# Patient Record
Sex: Male | Born: 1960 | Race: White | Hispanic: No | Smoking: Former smoker
Health system: Southern US, Community
[De-identification: ages and names within clinical notes are randomized; demographics above are authoritative.]

## PROBLEM LIST (undated history)

## (undated) DIAGNOSIS — Z973 Presence of spectacles and contact lenses: Secondary | ICD-10-CM

## (undated) DIAGNOSIS — E7800 Pure hypercholesterolemia, unspecified: Secondary | ICD-10-CM

## (undated) DIAGNOSIS — K219 Gastro-esophageal reflux disease without esophagitis: Secondary | ICD-10-CM

## (undated) DIAGNOSIS — M069 Rheumatoid arthritis, unspecified: Secondary | ICD-10-CM

## (undated) DIAGNOSIS — I219 Acute myocardial infarction, unspecified: Secondary | ICD-10-CM

## (undated) DIAGNOSIS — I10 Essential (primary) hypertension: Secondary | ICD-10-CM

## (undated) DIAGNOSIS — M659 Synovitis and tenosynovitis, unspecified: Secondary | ICD-10-CM

## (undated) DIAGNOSIS — M2560 Stiffness of unspecified joint, not elsewhere classified: Secondary | ICD-10-CM

## (undated) DIAGNOSIS — E119 Type 2 diabetes mellitus without complications: Secondary | ICD-10-CM

## (undated) DIAGNOSIS — I2510 Atherosclerotic heart disease of native coronary artery without angina pectoris: Secondary | ICD-10-CM

## (undated) DIAGNOSIS — F410 Panic disorder [episodic paroxysmal anxiety] without agoraphobia: Secondary | ICD-10-CM

## (undated) DIAGNOSIS — G4730 Sleep apnea, unspecified: Secondary | ICD-10-CM

## (undated) HISTORY — DX: Atherosclerotic heart disease of native coronary artery without angina pectoris: I25.10

## (undated) HISTORY — DX: Rheumatoid arthritis, unspecified: M06.9

## (undated) HISTORY — PX: TENDON REPAIR: SHX5111

---
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
## 2000-02-28 HISTORY — PX: CARDIAC CATHETERIZATION: SHX172

## 2007-02-26 ENCOUNTER — Ambulatory Visit (HOSPITAL_COMMUNITY): Admission: RE | Admit: 2007-02-26 | Discharge: 2007-02-26 | Payer: Self-pay | Admitting: Family Medicine

## 2008-03-10 ENCOUNTER — Ambulatory Visit: Payer: Self-pay | Admitting: Cardiology

## 2008-03-10 ENCOUNTER — Inpatient Hospital Stay (HOSPITAL_COMMUNITY): Admission: AD | Admit: 2008-03-10 | Discharge: 2008-03-13 | Payer: Self-pay | Admitting: Neurology

## 2008-03-11 ENCOUNTER — Encounter (INDEPENDENT_AMBULATORY_CARE_PROVIDER_SITE_OTHER): Payer: Self-pay | Admitting: Neurology

## 2010-03-20 ENCOUNTER — Encounter: Payer: Self-pay | Admitting: Family Medicine

## 2010-06-13 LAB — HEMOGLOBIN A1C
Hgb A1c MFr Bld: 6.7 % — ABNORMAL HIGH (ref 4.6–6.1)
Mean Plasma Glucose: 146 mg/dL

## 2010-06-13 LAB — CARDIAC PANEL(CRET KIN+CKTOT+MB+TROPI)
Relative Index: INVALID (ref 0.0–2.5)
Relative Index: INVALID (ref 0.0–2.5)
Total CK: 88 U/L (ref 7–232)
Total CK: 95 U/L (ref 7–232)
Troponin I: 0.02 ng/mL (ref 0.00–0.06)
Troponin I: <0.01 ng/mL (ref 0.00–0.06)

## 2010-06-13 LAB — CBC
HCT: 36.6 % — ABNORMAL LOW (ref 39.0–52.0)
HCT: 41.1 % (ref 39.0–52.0)
Hemoglobin: 12.4 g/dL — ABNORMAL LOW (ref 13.0–17.0)
MCHC: 33.6 g/dL (ref 30.0–36.0)
MCV: 90.7 fL (ref 78.0–100.0)
RBC: 4.53 MIL/uL (ref 4.22–5.81)
RDW: 13.1 % (ref 11.5–15.5)
WBC: 11.1 10*3/uL — ABNORMAL HIGH (ref 4.0–10.5)

## 2010-06-13 LAB — URINALYSIS, ROUTINE W REFLEX MICROSCOPIC
Bilirubin Urine: NEGATIVE
Glucose, UA: NEGATIVE mg/dL
Ketones, ur: NEGATIVE mg/dL
Leukocytes, UA: NEGATIVE
Leukocytes, UA: NEGATIVE
Protein, ur: NEGATIVE mg/dL
Specific Gravity, Urine: 1.019 (ref 1.005–1.030)
Urobilinogen, UA: 0.2 mg/dL (ref 0.0–1.0)
pH: 6 (ref 5.0–8.0)

## 2010-06-13 LAB — COMPREHENSIVE METABOLIC PANEL
ALT: 25 U/L (ref 0–53)
ALT: 32 U/L (ref 0–53)
AST: 20 U/L (ref 0–37)
Albumin: 3.3 g/dL — ABNORMAL LOW (ref 3.5–5.2)
BUN: 8 mg/dL (ref 6–23)
CO2: 25 mEq/L (ref 19–32)
Calcium: 7.3 mg/dL — ABNORMAL LOW (ref 8.4–10.5)
Calcium: 9.1 mg/dL (ref 8.4–10.5)
Chloride: 104 mEq/L (ref 96–112)
Chloride: 111 mEq/L (ref 96–112)
Creatinine, Ser: 0.62 mg/dL (ref 0.4–1.5)
Creatinine, Ser: 0.63 mg/dL (ref 0.4–1.5)
GFR calc non Af Amer: >60 mL/min (ref >60)
GFR calc non Af Amer: >60 mL/min (ref >60)
Potassium: 3.6 mEq/L (ref 3.5–5.1)
Sodium: 139 mEq/L (ref 135–145)
Total Bilirubin: 0.4 mg/dL (ref 0.3–1.2)
Total Bilirubin: 0.6 mg/dL (ref 0.3–1.2)
Total Protein: 5.7 g/dL — ABNORMAL LOW (ref 6.0–8.3)

## 2010-06-13 LAB — GLUCOSE, CAPILLARY
Glucose-Capillary: 115 mg/dL — ABNORMAL HIGH (ref 70–99)
Glucose-Capillary: 117 mg/dL — ABNORMAL HIGH (ref 70–99)
Glucose-Capillary: 118 mg/dL — ABNORMAL HIGH (ref 70–99)
Glucose-Capillary: 130 mg/dL — ABNORMAL HIGH (ref 70–99)
Glucose-Capillary: 160 mg/dL — ABNORMAL HIGH (ref 70–99)
Glucose-Capillary: 179 mg/dL — ABNORMAL HIGH (ref 70–99)

## 2010-06-13 LAB — DIFFERENTIAL
Basophils Relative: 0 % (ref 0–1)
Lymphocytes Relative: 32 % (ref 12–46)
Lymphs Abs: 3.5 10*3/uL (ref 0.7–4.0)
Neutro Abs: 6.6 10*3/uL (ref 1.7–7.7)
Neutrophils Relative %: 60 % (ref 43–77)

## 2010-06-13 LAB — PROTIME-INR
INR: 1 (ref 0.00–1.49)
INR: 1.1 (ref 0.00–1.49)
Prothrombin Time: 14.5 seconds (ref 11.6–15.2)

## 2010-06-13 LAB — BASIC METABOLIC PANEL
BUN: 10 mg/dL (ref 6–23)
Chloride: 102 mEq/L (ref 96–112)
Creatinine, Ser: 0.68 mg/dL (ref 0.4–1.5)
GFR calc non Af Amer: >60 mL/min (ref >60)
Potassium: 4 mEq/L (ref 3.5–5.1)
Sodium: 136 mEq/L (ref 135–145)

## 2010-06-13 LAB — APTT: aPTT: 28 seconds (ref 24–37)

## 2010-06-13 LAB — URINE MICROSCOPIC-ADD ON

## 2010-06-13 LAB — LIPID PANEL
LDL Cholesterol: 124 mg/dL — ABNORMAL HIGH (ref 0–99)
Total CHOL/HDL Ratio: 8.3 RATIO
VLDL: 50 mg/dL — ABNORMAL HIGH (ref 0–40)

## 2010-06-13 LAB — RAPID URINE DRUG SCREEN, HOSP PERFORMED: Tetrahydrocannabinol: NOT DETECTED

## 2010-06-13 LAB — TROPONIN I: Troponin I: 0.01 ng/mL (ref 0.00–0.06)

## 2010-06-13 LAB — CK TOTAL AND CKMB (NOT AT ARMC): CK, MB: 1.7 ng/mL (ref 0.3–4.0)

## 2010-06-13 LAB — POCT CARDIAC MARKERS
CKMB, poc: 1.1 ng/mL (ref 1.0–8.0)
Myoglobin, poc: 30.2 ng/mL (ref 12–200)
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
## 2013-02-20 ENCOUNTER — Emergency Department (HOSPITAL_COMMUNITY): Payer: BC Managed Care – PPO

## 2013-02-20 ENCOUNTER — Encounter (HOSPITAL_COMMUNITY): Payer: Self-pay | Admitting: Emergency Medicine

## 2013-02-20 ENCOUNTER — Emergency Department (HOSPITAL_COMMUNITY)
Admission: EM | Admit: 2013-02-20 | Discharge: 2013-02-20 | Disposition: A | Discharge status: Home / Self Care | Payer: BC Managed Care – PPO | Attending: Emergency Medicine | Admitting: Emergency Medicine

## 2013-02-20 DIAGNOSIS — R209 Unspecified disturbances of skin sensation: Secondary | ICD-10-CM | POA: Insufficient documentation

## 2013-02-20 DIAGNOSIS — I10 Essential (primary) hypertension: Secondary | ICD-10-CM | POA: Insufficient documentation

## 2013-02-20 DIAGNOSIS — M7989 Other specified soft tissue disorders: Secondary | ICD-10-CM

## 2013-02-20 HISTORY — DX: Essential (primary) hypertension: I10

## 2013-02-20 MED ORDER — IBUPROFEN 800 MG PO TABS
800.0000 mg | ORAL_TABLET | Freq: Once | ORAL | Status: AC
  Administered 2013-02-20: 800 mg via ORAL
  Filled 2013-02-20: qty 1

## 2013-02-20 MED ORDER — HYDROCODONE-ACETAMINOPHEN 5-325 MG PO TABS: 1.0000 | ORAL_TABLET | ORAL | Status: DC | PRN

## 2013-02-20 NOTE — ED Provider Notes (Signed)
CSN: [id number]     Arrival date & time 02/20/13  4098 History   First MD Initiated Contact with Patient 02/20/13 [phone]     Chief Complaint  Patient presents with  . Arm Swelling   (Consider location/radiation/quality/duration/timing/severity/associated sxs/prior Treatment) HPI Comments: 52 year old male presents with 2 weeks of right hand swelling. The swelling is in his thenar eminence. He states there's been no trauma, redness, recent infection, fevers, or obvious cause. There is no weakness. He states he feels numbness in the tips of his thumb, index finger, and middle finger. He states he's been trying Advil and Aleve for the pain and swelling his stay. He states he has not had any chest pain or shortness of breath. The pain was worse this morning however has been and seem to radiate from his hand up to her shoulder. Denies any headache. The pain is currently severe.   Past Medical History  Diagnosis Date  . Hypertension    No past surgical history on file. No family history on file. History  Substance Use Topics  . Smoking status: Not on file  . Smokeless tobacco: Not on file  . Alcohol Use: Not on file    Review of Systems  Respiratory: Negative for shortness of breath.   Cardiovascular: Negative for chest pain.  Gastrointestinal: Negative for vomiting.  Musculoskeletal:       Right hand swelling  Skin: Negative for rash and wound.  Neurological: Positive for numbness. Negative for weakness.  All other systems reviewed and are negative.    Allergies  Review of patient's allergies indicates no known allergies.  Home Medications  No current outpatient prescriptions on file. BP 145/92  Pulse 68  Temp(Src) 98.3 F (36.8 C) (Oral)  Resp 18  Ht 5\' 6"  (1.676 m)  Wt 240 lb (108.863 kg)  BMI 38.76 kg/m2  SpO2 91% Physical Exam  Nursing note and vitals reviewed. Constitutional: He is oriented to person, place, and time. He appears well-developed and well-nourished. No  distress.  HENT:  Head: Normocephalic and atraumatic.  Right Ear: External ear normal.  Left Ear: External ear normal.  Nose: Nose normal.  Eyes: Right eye exhibits no discharge. Left eye exhibits no discharge.  Neck: Neck supple.  Cardiovascular: Normal rate, regular rhythm, normal heart sounds and intact distal pulses.   Pulses:      Radial pulses are 2+ on the right side, and 2+ on the left side.  Pulmonary/Chest: Effort normal and breath sounds normal.  Abdominal: Soft. There is no tenderness.  Musculoskeletal: He exhibits no edema.       Hands: Good range of motion of wrist, and all fingers. Able to make a fist with pain. Normal capillary refill. Patient is able to flex and extend each finger against resistance without difficulty.  Neurological: He is alert and oriented to person, place, and time.  Skin: Skin is warm and dry.    ED Course  Procedures (including critical care time) Labs Review Labs Reviewed - No data to display Imaging Review Dg Hand Complete Right  02/20/2013   CLINICAL DATA:  Right hand pain  EXAM: RIGHT HAND - COMPLETE 3+ VIEW  COMPARISON:  None.  FINDINGS: No acute fracture.  No dislocation.  IMPRESSION: No acute bony pathology.   Electronically Signed   By: Maryclare Bean M.D.   On: 02/20/2013 10:35    EKG Interpretation   None       MDM   1. Hand swelling    Patient with  nonspecific right hand swelling. No signs of infection such as fever, induration, or erythema. No trauma. No fractures noted. His compartments are soft and he has full range of motion of his fingers with no signs of tendon or ligament instability. No localized joint swelling or effusion. He has no swelling past his thenar eminence. This is not of swelling across his any joints, I do not feel there is any risk for blood clot. Will treat with symptom control and f/u with PCP.     Audree Camel, MD 02/20/13 [phone]

## 2013-02-20 NOTE — ED Notes (Signed)
Pt c/o right hand swelling and numbness x1 week. Pt states this morning the symptoms briefly radiated up his right arm to his shoulder. Pt denies chest pain, SOB, fever. Pt has limited movement with right thumb and fingers due to swelling and pain.

## 2013-03-30 DIAGNOSIS — M659 Synovitis and tenosynovitis, unspecified: Secondary | ICD-10-CM

## 2013-03-30 DIAGNOSIS — M65939 Unspecified synovitis and tenosynovitis, unspecified forearm: Secondary | ICD-10-CM

## 2013-03-30 HISTORY — DX: Unspecified synovitis and tenosynovitis, unspecified forearm: M65.939

## 2013-03-30 HISTORY — DX: Synovitis and tenosynovitis, unspecified: M65.9

## 2013-04-04 ENCOUNTER — Encounter (HOSPITAL_BASED_OUTPATIENT_CLINIC_OR_DEPARTMENT_OTHER): Payer: Self-pay | Admitting: *Deleted

## 2013-04-04 DIAGNOSIS — M2560 Stiffness of unspecified joint, not elsewhere classified: Secondary | ICD-10-CM

## 2013-04-04 HISTORY — DX: Stiffness of unspecified joint, not elsewhere classified: M25.60

## 2013-04-04 NOTE — Progress Notes (Signed)
04/04/13 1509  OBSTRUCTIVE SLEEP APNEA  Have you ever been diagnosed with sleep apnea through a sleep study? No  Do you snore loudly (loud enough to be heard through closed doors)?  1  Do you often feel tired, fatigued, or sleepy during the daytime? 1  Has anyone observed you stop breathing during your sleep? 0  Do you have, or are you being treated for high blood pressure? 1  BMI more than 35 kg/m2? 1  Age over 52 years old? 1  Gender: 1  Obstructive Sleep Apnea Score 6  Score 4 or greater  Results sent to PCP (Dr. Elfredia Nevins)

## 2013-04-04 NOTE — Pre-Procedure Instructions (Signed)
To come for BMET and EKG 

## 2013-04-07 ENCOUNTER — Encounter (HOSPITAL_BASED_OUTPATIENT_CLINIC_OR_DEPARTMENT_OTHER)
Admission: RE | Admit: 2013-04-07 | Discharge: 2013-04-07 | Disposition: A | Discharge status: Home / Self Care | Payer: BC Managed Care – PPO | Source: Ambulatory Visit | Attending: Orthopedic Surgery | Admitting: Orthopedic Surgery

## 2013-04-07 ENCOUNTER — Other Ambulatory Visit: Payer: Self-pay | Admitting: Orthopedic Surgery

## 2013-04-07 LAB — BASIC METABOLIC PANEL
BUN: 11 mg/dL (ref 6–23)
CALCIUM: 8.9 mg/dL (ref 8.4–10.5)
CO2: 23 mEq/L (ref 19–32)
Chloride: 100 mEq/L (ref 96–112)
Creatinine, Ser: 0.64 mg/dL (ref 0.50–1.35)
Glucose, Bld: 292 mg/dL — ABNORMAL HIGH (ref 70–99)
POTASSIUM: 4.2 meq/L (ref 3.7–5.3)
SODIUM: 140 meq/L (ref 137–147)

## 2013-04-08 ENCOUNTER — Ambulatory Visit (HOSPITAL_BASED_OUTPATIENT_CLINIC_OR_DEPARTMENT_OTHER)
Admission: RE | Admit: 2013-04-08 | Discharge: 2013-04-08 | Disposition: A | Discharge status: Home / Self Care | Payer: BC Managed Care – PPO | Source: Ambulatory Visit | Attending: Orthopedic Surgery | Admitting: Orthopedic Surgery

## 2013-04-08 ENCOUNTER — Encounter (HOSPITAL_BASED_OUTPATIENT_CLINIC_OR_DEPARTMENT_OTHER): Payer: Self-pay | Admitting: Orthopedic Surgery

## 2013-04-08 ENCOUNTER — Encounter (HOSPITAL_BASED_OUTPATIENT_CLINIC_OR_DEPARTMENT_OTHER): Payer: BC Managed Care – PPO | Admitting: Anesthesiology

## 2013-04-08 ENCOUNTER — Ambulatory Visit (HOSPITAL_BASED_OUTPATIENT_CLINIC_OR_DEPARTMENT_OTHER): Payer: BC Managed Care – PPO | Admitting: Anesthesiology

## 2013-04-08 ENCOUNTER — Encounter (HOSPITAL_BASED_OUTPATIENT_CLINIC_OR_DEPARTMENT_OTHER)
Admission: RE | Disposition: A | Discharge status: Home / Self Care | Payer: Self-pay | Source: Ambulatory Visit | Attending: Orthopedic Surgery

## 2013-04-08 DIAGNOSIS — E7800 Pure hypercholesterolemia, unspecified: Secondary | ICD-10-CM | POA: Insufficient documentation

## 2013-04-08 DIAGNOSIS — F410 Panic disorder [episodic paroxysmal anxiety] without agoraphobia: Secondary | ICD-10-CM | POA: Insufficient documentation

## 2013-04-08 DIAGNOSIS — M65839 Other synovitis and tenosynovitis, unspecified forearm: Secondary | ICD-10-CM | POA: Insufficient documentation

## 2013-04-08 DIAGNOSIS — I10 Essential (primary) hypertension: Secondary | ICD-10-CM | POA: Insufficient documentation

## 2013-04-08 DIAGNOSIS — G5600 Carpal tunnel syndrome, unspecified upper limb: Secondary | ICD-10-CM | POA: Insufficient documentation

## 2013-04-08 DIAGNOSIS — M65849 Other synovitis and tenosynovitis, unspecified hand: Principal | ICD-10-CM

## 2013-04-08 DIAGNOSIS — E119 Type 2 diabetes mellitus without complications: Secondary | ICD-10-CM | POA: Insufficient documentation

## 2013-04-08 HISTORY — DX: Type 2 diabetes mellitus without complications: E11.9

## 2013-04-08 HISTORY — PX: TENOSYNOVECTOMY: SHX6110

## 2013-04-08 HISTORY — DX: Stiffness of unspecified joint, not elsewhere classified: M25.60

## 2013-04-08 HISTORY — DX: Panic disorder (episodic paroxysmal anxiety): F41.0

## 2013-04-08 HISTORY — DX: Pure hypercholesterolemia, unspecified: E78.00

## 2013-04-08 HISTORY — DX: Synovitis and tenosynovitis, unspecified: M65.9

## 2013-04-08 LAB — GLUCOSE, CAPILLARY
Glucose-Capillary: 165 mg/dL — ABNORMAL HIGH (ref 70–99)
Glucose-Capillary: 167 mg/dL — ABNORMAL HIGH (ref 70–99)

## 2013-04-08 LAB — POCT HEMOGLOBIN-HEMACUE
HEMOGLOBIN: 14 g/dL (ref 13.0–17.0)
Hemoglobin: 13.4 g/dL (ref 13.0–17.0)

## 2013-04-08 SURGERY — TENOSYNOVECTOMY
Anesthesia: General | Site: Wrist | Laterality: Right

## 2013-04-08 MED ORDER — PROPOFOL 10 MG/ML IV EMUL
INTRAVENOUS | Status: AC
  Filled 2013-04-08: qty 50

## 2013-04-08 MED ORDER — HYDROMORPHONE HCL PF 1 MG/ML IJ SOLN
INTRAMUSCULAR | Status: AC
  Filled 2013-04-08: qty 1

## 2013-04-08 MED ORDER — LIDOCAINE HCL (CARDIAC) 20 MG/ML IV SOLN
INTRAVENOUS | Status: DC | PRN
  Administered 2013-04-08: 50 mg via INTRAVENOUS

## 2013-04-08 MED ORDER — EPHEDRINE SULFATE 50 MG/ML IJ SOLN
INTRAMUSCULAR | Status: DC | PRN
  Administered 2013-04-08: 10 mg via INTRAVENOUS

## 2013-04-08 MED ORDER — OXYCODONE HCL 5 MG/5ML PO SOLN: 5.0000 mg | Freq: Once | ORAL | Status: AC | PRN

## 2013-04-08 MED ORDER — CHLORHEXIDINE GLUCONATE 4 % EX LIQD: 60.0000 mL | Freq: Once | CUTANEOUS | Status: DC

## 2013-04-08 MED ORDER — DEXAMETHASONE SODIUM PHOSPHATE 10 MG/ML IJ SOLN
INTRAMUSCULAR | Status: DC | PRN
  Administered 2013-04-08: 4 mg via INTRAVENOUS

## 2013-04-08 MED ORDER — SUCCINYLCHOLINE CHLORIDE 20 MG/ML IJ SOLN
INTRAMUSCULAR | Status: AC
  Filled 2013-04-08: qty 1

## 2013-04-08 MED ORDER — BUPIVACAINE HCL (PF) 0.25 % IJ SOLN
INTRAMUSCULAR | Status: DC | PRN
Start: 2013-04-08 — End: 2013-04-08
  Administered 2013-04-08: 8.5 mL

## 2013-04-08 MED ORDER — HYDROMORPHONE HCL PF 1 MG/ML IJ SOLN
0.2500 mg | INTRAMUSCULAR | Status: DC | PRN
  Administered 2013-04-08 (×2): 0.5 mg via INTRAVENOUS

## 2013-04-08 MED ORDER — CHLORHEXIDINE GLUCONATE 4 % EX LIQD
60.0000 mL | Freq: Once | CUTANEOUS | Status: DC
Start: 2013-04-08 — End: 2013-04-08

## 2013-04-08 MED ORDER — OXYCODONE HCL 5 MG PO TABS
5.0000 mg | ORAL_TABLET | Freq: Once | ORAL | Status: AC | PRN
  Administered 2013-04-08: 5 mg via ORAL

## 2013-04-08 MED ORDER — FENTANYL CITRATE 0.05 MG/ML IJ SOLN: 50.0000 ug | INTRAMUSCULAR | Status: DC | PRN

## 2013-04-08 MED ORDER — LACTATED RINGERS IV SOLN
INTRAVENOUS | Status: DC
  Administered 2013-04-08: 20 mL/h via INTRAVENOUS
  Administered 2013-04-08: 11:00:00 via INTRAVENOUS

## 2013-04-08 MED ORDER — FENTANYL CITRATE 0.05 MG/ML IJ SOLN
INTRAMUSCULAR | Status: DC | PRN
  Administered 2013-04-08: 25 ug via INTRAVENOUS
  Administered 2013-04-08: 100 ug via INTRAVENOUS

## 2013-04-08 MED ORDER — MIDAZOLAM HCL 2 MG/2ML IJ SOLN
INTRAMUSCULAR | Status: AC
  Filled 2013-04-08: qty 2

## 2013-04-08 MED ORDER — PROPOFOL 10 MG/ML IV BOLUS
INTRAVENOUS | Status: DC | PRN
  Administered 2013-04-08: 250 mg via INTRAVENOUS

## 2013-04-08 MED ORDER — CEFAZOLIN SODIUM-DEXTROSE 2-3 GM-% IV SOLR
2.0000 g | INTRAVENOUS | Status: AC
  Administered 2013-04-08: 2 g via INTRAVENOUS

## 2013-04-08 MED ORDER — FENTANYL CITRATE 0.05 MG/ML IJ SOLN
INTRAMUSCULAR | Status: AC
  Filled 2013-04-08: qty 6

## 2013-04-08 MED ORDER — ONDANSETRON HCL 4 MG/2ML IJ SOLN
INTRAMUSCULAR | Status: DC | PRN
  Administered 2013-04-08: 4 mg via INTRAVENOUS

## 2013-04-08 MED ORDER — OXYCODONE HCL 5 MG PO TABS
ORAL_TABLET | ORAL | Status: AC
  Filled 2013-04-08: qty 1

## 2013-04-08 MED ORDER — CEFAZOLIN SODIUM-DEXTROSE 2-3 GM-% IV SOLR
INTRAVENOUS | Status: AC
  Filled 2013-04-08: qty 50

## 2013-04-08 MED ORDER — MIDAZOLAM HCL 2 MG/2ML IJ SOLN: 1.0000 mg | INTRAMUSCULAR | Status: DC | PRN

## 2013-04-08 MED ORDER — MIDAZOLAM HCL 2 MG/ML PO SYRP: 12.0000 mg | ORAL_SOLUTION | Freq: Once | ORAL | Status: DC | PRN

## 2013-04-08 MED ORDER — LIDOCAINE HCL (PF) 1 % IJ SOLN
INTRAMUSCULAR | Status: AC
  Filled 2013-04-08: qty 30

## 2013-04-08 MED ORDER — HYDROCODONE-ACETAMINOPHEN 10-325 MG PO TABS: 1.0000 | ORAL_TABLET | Freq: Four times a day (QID) | ORAL | Status: DC | PRN

## 2013-04-08 SURGICAL SUPPLY — 67 items
BAG DECANTER FOR FLEXI CONT (MISCELLANEOUS) IMPLANT
BLADE MINI RND TIP GREEN BEAV (BLADE) IMPLANT
BLADE SURG 15 STRL LF DISP TIS (BLADE) ×1 IMPLANT
BLADE SURG 15 STRL SS (BLADE) ×2
BNDG COHESIVE 3X5 TAN STRL LF (GAUZE/BANDAGES/DRESSINGS) ×3 IMPLANT
BNDG ESMARK 4X9 LF (GAUZE/BANDAGES/DRESSINGS) ×3 IMPLANT
BNDG GAUZE ELAST 4 BULKY (GAUZE/BANDAGES/DRESSINGS) ×3 IMPLANT
CHLORAPREP W/TINT 26ML (MISCELLANEOUS) ×3 IMPLANT
CORDS BIPOLAR (ELECTRODE) ×3 IMPLANT
COVER MAYO STAND STRL (DRAPES) ×3 IMPLANT
COVER TABLE BACK 60X90 (DRAPES) ×3 IMPLANT
CUFF TOURNIQUET SINGLE 18IN (TOURNIQUET CUFF) ×3 IMPLANT
DECANTER SPIKE VIAL GLASS SM (MISCELLANEOUS) IMPLANT
DRAIN TLS ROUND 10FR (DRAIN) IMPLANT
DRAPE EXTREMITY T 121X128X90 (DRAPE) ×3 IMPLANT
DRAPE SURG 17X23 STRL (DRAPES) ×3 IMPLANT
GAUZE SPONGE 4X4 16PLY XRAY LF (GAUZE/BANDAGES/DRESSINGS) IMPLANT
GAUZE XEROFORM 1X8 LF (GAUZE/BANDAGES/DRESSINGS) ×3 IMPLANT
GLOVE BIOGEL PI IND STRL 7.5 (GLOVE) ×1 IMPLANT
GLOVE BIOGEL PI IND STRL 8.5 (GLOVE) ×1 IMPLANT
GLOVE BIOGEL PI INDICATOR 7.5 (GLOVE) ×2
GLOVE BIOGEL PI INDICATOR 8.5 (GLOVE) ×2
GLOVE SURG ORTHO 8.0 STRL STRW (GLOVE) ×3 IMPLANT
GLOVE SURG SS PI 7.0 STRL IVOR (GLOVE) ×9 IMPLANT
GOWN STRL REUS W/ TWL LRG LVL3 (GOWN DISPOSABLE) ×2 IMPLANT
GOWN STRL REUS W/TWL LRG LVL3 (GOWN DISPOSABLE) ×4
GOWN STRL REUS W/TWL XL LVL3 (GOWN DISPOSABLE) ×3 IMPLANT
K-WIRE .035X4 (WIRE) IMPLANT
LOOP VESSEL MAXI BLUE (MISCELLANEOUS) IMPLANT
NEEDLE 27GAX1X1/2 (NEEDLE) ×3 IMPLANT
NEEDLE HYPO 22GX1.5 SAFETY (NEEDLE) IMPLANT
NEEDLE KEITH (NEEDLE) IMPLANT
NS IRRIG 1000ML POUR BTL (IV SOLUTION) ×3 IMPLANT
PACK BASIN DAY SURGERY FS (CUSTOM PROCEDURE TRAY) ×3 IMPLANT
PAD ABD 8X10 STRL (GAUZE/BANDAGES/DRESSINGS) ×3 IMPLANT
PAD CAST 3X4 CTTN HI CHSV (CAST SUPPLIES) ×1 IMPLANT
PADDING CAST ABS 3INX4YD NS (CAST SUPPLIES)
PADDING CAST ABS 4INX4YD NS (CAST SUPPLIES) ×2
PADDING CAST ABS COTTON 3X4 (CAST SUPPLIES) IMPLANT
PADDING CAST ABS COTTON 4X4 ST (CAST SUPPLIES) ×1 IMPLANT
PADDING CAST COTTON 3X4 STRL (CAST SUPPLIES) ×2
SLEEVE SCD COMPRESS KNEE MED (MISCELLANEOUS) ×3 IMPLANT
SPLINT PLASTER CAST XFAST 3X15 (CAST SUPPLIES) IMPLANT
SPLINT PLASTER XTRA FASTSET 3X (CAST SUPPLIES)
SPONGE GAUZE 4X4 12PLY (GAUZE/BANDAGES/DRESSINGS) ×3 IMPLANT
STOCKINETTE 4X48 STRL (DRAPES) ×6 IMPLANT
SUT ETHIBOND 3-0 V-5 (SUTURE) IMPLANT
SUT MERSILENE 2.0 SH NDLE (SUTURE) IMPLANT
SUT MERSILENE 3 0 FS 1 (SUTURE) IMPLANT
SUT MERSILENE 4 0 P 3 (SUTURE) IMPLANT
SUT PROLENE 2 0 SH DA (SUTURE) IMPLANT
SUT SILK 4 0 PS 2 (SUTURE) IMPLANT
SUT STEEL 4 0 V 26 (SUTURE) IMPLANT
SUT VIC AB 3-0 PS1 18 (SUTURE)
SUT VIC AB 3-0 PS1 18XBRD (SUTURE) IMPLANT
SUT VIC AB 4-0 P-3 18XBRD (SUTURE) IMPLANT
SUT VIC AB 4-0 P3 18 (SUTURE)
SUT VICRYL 4-0 PS2 18IN ABS (SUTURE) ×3 IMPLANT
SUT VICRYL RAPID 5 0 P 3 (SUTURE) IMPLANT
SUT VICRYL RAPIDE 4/0 PS 2 (SUTURE) ×6 IMPLANT
SWAB COLLECTION DEVICE MRSA (MISCELLANEOUS) ×3 IMPLANT
SYR BULB 3OZ (MISCELLANEOUS) ×3 IMPLANT
SYR CONTROL 10ML LL (SYRINGE) ×3 IMPLANT
TOWEL OR 17X24 6PK STRL BLUE (TOWEL DISPOSABLE) ×6 IMPLANT
TUBE ANAEROBIC SPECIMEN COL (MISCELLANEOUS) ×3 IMPLANT
TUBE FEEDING 5FR 15 INCH (TUBING) IMPLANT
UNDERPAD 30X30 INCONTINENT (UNDERPADS AND DIAPERS) ×3 IMPLANT

## 2013-04-08 NOTE — Anesthesia Preprocedure Evaluation (Signed)
Anesthesia Evaluation  Patient identified by MRN, date of birth, ID band Patient awake    Reviewed: Allergy & Precautions, H&P , NPO status , Patient's Chart, lab work & pertinent test results  Airway Mallampati: II TM Distance: >3 FB     Dental no notable dental hx. (+) Teeth Intact and Dental Advisory Given   Pulmonary neg pulmonary ROS,  breath sounds clear to auscultation  Pulmonary exam normal       Cardiovascular hypertension, On Medications Rhythm:Regular Rate:Normal     Neuro/Psych negative neurological ROS  negative psych ROS   GI/Hepatic negative GI ROS, Neg liver ROS,   Endo/Other  diabetes, Type 2, Oral Hypoglycemic AgentsMorbid obesity  Renal/GU negative Renal ROS  negative genitourinary   Musculoskeletal   Abdominal   Peds  Hematology negative hematology ROS (+)   Anesthesia Other Findings   Reproductive/Obstetrics negative OB ROS                           Anesthesia Physical Anesthesia Plan  ASA: III  Anesthesia Plan: General   Post-op Pain Management:    Induction: Intravenous  Airway Management Planned: LMA  Additional Equipment:   Intra-op Plan:   Post-operative Plan: Extubation in OR  Informed Consent: I have reviewed the patients History and Physical, chart, labs and discussed the procedure including the risks, benefits and alternatives for the proposed anesthesia with the patient or authorized representative who has indicated his/her understanding and acceptance.   Dental advisory given  Plan Discussed with: CRNA  Anesthesia Plan Comments:         Anesthesia Quick Evaluation

## 2013-04-08 NOTE — Brief Op Note (Signed)
04/08/2013  10:53 AM  PATIENT:  Sylvester Harder  53 y.o. male  PRE-OPERATIVE DIAGNOSIS:  Tenosynovitis Flexors Right Wrist  POST-OPERATIVE DIAGNOSIS:  Tenosynovitis Flexors Right Wrist  PROCEDURE:  Procedure(s): TENOSYNOVECTOMY/FLEXOR TENDONS RIGHT WRIST (Right)  SURGEON:  Surgeon(s) and Role:    * Nicki Reaper, MD - Primary  PHYSICIAN ASSISTANT:   ASSISTANTS: none   ANESTHESIA:   regional and general  EBL:  Total I/O In: 1100 [I.V.:1100] Out: -   BLOOD ADMINISTERED:none  DRAINS: none   LOCAL MEDICATIONS USED:  BUPIVICAINE   SPECIMEN:  Excision  DISPOSITION OF SPECIMEN:  PATHOLOGY  COUNTS:  YES  TOURNIQUET:   Total Tourniquet Time Documented: Upper Arm (Right) - 47 minutes Total: Upper Arm (Right) - 47 minutes   DICTATION: .Other Dictation: Dictation Number [id number]  PLAN OF CARE: Discharge to home after PACU  PATIENT DISPOSITION:  PACU - hemodynamically stable.   Delay start of Ph

## 2013-04-08 NOTE — Op Note (Signed)
Dictation Number [id number]

## 2013-04-08 NOTE — Anesthesia Procedure Notes (Addendum)
Procedure Name: LMA Insertion Date/Time: 04/08/2013 9:51 AM Performed by: Nicki Reaper Pre-anesthesia Checklist: Patient identified, Emergency Drugs available, Suction available and Patient being monitored Patient Re-evaluated:Patient Re-evaluated prior to inductionOxygen Delivery Method: Circle System Utilized Preoxygenation: Pre-oxygenation with 100% oxygen Intubation Type: IV induction Ventilation: Mask ventilation without difficulty LMA: LMA inserted LMA Size: 5.0 Number of attempts: 1 Airway Equipment and Method: bite block Placement Confirmation: positive ETCO2 Tube secured with: Tape Dental Injury: Teeth and Oropharynx as per pre-operative assessment

## 2013-04-08 NOTE — H&P (Signed)
Greg Alvarez is a 53 year-old left-hand dominant male referred by Dr. Gerarda Fraction complaining of pain, swelling in his right hand and wrist.  This has been going on for approximately one month. He has no new history of injury.  He states that he has had a surgery done on flexor tendons to the little finger in 1980, the first in [place], then transferred to [place].  He does not remember who did it.  He complains of constant, severe, sharp, aching type pain.  He states it is gradually getting worse.  He has not had any fevers or chills.  He has no history of thyroid problems, arthritis or gout.  He does have history of diabetes.  There are all negative.  He has tried taking two Aleve a day which has not given him significant relief.  He states that the pain and swelling initially started on the radial side of his hand and now has gone to the ulnar side of his hand. H e has had his lab work done. His C-reactive protein is slightly elevated along with his white count, otherwise his lab work is negative. His nerve conductions reveal carpal tunnel syndrome bilaterally slightly greater left than right but he has markedly positive   He has had no fevers or chills. There is no evidence of infection.   ALLERGIES:   Ativan.  MEDICATIONS:    Lotrel, metformin, Celexa, aspirin, Lipitor.    SURGICAL HISTORY:     Tendon repair, right hand in 1980.  FAMILY MEDICAL HISTORY:    Positive for diabetes, heart disease, high blood pressure and arthritis.  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.    REVIEW OF SYSTEMS:   Positive for glasses, high blood pressure, otherwise negative 14 points.  Greg Alvarez is an 53 y.o. male.   Chief Complaint: Tenosynovitis flexors rt wrist HPI: see above  Past Medical History  Diagnosis Date  . Panic attacks   . Tenosynovitis of wrist 03/2013    right  . Diabetes mellitus type 2, noninsulin dependent   . Hypertension      under control with med., has been on med. x 10 yr.  . High cholesterol   . 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 Appendectomy  1980  . Cardiac catheterization  2002     states no problem - was done in [place]  . Tendon repair Right     hand    Family History  Problem Relation Age of Onset  . Anesthesia problems Sister     hard to wake up post-op   Social History:  reports that he has never smoked. He has never used smokeless tobacco. He reports that he drinks alcohol. He reports that he does not use illicit drugs.  Allergies:  Allergies  Allergen Reactions  . Ativan [Lorazepam] Other (See Comments)    HYPERACTIVITY    No prescriptions prior to admission    Results for orders placed during the hospital encounter of 04/08/13 (from the past 48 hour(s))  BASIC METABOLIC PANEL     Status: Abnormal   Collection Time    04/07/13 11:09 AM      Result Value Range   Sodium 140  137 - 147 mEq/L   Potassium 4.2  3.7 - 5.3 mEq/L   Chloride  100  96 - 112 mEq/L   CO2 23  19 - 32 mEq/L   Glucose, Bld 292 (*) 70 - 99 mg/dL   BUN 11  6 - 23 mg/dL   Creatinine, Ser 0.64  0.50 - 1.35 mg/dL   Calcium 8.9  8.4 - 10.5 mg/dL   GFR calc non Af Amer >90  >90 mL/min   GFR calc Af Amer >90  >90 mL/min   Comment: (NOTE)     The eGFR has been calculated using the CKD EPI equation.     This calculation has not been validated in all clinical situations.     eGFR's persistently <90 mL/min signify possible Chronic Kidney     Disease.    No results found.   Pertinent items are noted in HPI.  Height 5' 6"  (1.676 m), weight 250 lb (113.399 kg).  General appearance: alert, cooperative and appears stated age Head: Normocephalic, without obvious abnormality Neck: no JVD Resp: clear to auscultation bilaterally Cardio: regular rate and rhythm, S1, S2 normal, no murmur, click, rub or  gallop GI: soft, non-tender; bowel sounds normal; no masses,  no organomegaly Extremities: extremities normal, atraumatic, no cyanosis or edema Pulses: 2+ and symmetric Skin: Skin color, texture, turgor normal. No rashes or lesions Neurologic: Grossly normal Inc2ision/Wound: na  Assessment/Plan We have discussed the possibility of a tenosynovectomy with him. The pre, peri and post op course are discussed along with risks and complications.  He is aware there is no guarantee with surgery, possibility of infection, recurrence, injury to arteries, nerves and tendons, incomplete relief of symptoms and dystrophy.  He would like to proceed. He will be scheduled for tenosynovectomy flexor tendons right wrist as an outpatient under regional anesthesia.  Jerrilyn Messinger R 04/08/2013, 4:41 AM

## 2013-04-08 NOTE — Discharge Instructions (Addendum)

## 2013-04-08 NOTE — Transfer of Care (Signed)
Immediate Anesthesia Transfer of Care Note  Patient: Greg Alvarez  Procedure(s) Performed: Procedure(s): TENOSYNOVECTOMY/FLEXOR TENDONS RIGHT WRIST (Right)  Patient Location: PACU  Anesthesia Type:General  Level of Consciousness: awake, sedated, patient cooperative and responds to stimulation  Airway & Oxygen Therapy: Patient Spontanous Breathing and Patient connected to face mask oxygen  Post-op Assessment: Report given to PACU RN, Post -op Vital signs reviewed and stable and Patient moving all extremities  Post vital signs: Reviewed and stable  Complications: No apparent anesthesia complications

## 2013-04-09 NOTE — Anesthesia Postprocedure Evaluation (Signed)
  Anesthesia Post-op Note  Patient: Greg Alvarez  Procedure(s) Performed: Procedure(s): TENOSYNOVECTOMY/FLEXOR TENDONS RIGHT WRIST (Right)  Patient Location: PACU  Anesthesia Type:General  Level of Consciousness: awake and alert   Airway and Oxygen Therapy: Patient Spontanous Breathing  Post-op Pain: moderate  Post-op Assessment: Post-op Vital signs reviewed, Patient's Cardiovascular Status Stable and Respiratory Function Stable  Post-op Vital Signs: Reviewed  Filed Vitals:   04/08/13 1215  BP: 138/83  Pulse: 83  Temp: 36.6 C  Resp: 16    Complications: No apparent anesthesia complications

## 2013-04-09 NOTE — Op Note (Signed)
NAMEARJUNA, DOEDEN NO.:  [id number]  MEDICAL RECORD NO.:  [id number]  LOCATION:                                 FACILITY:  PHYSICIAN:  Cindee Salt, M.D.       DATE OF BIRTH:  04-13-1960  DATE OF PROCEDURE:  04/08/2013 DATE OF DISCHARGE:                              OPERATIVE REPORT   PREOPERATIVE DIAGNOSIS:  Tenosynovitis of flexor tendons, right wrist with carpal tunnel syndrome.  POSTOPERATIVE DIAGNOSIS:  Tenosynovitis of flexor tendons, right wrist with carpal tunnel syndrome.  OPERATION:  Tenosynovectomy flexor tendons, right wrist.  All finger flexors, FPL.  SURGEON:  Cindee Salt, M.D.  ANESTHESIA:  General with local infiltration.  ANESTHESIOLOGIST:  Zenon Mayo, MD  HISTORY:  The patient is a 53 year old male with a history of pain stiffness of his fingers, right hand.  Inability to use it.  MRI showed a very significant tenosynovitis and nerve conductions are positive.  He has elected to undergo surgical decompression to see if this will help resolve symptoms.  He is aware that there is no guarantee with the surgery, possibility of infection; recurrence of injury to arteries, nerves, tendons, incomplete relief of symptoms, dystrophy.  In preoperative area, the patient is seen, the extremity marked by both patient and surgeon.  Antibiotic given.  PROCEDURE IN DETAIL:  The patient was brought to the operating room, where a general anesthetic was carried out without difficulty.  He was prepped using ChloraPrep in supine position, right arm free.  A 3-minute dry time was allowed.  Time-out taken, confirming the patient and procedure.  The limb was exsanguinated with an Esmarch bandage. Tourniquet was placed high on the arm was inflated to 250 mmHg.  The old incision was used from his flexor tendon repair multiple years ago. Extended proximally and distally and carried down through subcutaneous tissue.  A very significant tenosynovitis  was immediately encountered. This was debrided.  The median nerve was protected.  The area of compression was immediately apparent with very significant changes in the nerve itself.  A rongeur to sharp dissection, bipolar were used to do a synovectomy of each of the superficialis profundus tendons to the index, middle, and ring.  Significant scarring was present between the middle and ring finger to the point that it was unable to be determined which tendons went to which the finger as such it was decided to abandon further tenolysis and synovial tissue excision of these and that there was no synovial tissue present between the 2 tendons to guide in attempted separation.  The specimen was sent to Pathology.  Cultures were taken for both aerobic and anaerobic, fungal, and AFB cultures. The wound was copiously irrigated with saline.  The skin was then closed with interrupted 4-0 Vicryl Rapide after closing deep tissue with interrupted 4-0 Vicryl sutures.  A local infiltration of 0.25% Marcaine was given and approximately 9 mL was used.  Sterile compressive dressing and splint to the wrist with the fingers free was applied.  On deflation of the tourniquet, all fingers were immediately pinked.  He was taken to the recovery room for observation in satisfactory condition.  He will  be discharged home to return the [facility] in 1 week on Vicodin.          ______________________________ Cindee Salt, M.D.     GK/MEDQ  D:  04/08/2013  T:  04/09/2013  Job:  [id number]

## 2013-04-10 ENCOUNTER — Encounter (HOSPITAL_BASED_OUTPATIENT_CLINIC_OR_DEPARTMENT_OTHER): Payer: Self-pay | Admitting: Orthopedic Surgery

## 2013-04-10 LAB — WOUND CULTURE: CULTURE: NO GROWTH

## 2013-04-13 LAB — ANAEROBIC CULTURE

## 2013-05-07 LAB — FUNGUS CULTURE W SMEAR: FUNGAL SMEAR: NONE SEEN

## 2013-05-21 LAB — AFB CULTURE WITH SMEAR (NOT AT ARMC): Acid Fast Smear: NONE SEEN

## 2013-05-28 HISTORY — PX: SHOULDER ARTHROSCOPY W/ ROTATOR CUFF REPAIR: SHX2400

## 2013-06-12 ENCOUNTER — Encounter (HOSPITAL_BASED_OUTPATIENT_CLINIC_OR_DEPARTMENT_OTHER): Payer: Self-pay | Admitting: *Deleted

## 2013-06-12 NOTE — Progress Notes (Signed)
Pt was here 2/15-now will stay overnight if needed-to go to [facility] for [facility]met-had ekg 2/15-scored over 4 sleep apnea ,but stated he did well after last surgery To bring all meds and overnight bag

## 2013-06-13 ENCOUNTER — Encounter (HOSPITAL_COMMUNITY)
Admission: RE | Admit: 2013-06-13 | Discharge: 2013-06-13 | Disposition: A | Discharge status: Home / Self Care | Payer: BC Managed Care – PPO | Source: Ambulatory Visit | Attending: Orthopedic Surgery | Admitting: Orthopedic Surgery

## 2013-06-13 ENCOUNTER — Other Ambulatory Visit: Payer: Self-pay | Admitting: Orthopedic Surgery

## 2013-06-13 DIAGNOSIS — Z01812 Encounter for preprocedural laboratory examination: Secondary | ICD-10-CM | POA: Insufficient documentation

## 2013-06-13 LAB — BASIC METABOLIC PANEL
BUN: 12 mg/dL (ref 6–23)
CHLORIDE: 97 meq/L (ref 96–112)
CO2: 28 meq/L (ref 19–32)
Calcium: 10.2 mg/dL (ref 8.4–10.5)
Creatinine, Ser: 0.66 mg/dL (ref 0.50–1.35)
GFR calc Af Amer: >90 mL/min (ref >90)
GFR calc non Af Amer: >90 mL/min (ref >90)
Glucose, Bld: 143 mg/dL — ABNORMAL HIGH (ref 70–99)
Potassium: 4.7 mEq/L (ref 3.7–5.3)
Sodium: 139 mEq/L (ref 137–147)

## 2013-06-16 NOTE — H&P (Signed)
Greg Alvarez is an 53 y.o. male.   Chief Complaint: c/o chronic and progressive left shoulder pain and decreased ROM HPI: Greg Alvarez was referred by my partner Greg Alvarez for management of a painful left shoulder predicament. 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 He has had increasing pain in his left shoulder dating back to December. He is currently under the care of Greg Alvarez for bilateral tenosynovitis of his flexors with synovectomy on the right 04-18-13. He is anticipating surgery on the left. He has noted increasing pain in the left shoulder where he can no longer sleep on the left side at night. He was seen at your office and provided analgesic injection. He has noted some weakness of his left grip. He could not recall a specific trauma to his shoulder but has noted progressive weakness and impaired motion in scaption, elevation and external rotation.    Past Medical History  Diagnosis Date  . Panic attacks   . Tenosynovitis of wrist 03/2013    right  . Diabetes mellitus type 2, noninsulin dependent   . Hypertension     under control with med., has been on med. x 10 yr.  . High cholesterol   . 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 Wears glasses     Past Surgical History  Procedure Laterality Date  . Appendectomy  1980  . Cardiac catheterization  2002     states no problem - was done in [place]  . Tendon repair Right     hand  . Tenosynovectomy Right 04/08/2013    Procedure: TENOSYNOVECTOMY/FLEXOR TENDONS RIGHT WRIST;  Surgeon: Nicki Reaper, MD;  Location: [facility];  Service: Orthopedics;  Laterality: Right;    Family History  Problem Relation Age of Onset  . Anesthesia problems Sister     hard to wake up post-op   Social History:  reports that he has  never smoked. He has never used smokeless tobacco. He reports that he drinks alcohol. He reports that he does not use illicit drugs.  Allergies:  Allergies  Allergen Reactions  . Ativan [Lorazepam] Other (See Comments)    HYPERACTIVITY    No prescriptions prior to admission    No results found for this or any previous visit (from the past 48 hour(s)).  No results found.   Pertinent items are noted in HPI.  Height 5\' 6"  (1.676 m), weight 113.399 kg (250 lb).  General appearance: alert Head: Normocephalic, without obvious abnormality Neck: supple, symmetrical, trachea midline Resp: clear to auscultation bilaterally Cardio: regular rate and rhythm GI: normal findings: bowel sounds normal Extremities:  His shoulder ROM reveals combined elevation right shoulder 165, left shoulder 160, external rotation at 90 degrees abduction right shoulder, left shoulder 85 degrees. He has pain with rapid abduction, scaption and rotation of his left shoulder at 90 degrees abduction. He has a positive impingement sign and positive drop sign. He has weakness to resistance and scaption, forward flexion and external rotation.   Plain films of his shoulder demonstrate AC degenerative change on the left and preservation of his glenohumeral articulation. There is no calcification in the rotator cuff.   His exam is compatible with subacromial impingement and rotator cuff tear The MRI confirms AC arthropathy and  a very sick supraspinatus rotator cuff tendon. He has a partial thickness tear. He has chronic impingement.    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  Plan: To the OR for left SA with SAD/DCR and RC repair as needed.The procedure, risks,benefits and post-op course were discussed with the patient at length and they were in agreement with the plan.  Greg Alvarez  Greg Alvarez 06/16/2013, 12:31 PM  H&P documentation: 06/17/2013  -History and Physical Reviewed  -Patient has been re-examined  -No change in the plan of care  Greg Forster, MD

## 2013-06-17 ENCOUNTER — Encounter (HOSPITAL_BASED_OUTPATIENT_CLINIC_OR_DEPARTMENT_OTHER): Payer: Self-pay

## 2013-06-17 ENCOUNTER — Encounter (HOSPITAL_BASED_OUTPATIENT_CLINIC_OR_DEPARTMENT_OTHER)
Admission: RE | Disposition: A | Discharge status: Home / Self Care | Payer: Self-pay | Source: Ambulatory Visit | Attending: Orthopedic Surgery

## 2013-06-17 ENCOUNTER — Encounter (HOSPITAL_BASED_OUTPATIENT_CLINIC_OR_DEPARTMENT_OTHER): Payer: BC Managed Care – PPO | Admitting: Anesthesiology

## 2013-06-17 ENCOUNTER — Ambulatory Visit (HOSPITAL_BASED_OUTPATIENT_CLINIC_OR_DEPARTMENT_OTHER): Payer: BC Managed Care – PPO | Admitting: Anesthesiology

## 2013-06-17 ENCOUNTER — Ambulatory Visit (HOSPITAL_BASED_OUTPATIENT_CLINIC_OR_DEPARTMENT_OTHER)
Admission: RE | Admit: 2013-06-17 | Discharge: 2013-06-17 | Disposition: A | Discharge status: Home / Self Care | Payer: BC Managed Care – PPO | Source: Ambulatory Visit | Attending: Orthopedic Surgery | Admitting: Orthopedic Surgery

## 2013-06-17 DIAGNOSIS — F410 Panic disorder [episodic paroxysmal anxiety] without agoraphobia: Secondary | ICD-10-CM | POA: Insufficient documentation

## 2013-06-17 DIAGNOSIS — E7800 Pure hypercholesterolemia, unspecified: Secondary | ICD-10-CM | POA: Insufficient documentation

## 2013-06-17 DIAGNOSIS — M25819 Other specified joint disorders, unspecified shoulder: Secondary | ICD-10-CM | POA: Insufficient documentation

## 2013-06-17 DIAGNOSIS — X58XXXA Exposure to other specified factors, initial encounter: Secondary | ICD-10-CM | POA: Insufficient documentation

## 2013-06-17 DIAGNOSIS — M67919 Unspecified disorder of synovium and tendon, unspecified shoulder: Secondary | ICD-10-CM | POA: Insufficient documentation

## 2013-06-17 DIAGNOSIS — M19019 Primary osteoarthritis, unspecified shoulder: Secondary | ICD-10-CM | POA: Insufficient documentation

## 2013-06-17 DIAGNOSIS — S43439A Superior glenoid labrum lesion of unspecified shoulder, initial encounter: Secondary | ICD-10-CM | POA: Insufficient documentation

## 2013-06-17 DIAGNOSIS — M7580 Other shoulder lesions, unspecified shoulder: Secondary | ICD-10-CM

## 2013-06-17 DIAGNOSIS — E119 Type 2 diabetes mellitus without complications: Secondary | ICD-10-CM | POA: Insufficient documentation

## 2013-06-17 DIAGNOSIS — M719 Bursopathy, unspecified: Principal | ICD-10-CM | POA: Insufficient documentation

## 2013-06-17 DIAGNOSIS — I10 Essential (primary) hypertension: Secondary | ICD-10-CM | POA: Insufficient documentation

## 2013-06-17 HISTORY — DX: Presence of spectacles and contact lenses: Z97.3

## 2013-06-17 LAB — GLUCOSE, CAPILLARY
GLUCOSE-CAPILLARY: 132 mg/dL — AB (ref 70–99)
Glucose-Capillary: 120 mg/dL — ABNORMAL HIGH (ref 70–99)

## 2013-06-17 LAB — POCT HEMOGLOBIN-HEMACUE: HEMOGLOBIN: 12 g/dL — AB (ref 13.0–17.0)

## 2013-06-17 SURGERY — SHOULDER ARTHROSCOPY WITH SUBACROMIAL DECOMPRESSION AND DISTAL CLAVICLE EXCISION
Anesthesia: General | Site: Shoulder | Laterality: Left

## 2013-06-17 MED ORDER — FENTANYL CITRATE 0.05 MG/ML IJ SOLN
INTRAMUSCULAR | Status: AC
  Filled 2013-06-17: qty 6

## 2013-06-17 MED ORDER — MIDAZOLAM HCL 2 MG/2ML IJ SOLN
INTRAMUSCULAR | Status: AC
  Filled 2013-06-17: qty 2

## 2013-06-17 MED ORDER — OXYCODONE HCL 5 MG PO TABS: 5.0000 mg | ORAL_TABLET | Freq: Once | ORAL | Status: DC | PRN

## 2013-06-17 MED ORDER — ONDANSETRON HCL 4 MG/2ML IJ SOLN: 4.0000 mg | Freq: Once | INTRAMUSCULAR | Status: DC | PRN

## 2013-06-17 MED ORDER — ROPIVACAINE HCL 5 MG/ML IJ SOLN
INTRAMUSCULAR | Status: DC | PRN
  Administered 2013-06-17: 27 mL via PERINEURAL

## 2013-06-17 MED ORDER — FENTANYL CITRATE 0.05 MG/ML IJ SOLN
50.0000 ug | INTRAMUSCULAR | Status: DC | PRN
  Administered 2013-06-17: 100 ug via INTRAVENOUS

## 2013-06-17 MED ORDER — MIDAZOLAM HCL 5 MG/5ML IJ SOLN
INTRAMUSCULAR | Status: DC | PRN
  Administered 2013-06-17: 2 mg via INTRAVENOUS

## 2013-06-17 MED ORDER — EPHEDRINE SULFATE 50 MG/ML IJ SOLN
INTRAMUSCULAR | Status: DC | PRN
  Administered 2013-06-17 (×4): 10 mg via INTRAVENOUS

## 2013-06-17 MED ORDER — MIDAZOLAM HCL 2 MG/2ML IJ SOLN
1.0000 mg | INTRAMUSCULAR | Status: DC | PRN
  Administered 2013-06-17: 2 mg via INTRAVENOUS

## 2013-06-17 MED ORDER — LACTATED RINGERS IV SOLN
INTRAVENOUS | Status: DC
  Administered 2013-06-17 (×2): via INTRAVENOUS

## 2013-06-17 MED ORDER — CEPHALEXIN 500 MG PO CAPS: 500.0000 mg | ORAL_CAPSULE | Freq: Three times a day (TID) | ORAL | Status: DC

## 2013-06-17 MED ORDER — HYDROMORPHONE HCL 2 MG PO TABS: ORAL_TABLET | ORAL | Status: DC

## 2013-06-17 MED ORDER — CEFAZOLIN SODIUM-DEXTROSE 2-3 GM-% IV SOLR
2.0000 g | INTRAVENOUS | Status: AC
  Administered 2013-06-17: 2 g via INTRAVENOUS

## 2013-06-17 MED ORDER — PROPOFOL 10 MG/ML IV BOLUS
INTRAVENOUS | Status: DC | PRN
  Administered 2013-06-17: 50 mg via INTRAVENOUS
  Administered 2013-06-17: 250 mg via INTRAVENOUS

## 2013-06-17 MED ORDER — FENTANYL CITRATE 0.05 MG/ML IJ SOLN
INTRAMUSCULAR | Status: DC | PRN
  Administered 2013-06-17: 100 ug via INTRAVENOUS

## 2013-06-17 MED ORDER — FENTANYL CITRATE 0.05 MG/ML IJ SOLN
INTRAMUSCULAR | Status: AC
  Filled 2013-06-17: qty 2

## 2013-06-17 MED ORDER — PROPOFOL 10 MG/ML IV BOLUS
INTRAVENOUS | Status: AC
  Filled 2013-06-17: qty 20

## 2013-06-17 MED ORDER — CHLORHEXIDINE GLUCONATE 4 % EX LIQD: 60.0000 mL | Freq: Once | CUTANEOUS | Status: DC

## 2013-06-17 MED ORDER — HYDROMORPHONE HCL PF 1 MG/ML IJ SOLN: 0.2500 mg | INTRAMUSCULAR | Status: DC | PRN

## 2013-06-17 MED ORDER — OXYCODONE HCL 5 MG/5ML PO SOLN: 5.0000 mg | Freq: Once | ORAL | Status: DC | PRN

## 2013-06-17 MED ORDER — SUCCINYLCHOLINE CHLORIDE 20 MG/ML IJ SOLN
INTRAMUSCULAR | Status: DC | PRN
  Administered 2013-06-17: 100 mg via INTRAVENOUS

## 2013-06-17 MED ORDER — CEFAZOLIN SODIUM-DEXTROSE 2-3 GM-% IV SOLR
INTRAVENOUS | Status: AC
  Filled 2013-06-17: qty 50

## 2013-06-17 MED ORDER — SODIUM CHLORIDE 0.9 % IR SOLN
Status: DC | PRN
  Administered 2013-06-17: 8000 mL

## 2013-06-17 MED ORDER — LIDOCAINE HCL (CARDIAC) 10 MG/ML IV SOLN
INTRAVENOUS | Status: DC | PRN
  Administered 2013-06-17: 100 mg via INTRAVENOUS

## 2013-06-17 SURGICAL SUPPLY — 76 items
BANDAGE ADH SHEER 1  50/CT (GAUZE/BANDAGES/DRESSINGS) IMPLANT
BLADE AVERAGE 25X9 (BLADE) IMPLANT
BLADE CUTTER MENIS 5.5 (BLADE) ×2 IMPLANT
BLADE SURG 15 STRL LF DISP TIS (BLADE) ×2 IMPLANT
BLADE SURG 15 STRL SS (BLADE) ×2
BUR EGG 3PK/BX (BURR) IMPLANT
BUR OVAL 6.0 (BURR) ×2 IMPLANT
CANISTER SUCT 3000ML (MISCELLANEOUS) IMPLANT
CANNULA TWIST IN 8.25X7CM (CANNULA) IMPLANT
CLEANER CAUTERY TIP 5X5 PAD (MISCELLANEOUS) IMPLANT
CUTTER MENISCUS  4.2MM (BLADE) ×1
CUTTER MENISCUS 4.2MM (BLADE) ×1 IMPLANT
DECANTER SPIKE VIAL GLASS SM (MISCELLANEOUS) IMPLANT
DRAPE INCISE IOBAN 66X45 STRL (DRAPES) ×2 IMPLANT
DRAPE STERI 35X30 U-POUCH (DRAPES) ×2 IMPLANT
DRAPE SURG 17X23 STRL (DRAPES) ×2 IMPLANT
DRAPE U-SHAPE 47X51 STRL (DRAPES) ×2 IMPLANT
DRAPE U-SHAPE 76X120 STRL (DRAPES) ×4 IMPLANT
DRSG PAD ABDOMINAL 8X10 ST (GAUZE/BANDAGES/DRESSINGS) ×2 IMPLANT
DURAPREP 26ML APPLICATOR (WOUND CARE) ×2 IMPLANT
ELECT REM PT RETURN 9FT ADLT (ELECTROSURGICAL) ×2
ELECTRODE REM PT RTRN 9FT ADLT (ELECTROSURGICAL) ×1 IMPLANT
GLOVE BIO SURGEON STRL SZ 6.5 (GLOVE) ×2 IMPLANT
GLOVE BIOGEL M STRL SZ7.5 (GLOVE) ×2 IMPLANT
GLOVE BIOGEL PI IND STRL 7.0 (GLOVE) ×2 IMPLANT
GLOVE BIOGEL PI IND STRL 8 (GLOVE) ×2 IMPLANT
GLOVE BIOGEL PI INDICATOR 7.0 (GLOVE) ×2
GLOVE BIOGEL PI INDICATOR 8 (GLOVE) ×2
GLOVE ECLIPSE 6.5 STRL STRAW (GLOVE) ×2 IMPLANT
GLOVE EXAM NITRILE MD LF STRL (GLOVE) ×2 IMPLANT
GLOVE ORTHO TXT STRL SZ7.5 (GLOVE) ×2 IMPLANT
GOWN STRL REUS W/TWL XL LVL4 (GOWN DISPOSABLE) ×4 IMPLANT
MANIFOLD NEPTUNE II (INSTRUMENTS) ×2 IMPLANT
NDL SUT 6 .5 CRC .975X.05 MAYO (NEEDLE) IMPLANT
NEEDLE MAYO TAPER (NEEDLE)
NEEDLE MINI RC 24MM (NEEDLE) IMPLANT
NEEDLE SCORPION (NEEDLE) IMPLANT
PACK ARTHROSCOPY DSU (CUSTOM PROCEDURE TRAY) ×2 IMPLANT
PACK BASIN DAY SURGERY FS (CUSTOM PROCEDURE TRAY) ×2 IMPLANT
PAD CLEANER CAUTERY TIP 5X5 (MISCELLANEOUS)
PASSER SUT SWANSON 36MM LOOP (INSTRUMENTS) IMPLANT
PENCIL BUTTON HOLSTER BLD 10FT (ELECTRODE) IMPLANT
SLEEVE SCD COMPRESS KNEE MED (MISCELLANEOUS) ×2 IMPLANT
SLING ARM LRG ADULT FOAM STRAP (SOFTGOODS) IMPLANT
SLING ARM MED ADULT FOAM STRAP (SOFTGOODS) IMPLANT
SLING ARM XL FOAM STRAP (SOFTGOODS) ×2 IMPLANT
SPONGE GAUZE 4X4 12PLY (GAUZE/BANDAGES/DRESSINGS) ×2 IMPLANT
SPONGE LAP 4X18 X RAY DECT (DISPOSABLE) ×2 IMPLANT
STRIP CLOSURE SKIN 1/2X4 (GAUZE/BANDAGES/DRESSINGS) ×2 IMPLANT
SUCTION FRAZIER TIP 10 FR DISP (SUCTIONS) IMPLANT
SUT FIBERWIRE #2 38 T-5 BLUE (SUTURE)
SUT FIBERWIRE 3-0 18 TAPR NDL (SUTURE)
SUT PROLENE 1 CT (SUTURE) IMPLANT
SUT PROLENE 3 0 PS 2 (SUTURE) ×2 IMPLANT
SUT TIGER TAPE 7 IN WHITE (SUTURE) IMPLANT
SUT VIC AB 0 CT1 27 (SUTURE)
SUT VIC AB 0 CT1 27XBRD ANBCTR (SUTURE) IMPLANT
SUT VIC AB 0 SH 27 (SUTURE) IMPLANT
SUT VIC AB 2-0 SH 27 (SUTURE)
SUT VIC AB 2-0 SH 27XBRD (SUTURE) IMPLANT
SUT VIC AB 3-0 SH 27 (SUTURE)
SUT VIC AB 3-0 SH 27X BRD (SUTURE) IMPLANT
SUT VIC AB 3-0 X1 27 (SUTURE) IMPLANT
SUTURE FIBERWR #2 38 T-5 BLUE (SUTURE) IMPLANT
SUTURE FIBERWR 3-0 18 TAPR NDL (SUTURE) IMPLANT
SYR 3ML 23GX1 SAFETY (SYRINGE) IMPLANT
SYR BULB 3OZ (MISCELLANEOUS) IMPLANT
TAPE FIBER 2MM 7IN #2 BLUE (SUTURE) IMPLANT
TAPE PAPER 3X10 WHT MICROPORE (GAUZE/BANDAGES/DRESSINGS) ×2 IMPLANT
TOWEL OR 17X24 6PK STRL BLUE (TOWEL DISPOSABLE) ×4 IMPLANT
TOWEL OR NON WOVEN STRL DISP B (DISPOSABLE) ×2 IMPLANT
TUBE CONNECTING 20X1/4 (TUBING) ×2 IMPLANT
TUBING ARTHROSCOPY IRRIG 16FT (MISCELLANEOUS) ×2 IMPLANT
WAND STAR VAC 90 (SURGICAL WAND) ×2 IMPLANT
WATER STERILE IRR 1000ML POUR (IV SOLUTION) ×2 IMPLANT
YANKAUER SUCT BULB TIP NO VENT (SUCTIONS) IMPLANT

## 2013-06-17 NOTE — Progress Notes (Signed)
Assisted Dr. Crews with left, ultrasound guided, interscalene  block. Side rails up, monitors on throughout procedure. See vital signs in flow sheet. Tolerated Procedure well. 

## 2013-06-17 NOTE — Anesthesia Preprocedure Evaluation (Signed)
Anesthesia Evaluation  Patient identified by MRN, date of birth, ID band Patient awake    Reviewed: Allergy & Precautions, H&P , NPO status , Patient's Chart, lab work & pertinent test results  Airway Mallampati: I TM Distance: >3 FB Neck ROM: Full    Dental  (+) Teeth Intact, Dental Advisory Given   Pulmonary  breath sounds clear to auscultation        Cardiovascular hypertension, Pt. on medications Rhythm:Regular Rate:Normal     Neuro/Psych    GI/Hepatic   Endo/Other  diabetes, Well Controlled, Type 2, Oral Hypoglycemic AgentsMorbid obesity  Renal/GU      Musculoskeletal   Abdominal   Peds  Hematology   Anesthesia Other Findings   Reproductive/Obstetrics                           Anesthesia Physical Anesthesia Plan  ASA: III  Anesthesia Plan: General   Post-op Pain Management:    Induction: Intravenous  Airway Management Planned: Oral ETT  Additional Equipment:   Intra-op Plan:   Post-operative Plan: Extubation in OR  Informed Consent: I have reviewed the patients History and Physical, chart, labs and discussed the procedure including the risks, benefits and alternatives for the proposed anesthesia with the patient or authorized representative who has indicated his/her understanding and acceptance.   Dental advisory given  Plan Discussed with: CRNA and Anesthesiologist  Anesthesia Plan Comments:         Anesthesia Quick Evaluation

## 2013-06-17 NOTE — Op Note (Signed)
NAMESOSAIA, Alvarez                ACCOUNT NO.:  [id number]  MEDICAL RECORD NO.:  [id number]  LOCATION:                                 FACILITY:  PHYSICIAN:  Katy Fitch. Deborra Phegley, M.D. DATE OF BIRTH:  1961/01/16  DATE OF PROCEDURE:  06/17/2013 DATE OF DISCHARGE:                              OPERATIVE REPORT   PREOPERATIVE DIAGNOSES: 1. MRI-documented severe acromioclavicular arthropathy with     subacromial impingement and sub-distal clavicle impingement, left     shoulder. 2. Rotator cuff tendinopathy of upper subscapularis and anterior     supraspinatus, rule out full-thickness tear of upper subscapularis     based on MRI findings.  POSTOPERATIVE DIAGNOSIS:  1: Tendinopathy of upper subscapularis without evidence of a complete tear on direct inspection with posterior shift test and use of a nerve hook to palpate the subscapularis tendon.  2:  Fundamentally intact supraspinatus / infraspinatus rotator cuff tendons with identification of very significant acromioclavicular arthropathy and prominent inferior clavicle/medial acromion.  3:  Type 1 SLAP lesion / labral tearing.  OPERATIONS: 1. Diagnostic arthroscopy of left glenohumeral joint with labral     debridement, careful examination of subscapularis, supraspinatus,     infraspinatus, and long head of biceps. 2. Subacromial bursoscopy with release of coracoacromial ligament,     bursectomy, and inspection of acromioclavicular joint. 3. Arthroscopic resection of distal clavicle.  OPERATING SURGEON:  Katy Fitch. Aristide Waggle, MD  ASSISTANT:  Marveen Reeks Dasnoit, PA  ANESTHESIA:  General by endotracheal technique, supplemented by a ropivacaine left plexus block.  SUPERVISING ANESTHESIOLOGIST:  Sheldon Silvan, MD  INDICATIONS:  Greg Alvarez is a 53 year old gentleman, referred through the courtesy of Dr. Cindee Salt for evaluation and management of a painful left shoulder.  Greg Alvarez had been unable to achieve pain relief with  evaluation by Dr. Merlyn Lot and subsequent physical therapy.  He was referred for evaluation of possible rotator cuff impairment. Clinical examination revealed signs of impingement, weakness of abduction, scaption, and extension.  He was sent for MRI of his shoulder, this was obtained at [facility].  This revealed very severe AC joint arthritis with inferior projecting osteophytes in the distal clavicle and medial acromion.  He had abnormal signal in the upper subscapularis at the lesser tuberosity and some abnormal signal in his supraspinatus.  There was no evidence for retracted rotator cuff tears.  Due to failure to respond to nonoperative measures, we advised Greg Alvarez to undergo diagnostic arthroscopy at this time followed by arthroscopic subacromial decompression, distal clavicle resection, and repair of rotator cuff pathology is identified.  Preoperatively, he was reminded of the potential risks and benefits of surgery.  Questions were invited and answered in detail.  Dr. Ivin Booty provided detailed anesthesia informed consent.  He recommended general endotracheal anesthesia supplemented by ropivacaine plexus block.  The block was placed in the holding area with the ultrasonic guidance without complication.  DESCRIPTION OF PROCEDURE:  Greg Alvarez was brought to room 2 of the [facility] and placed in supine position on the operating table.  Under Dr. Ivin Booty' direct supervision, general endotracheal anesthesia was induced.  He was carefully positioned in the beach chair position  with the aid of a torso and head holder designed for shoulder arthroscopy. The entire left arm and forequarter prepped with DuraPrep and draped with impervious arthroscopy drapes.  Great care was taken to prevent any compression around his waist due to safety belts or his gown.  We took great care to pad all bony prominences to protect the peroneal nerves and ulnar nerves.   Passive compression devices were applied to his legs for deep vein thrombosis prevention.  2 g of Ancef were administered as an IV prophylactic antibiotic.  Procedure commenced with a routine surgical time-out.  We then instrumented the shoulder from an anterior approach with a switching stick and placed the scope in the posterior viewing portal with blunt technique. Diagnostic arthroscopy revealed intact hyaline articular cartilage surfaces of the humeral head and glenoid.  The labrum was frayed and torn from approximately 11 o'clock posteriorly to 2 o'clock anteriorly. The origin of the long head of the biceps was sound.  There was quite a bit of synovitis surrounding the subscapularis.  An anterior portal was created under direct vision followed by use of the suction shaver to debride synovitis from the subscapularis.  There was some delamination of the subscapularis in the upper cm.  We followed this to the lesser tuberosity, the subscapularis appeared to be intact at its insertion.  We performed a posterior shift test in the manner of [place] and did not see the footprint of the subscapularis was peeling back in a partial articular surface tear manner.  I used a nerve hook to palpate this carefully and once again found a fundamentally sound insertion of subscapularis despite the appearance of the tendon on the preoperative MRI.  The supraspinatus and infraspinatus tendons were carefully inspected and found to be intact on direct inspection.  The long head of the biceps was normal through the rotator interval into the intertubercular groove. The inferior recess was inspected.  No loose bodies or other pathology noted.  The scope was then removed from the glenohumeral joint and placed in the subacromial space.  There was florid bursitis.  After bursectomy, the coracoacromial ligament was released.  The anatomy of the coracoacromial arch was studied.  This was a type 3 acromion.   This was leveled to a type 1 morphology with a suction burr and shaver.  The capsule of AC joint was released and meniscus fragments removed with the suction shaver.  The degenerative distal clavicle was noted.  This was subsequently resected with a suction burr approximately 8 mm.  Care was taken to perform a circumferential dissection while preserving the Dorsal, anterior and posterior capsule.  Hemostasis was achieved with bipolar cautery.  We then meticulously inspected the cuff.  I could find no evidence of a bursal-sided cuff tear.  Given our findings on direct inspection, we elected not to perform any repair.  The goal of this effort is primarily decompression.  Our hope for Greg Alvarez is that his cuff will repair after decompression with a proper rehabilitation protocol.  The scope was removed and the portal sites repaired with intradermal 3-0 Prolene and Steri-Strips.  A compressive dressing was applied with sterile gauze, ABD pad, and paper tape.  We will discharge him home under the care of his family.  We will ask him to return to the office in 24 hours for a dressing change, application of Tegaderm, initiation of range of motion program.     Katy Fitch. Greg Alvarez, M.D.     RVS/MEDQ  D:  06/17/2013  T:  06/17/2013  Job:  [id number]

## 2013-06-17 NOTE — Discharge Instructions (Addendum)
Hand Center Instructions °Hand Surgery ° °Wound Care: °Keep your hand elevated above the level of your heart.  Do not allow it to dangle by your side.  Keep the dressing dry and do not remove it unless your doctor advises you to do so.  He will usually change it at the time of your post-op visit.  Moving your fingers is advised to stimulate circulation but will depend on the site of your surgery.  If you have a splint applied, your doctor will advise you regarding movement. ° °Activity: °Do not drive or operate machinery today.  Rest today and then you may return to your normal activity and work as indicated by your physician. ° °Diet:  °Drink liquids today or eat a light diet.  You may resume a regular diet tomorrow.   ° °General expectations: °Pain for two to three days. °Fingers may become slightly swollen. ° °Call your doctor if any of the following occur: °Severe pain not relieved by pain medication. °Elevated temperature. °Dressing soaked with blood. °Inability to move fingers. °White or bluish color to fingers. ° ° ° °Post Anesthesia Home Care Instructions ° °Activity: °Get plenty of rest for the remainder of the day. A responsible adult should stay with you for 24 hours following the procedure.  °For the next 24 hours, DO NOT: °-Drive a car °-Operate machinery °-Drink alcoholic beverages °-Take any medication unless instructed by your physician °-Make any legal decisions or sign important papers. ° °Meals: °Start with liquid foods such as gelatin or soup. Progress to regular foods as tolerated. Avoid greasy, spicy, heavy foods. If nausea and/or vomiting occur, drink only clear liquids until the nausea and/or vomiting subsides. Call your physician if vomiting continues. ° °Special Instructions/Symptoms: °Your throat may feel dry or sore from the anesthesia or the breathing tube placed in your throat during surgery. If this causes discomfort, gargle with warm salt water. The discomfort should disappear within  24 hours. ° ° °Regional Anesthesia Blocks ° °1. Numbness or the inability to move the "blocked" extremity may last from 3-48 hours after placement. The length of time depends on the medication injected and your individual response to the medication. If the numbness is not going away after 48 hours, call your surgeon. ° °2. The extremity that is blocked will need to be protected until the numbness is gone and the  Strength has returned. Because you cannot feel it, you will need to take extra care to avoid injury. Because it may be weak, you may have difficulty moving it or using it. You may not know what position it is in without looking at it while the block is in effect. ° °3. For blocks in the legs and feet, returning to weight bearing and walking needs to be done carefully. You will need to wait until the numbness is entirely gone and the strength has returned. You should be able to move your leg and foot normally before you try and bear weight or walk. You will need someone to be with you when you first try to ensure you do not fall and possibly risk injury. ° °4. Bruising and tenderness at the needle site are common side effects and will resolve in a few days. ° °5. Persistent numbness or new problems with movement should be communicated to the surgeon or the [facility] ([phone])/ [facility] ([phone]). ° ° °Call your surgeon if you experience:  ° °1.  Fever over 101.0. °2.  Inability   to urinate. °3.  Nausea and/or vomiting. °4.  Extreme swelling or bruising at the surgical site. °5.  Continued bleeding from the incision. °6.  Increased pain, redness or drainage from the incision. °7.  Problems related to your pain medication. ° °

## 2013-06-17 NOTE — Anesthesia Postprocedure Evaluation (Signed)
  Anesthesia Post-op Note  Patient: Greg Alvarez  Procedure(s) Performed: Procedure(s): LEFT SHOULDER ARTHROSCOPY WITH SUBACROMIAL DECOMPRESSION; DISTAL CLAVICLE RESECTION AND LABRUM DEBRIDEMENT (Left)  Patient Location: PACU  Anesthesia Type:GA combined with regional for post-op pain  Level of Consciousness: awake, alert  and oriented  Airway and Oxygen Therapy: Patient Spontanous Breathing  Post-op Pain: none  Post-op Assessment: Post-op Vital signs reviewed  Post-op Vital Signs: Reviewed  Last Vitals:  Filed Vitals:   06/17/13 1500  BP: 115/77  Pulse: 79  Temp:   Resp: 19    Complications: No apparent anesthesia complications

## 2013-06-17 NOTE — Brief Op Note (Signed)
06/17/2013  2:31 PM  PATIENT:  Greg Alvarez  53 y.o. male  PRE-OPERATIVE DIAGNOSIS:  LEFT SHOULDER IMPINGEMENT WITH ROTATOR CUFF TEAR   POST-OPERATIVE DIAGNOSIS:  LEFT SHOULDER IMPINGEMENT, AC Arthritis, Rotator cuff tendinitis   PROCEDURE:  Procedure(s): LEFT SHOULDER ARTHROSCOPY WITH SUBACROMIAL DECOMPRESSION; DISTAL CLAVICLE RESECTION AND LABRUM DEBRIDEMENT (Left)  SURGEON:  Surgeon(s) and Role:    * Wyn Forster., MD - Primary  PHYSICIAN ASSISTANT:   ASSISTANTS: Mallory Shirk.A-C    ANESTHESIA:   general  EBL:  Total I/O In: 1360 [I.V.:1360] Out: -   BLOOD ADMINISTERED:none  DRAINS: none   LOCAL MEDICATIONS USED:  Ropivacaine plexus block  SPECIMEN:  No Specimen  DISPOSITION OF SPECIMEN:  N/A  COUNTS:  YES  TOURNIQUET:  * No tourniquets in log *  DICTATION: .Other Dictation: Dictation Number [id number]  PLAN OF CARE: Discharge to home after PACU  PATIENT DISPOSITION:  PACU - hemodynamically stable.   Delay start of Pharmacological VTE agent (>24hrs) due to surgical blood loss or risk of bleeding: not applicable

## 2013-06-17 NOTE — Op Note (Signed)
[id number] 

## 2013-06-17 NOTE — Anesthesia Procedure Notes (Addendum)
Anesthesia Regional Block:  Interscalene brachial plexus block  Pre-Anesthetic Checklist: ,, timeout performed, Correct Patient, Correct Site, Correct Laterality, Correct Procedure, Correct Position, site marked, Risks and benefits discussed,  Surgical consent,  Pre-op evaluation,  At surgeon's request and post-op pain management  Laterality: Left and Upper  Prep: chloraprep       Needles:  Injection technique: Single-shot  Needle Type: Echogenic Needle     Needle Length: 5cm 5 cm Needle Gauge: 21 and 21 G    Additional Needles:  Procedures: ultrasound guided (picture in chart) Interscalene brachial plexus block Narrative:  Start time: 06/17/2013 11:00 AM End time: 06/17/2013 11:12 AM Injection made incrementally with aspirations every 5 mL.  Performed by: Personally  Anesthesiologist: Sheldon Silvan, MD   Performed by: Gar Gibbon    Procedure Name: Intubation Date/Time: 06/17/2013 1:00 PM Performed by: Gar Gibbon Pre-anesthesia Checklist: Patient identified, Emergency Drugs available, Suction available and Patient being monitored Patient Re-evaluated:Patient Re-evaluated prior to inductionOxygen Delivery Method: Circle System Utilized Preoxygenation: Pre-oxygenation with 100% oxygen Intubation Type: IV induction Ventilation: Mask ventilation without difficulty Tube type: Oral Tube size: 8.0 mm Number of attempts: 1 Airway Equipment and Method: stylet,  oral airway and Video-laryngoscopy Placement Confirmation: ETT inserted through vocal cords under direct vision,  positive ETCO2 and breath sounds checked- equal and bilateral Secured at: 21 cm Tube secured with: Tape Dental Injury: Teeth and Oropharynx as per pre-operative assessment  Difficulty Due To: Difficulty was anticipated

## 2013-06-17 NOTE — Transfer of Care (Signed)
Immediate Anesthesia Transfer of Care Note  Patient: Greg Alvarez  Procedure(s) Performed: Procedure(s): LEFT SHOULDER ARTHROSCOPY WITH SUBACROMIAL DECOMPRESSION; DISTAL CLAVICLE RESECTION AND LABRUM DEBRIDEMENT (Left)  Patient Location: PACU  Anesthesia Type:GA combined with regional for post-op pain  Level of Consciousness: sedated  Airway & Oxygen Therapy: Patient Spontanous Breathing and Patient connected to face mask oxygen  Post-op Assessment: Report given to PACU RN and Post -op Vital signs reviewed and stable  Post vital signs: Reviewed and stable  Complications: No apparent anesthesia complications

## 2013-07-28 ENCOUNTER — Encounter (HOSPITAL_BASED_OUTPATIENT_CLINIC_OR_DEPARTMENT_OTHER): Payer: Self-pay | Admitting: *Deleted

## 2013-07-28 ENCOUNTER — Other Ambulatory Visit: Payer: Self-pay | Admitting: Orthopedic Surgery

## 2013-07-28 NOTE — Progress Notes (Signed)
Here 2/15,4/15-needs [employer]

## 2013-07-29 ENCOUNTER — Encounter (HOSPITAL_BASED_OUTPATIENT_CLINIC_OR_DEPARTMENT_OTHER): Payer: BC Managed Care – HMO | Admitting: Certified Registered"

## 2013-07-29 ENCOUNTER — Encounter (HOSPITAL_BASED_OUTPATIENT_CLINIC_OR_DEPARTMENT_OTHER): Payer: Self-pay | Admitting: Orthopedic Surgery

## 2013-07-29 ENCOUNTER — Ambulatory Visit (HOSPITAL_BASED_OUTPATIENT_CLINIC_OR_DEPARTMENT_OTHER)
Admission: RE | Admit: 2013-07-29 | Discharge: 2013-07-29 | Disposition: A | Discharge status: Home / Self Care | Payer: BC Managed Care – HMO | Source: Ambulatory Visit | Attending: Orthopedic Surgery | Admitting: Orthopedic Surgery

## 2013-07-29 ENCOUNTER — Ambulatory Visit (HOSPITAL_BASED_OUTPATIENT_CLINIC_OR_DEPARTMENT_OTHER): Payer: BC Managed Care – HMO | Admitting: Certified Registered"

## 2013-07-29 ENCOUNTER — Encounter (HOSPITAL_BASED_OUTPATIENT_CLINIC_OR_DEPARTMENT_OTHER)
Admission: RE | Disposition: A | Discharge status: Home / Self Care | Payer: Self-pay | Source: Ambulatory Visit | Attending: Orthopedic Surgery

## 2013-07-29 DIAGNOSIS — E669 Obesity, unspecified: Secondary | ICD-10-CM | POA: Insufficient documentation

## 2013-07-29 DIAGNOSIS — E119 Type 2 diabetes mellitus without complications: Secondary | ICD-10-CM | POA: Insufficient documentation

## 2013-07-29 DIAGNOSIS — Z7982 Long term (current) use of aspirin: Secondary | ICD-10-CM | POA: Insufficient documentation

## 2013-07-29 DIAGNOSIS — G5600 Carpal tunnel syndrome, unspecified upper limb: Secondary | ICD-10-CM | POA: Insufficient documentation

## 2013-07-29 DIAGNOSIS — Z79899 Other long term (current) drug therapy: Secondary | ICD-10-CM | POA: Insufficient documentation

## 2013-07-29 DIAGNOSIS — Z6839 Body mass index (BMI) 39.0-39.9, adult: Secondary | ICD-10-CM | POA: Insufficient documentation

## 2013-07-29 DIAGNOSIS — Z888 Allergy status to other drugs, medicaments and biological substances status: Secondary | ICD-10-CM | POA: Insufficient documentation

## 2013-07-29 DIAGNOSIS — I10 Essential (primary) hypertension: Secondary | ICD-10-CM | POA: Insufficient documentation

## 2013-07-29 HISTORY — PX: CARPAL TUNNEL RELEASE: SHX101

## 2013-07-29 LAB — POCT I-STAT, CHEM 8
BUN: 8 mg/dL (ref 6–23)
Calcium, Ion: 1.21 mmol/L (ref 1.12–1.23)
Chloride: 100 mEq/L (ref 96–112)
Creatinine, Ser: 0.6 mg/dL (ref 0.50–1.35)
GLUCOSE: 149 mg/dL — AB (ref 70–99)
HCT: 44 % (ref 39.0–52.0)
HEMOGLOBIN: 15 g/dL (ref 13.0–17.0)
Potassium: 4.3 mEq/L (ref 3.7–5.3)
Sodium: 138 mEq/L (ref 137–147)
TCO2: 27 mmol/L (ref 0–100)

## 2013-07-29 LAB — GLUCOSE, CAPILLARY: Glucose-Capillary: 114 mg/dL — ABNORMAL HIGH (ref 70–99)

## 2013-07-29 SURGERY — CARPAL TUNNEL RELEASE
Anesthesia: Monitor Anesthesia Care | Site: Wrist | Laterality: Left

## 2013-07-29 MED ORDER — FENTANYL CITRATE 0.05 MG/ML IJ SOLN
INTRAMUSCULAR | Status: DC | PRN
  Administered 2013-07-29 (×2): 50 ug via INTRAVENOUS

## 2013-07-29 MED ORDER — PROPOFOL 10 MG/ML IV BOLUS
INTRAVENOUS | Status: AC
  Filled 2013-07-29: qty 20

## 2013-07-29 MED ORDER — ONDANSETRON HCL 4 MG/2ML IJ SOLN
INTRAMUSCULAR | Status: DC | PRN
  Administered 2013-07-29: 4 mg via INTRAVENOUS

## 2013-07-29 MED ORDER — FENTANYL CITRATE 0.05 MG/ML IJ SOLN
INTRAMUSCULAR | Status: AC
  Filled 2013-07-29: qty 2

## 2013-07-29 MED ORDER — HYDROMORPHONE HCL PF 1 MG/ML IJ SOLN
0.2500 mg | INTRAMUSCULAR | Status: DC | PRN
Start: 2013-07-29 — End: 2013-07-29

## 2013-07-29 MED ORDER — BUPIVACAINE HCL (PF) 0.25 % IJ SOLN
INTRAMUSCULAR | Status: DC | PRN
  Administered 2013-07-29: 8 mL

## 2013-07-29 MED ORDER — MIDAZOLAM HCL 5 MG/5ML IJ SOLN
INTRAMUSCULAR | Status: DC | PRN
  Administered 2013-07-29: 2 mg via INTRAVENOUS

## 2013-07-29 MED ORDER — OXYCODONE HCL 5 MG/5ML PO SOLN: 5.0000 mg | Freq: Once | ORAL | Status: DC | PRN

## 2013-07-29 MED ORDER — CEFAZOLIN SODIUM-DEXTROSE 2-3 GM-% IV SOLR
2.0000 g | INTRAVENOUS | Status: DC
Start: 2013-07-29 — End: 2013-07-29

## 2013-07-29 MED ORDER — PROPOFOL INFUSION 10 MG/ML OPTIME
INTRAVENOUS | Status: DC | PRN
  Administered 2013-07-29: 120 ug/kg/min via INTRAVENOUS

## 2013-07-29 MED ORDER — CHLORHEXIDINE GLUCONATE 4 % EX LIQD: 60.0000 mL | Freq: Once | CUTANEOUS | Status: DC

## 2013-07-29 MED ORDER — HYDROCODONE-ACETAMINOPHEN 10-325 MG PO TABS: 1.0000 | ORAL_TABLET | Freq: Four times a day (QID) | ORAL | Status: DC | PRN

## 2013-07-29 MED ORDER — ONDANSETRON HCL 4 MG/2ML IJ SOLN: 4.0000 mg | Freq: Once | INTRAMUSCULAR | Status: DC | PRN

## 2013-07-29 MED ORDER — LIDOCAINE HCL (CARDIAC) 20 MG/ML IV SOLN
INTRAVENOUS | Status: DC | PRN
  Administered 2013-07-29: 50 mg via INTRAVENOUS

## 2013-07-29 MED ORDER — CEFAZOLIN SODIUM-DEXTROSE 2-3 GM-% IV SOLR
2.0000 g | INTRAVENOUS | Status: AC
  Administered 2013-07-29: 2 g via INTRAVENOUS

## 2013-07-29 MED ORDER — MELOXICAM 15 MG PO TABS: 15.0000 mg | ORAL_TABLET | Freq: Every day | ORAL | Status: DC

## 2013-07-29 MED ORDER — MIDAZOLAM HCL 2 MG/2ML IJ SOLN
INTRAMUSCULAR | Status: AC
  Filled 2013-07-29: qty 2

## 2013-07-29 MED ORDER — LACTATED RINGERS IV SOLN
INTRAVENOUS | Status: DC
  Administered 2013-07-29 (×2): via INTRAVENOUS

## 2013-07-29 MED ORDER — LIDOCAINE HCL (PF) 0.5 % IJ SOLN
INTRAMUSCULAR | Status: DC | PRN
  Administered 2013-07-29: 50 mL via INTRAVENOUS

## 2013-07-29 MED ORDER — CEFAZOLIN SODIUM-DEXTROSE 2-3 GM-% IV SOLR
INTRAVENOUS | Status: AC
  Filled 2013-07-29: qty 50

## 2013-07-29 MED ORDER — OXYCODONE HCL 5 MG PO TABS: 5.0000 mg | ORAL_TABLET | Freq: Once | ORAL | Status: DC | PRN

## 2013-07-29 SURGICAL SUPPLY — 37 items
BLADE SURG 15 STRL LF DISP TIS (BLADE) ×1 IMPLANT
BLADE SURG 15 STRL SS (BLADE) ×2
BNDG COHESIVE 3X5 TAN STRL LF (GAUZE/BANDAGES/DRESSINGS) ×3 IMPLANT
BNDG ESMARK 4X9 LF (GAUZE/BANDAGES/DRESSINGS) IMPLANT
BNDG GAUZE ELAST 4 BULKY (GAUZE/BANDAGES/DRESSINGS) ×3 IMPLANT
CHLORAPREP W/TINT 26ML (MISCELLANEOUS) ×3 IMPLANT
CORDS BIPOLAR (ELECTRODE) ×3 IMPLANT
COVER MAYO STAND STRL (DRAPES) ×3 IMPLANT
COVER TABLE BACK 60X90 (DRAPES) ×3 IMPLANT
CUFF TOURNIQUET SINGLE 18IN (TOURNIQUET CUFF) ×3 IMPLANT
DRAPE EXTREMITY T 121X128X90 (DRAPE) ×3 IMPLANT
DRAPE SURG 17X23 STRL (DRAPES) ×3 IMPLANT
DRSG PAD ABDOMINAL 8X10 ST (GAUZE/BANDAGES/DRESSINGS) ×3 IMPLANT
GAUZE SPONGE 4X4 12PLY STRL (GAUZE/BANDAGES/DRESSINGS) ×3 IMPLANT
GAUZE XEROFORM 1X8 LF (GAUZE/BANDAGES/DRESSINGS) ×3 IMPLANT
GLOVE BIO SURGEON STRL SZ7.5 (GLOVE) ×3 IMPLANT
GLOVE BIOGEL PI IND STRL 7.0 (GLOVE) ×1 IMPLANT
GLOVE BIOGEL PI IND STRL 8 (GLOVE) ×1 IMPLANT
GLOVE BIOGEL PI IND STRL 8.5 (GLOVE) ×1 IMPLANT
GLOVE BIOGEL PI INDICATOR 7.0 (GLOVE) ×2
GLOVE BIOGEL PI INDICATOR 8 (GLOVE) ×2
GLOVE BIOGEL PI INDICATOR 8.5 (GLOVE) ×2
GLOVE ECLIPSE 7.0 STRL STRAW (GLOVE) ×3 IMPLANT
GLOVE SURG ORTHO 8.0 STRL STRW (GLOVE) ×3 IMPLANT
GOWN STRL REUS W/ TWL LRG LVL3 (GOWN DISPOSABLE) ×1 IMPLANT
GOWN STRL REUS W/TWL LRG LVL3 (GOWN DISPOSABLE) ×2
GOWN STRL REUS W/TWL XL LVL3 (GOWN DISPOSABLE) ×6 IMPLANT
NEEDLE 27GAX1X1/2 (NEEDLE) ×3 IMPLANT
NS IRRIG 1000ML POUR BTL (IV SOLUTION) ×3 IMPLANT
PACK BASIN DAY SURGERY FS (CUSTOM PROCEDURE TRAY) ×3 IMPLANT
STOCKINETTE 4X48 STRL (DRAPES) ×3 IMPLANT
SUT VICRYL 4-0 PS2 18IN ABS (SUTURE) IMPLANT
SUT VICRYL RAPIDE 4/0 PS 2 (SUTURE) ×3 IMPLANT
SYR BULB 3OZ (MISCELLANEOUS) ×3 IMPLANT
SYR CONTROL 10ML LL (SYRINGE) ×3 IMPLANT
TOWEL OR 17X24 6PK STRL BLUE (TOWEL DISPOSABLE) ×3 IMPLANT
UNDERPAD 30X30 INCONTINENT (UNDERPADS AND DIAPERS) ×3 IMPLANT

## 2013-07-29 NOTE — Transfer of Care (Signed)
Immediate Anesthesia Transfer of Care Note  Patient: Greg Alvarez  Procedure(s) Performed: Procedure(s): LEFT CARPAL TUNNEL RELEASE (Left)  Patient Location: PACU  Anesthesia Type:MAC and Bier block  Level of Consciousness: awake and alert   Airway & Oxygen Therapy: Patient Spontanous Breathing and Patient connected to face mask oxygen  Post-op Assessment: Report given to PACU RN and Post -op Vital signs reviewed and stable  Post vital signs: Reviewed and stable  Complications: No apparent anesthesia complications

## 2013-07-29 NOTE — Discharge Instructions (Addendum)

## 2013-07-29 NOTE — Brief Op Note (Signed)
07/29/2013  3:04 PM  PATIENT:  Sylvester Harder  53 y.o. male  PRE-OPERATIVE DIAGNOSIS:  LEFT CARPAL TUNNELL SYNDROME  POST-OPERATIVE DIAGNOSIS:  LEFT CARPAL TUNNELL SYNDROME  PROCEDURE:  Procedure(s): LEFT CARPAL TUNNEL RELEASE (Left)  SURGEON:  Surgeon(s) and Role:    * Nicki Reaper, MD - Primary    * Tami Ribas, MD - Assisting  PHYSICIAN ASSISTANT:   ASSISTANTS: K Laya Letendre,MD   ANESTHESIA:   local and regional  EBL:  Total I/O In: 600 [I.V.:600] Out: -   BLOOD ADMINISTERED:none  DRAINS: none   LOCAL MEDICATIONS USED:  BUPIVICAINE   SPECIMEN:  No Specimen  DISPOSITION OF SPECIMEN:  N/A  COUNTS:  YES  TOURNIQUET:   Total Tourniquet Time Documented: Forearm (Left) - 24 minutes Total: Forearm (Left) - 24 minutes   DICTATION: .Other Dictation: Dictation Number [id number]  PLAN OF CARE: Discharge to home after PACU  PATIENT DISPOSITION:  PACU - hemodynamically stable.

## 2013-07-29 NOTE — H&P (Signed)
Greg Alvarez is a 53 year-old left-hand dominant male with carpal tunnel syndrome bilaterally.  Nerve conductions are positive.  He has undergone release on his right side with a tenosynovectomy secondary to the amount of swelling.  He has had his left shoulder done by Dr. Teressa Senter.   He is desirous to proceed to have his left carpal tunnel released.  He has recently been on a Medrol Dosepak which has given him some relief of symptoms.  He complains of a significant severe numbness and tingling to his left side and is desirous to proceed.    ALLERGIES:   Ativan.  MEDICATIONS:    Lotrel, metformin, Celexa, aspirin, Lipitor.    SURGICAL HISTORY:     Tendon repair, right hand in 1980.  FAMILY MEDICAL HISTORY:    Positive for diabetes, heart disease, high blood pressure and arthritis.  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    REVIEW OF SYSTEMS:   Positive for glasses, high blood pressure, otherwise negative 14 points.  Greg Alvarez is an 53 y.o. male.   Chief Complaint: CTS left HPI: see above  Past Medical History  Diagnosis Date  . Panic attacks   . Tenosynovitis of wrist 03/2013    right  . Diabetes mellitus type 2, noninsulin dependent   . Hypertension     under control with med., has been on med. x 10 yr.  . High cholesterol   . 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 Wears glasses     Past Surgical History  Procedure Laterality Date  . Appendectomy  1980  . Cardiac catheterization  2002     states no problem - was done in [place]  . Tendon repair Right     hand  . Tenosynovectomy Right 04/08/2013    Procedure: TENOSYNOVECTOMY/FLEXOR TENDONS RIGHT WRIST;  Surgeon: Nicki Reaper, MD;  Location: [facility];  Service: Orthopedics;  Laterality: Right;  . Shoulder arthroscopy w/ rotator cuff repair  4/15    left-DSC    Family History  Problem Relation Age  of Onset  . Anesthesia problems Sister     hard to wake up post-op   Social History:  reports that he has never smoked. He has never used smokeless tobacco. He reports that he drinks alcohol. He reports that he does not use illicit drugs.  Allergies:  Allergies  Allergen Reactions  . Ativan [Lorazepam] Other (See Comments)    HYPERACTIVITY    No prescriptions prior to admission    No results found for this or any previous visit (from the past 48 hour(s)).  No results found.   Pertinent items are noted in HPI.  Height 5\' 6"  (1.676 m), weight 111.131 kg (245 lb).  General appearance: alert, cooperative and appears stated age Head: Normocephalic, without obvious abnormality Neck: no JVD Resp: clear to auscultation bilaterally Cardio: regular rate and rhythm, S1, S2 normal, no murmur, click, rub or gallop GI: soft, non-tender; bowel sounds normal; no masses,  no organomegaly Extremities: extremities normal, atraumatic, no cyanosis or edema Pulses: 2+ and symmetric Skin: Skin color, texture, turgor normal. No rashes or lesions Neurologic: Grossly normal Incision/Wound: na  Assessment/Plan RECOMMENDATIONS/PLAN:     He is scheduled for left carpal tunnel release as an outpatient under regional anesthesia.  He is aware of risks and complications, possibility of infection, recurrence,  injury to arteries, nerves, tendons, incomplete relief of symptoms and dystrophy.    Nicki Reaper 07/29/2013, 10:19 AM

## 2013-07-29 NOTE — Anesthesia Procedure Notes (Signed)
Anesthesia Regional Block:  Bier block (IV Regional)  Pre-Anesthetic Checklist: ,, timeout performed, Correct Patient, Correct Site, Correct Laterality, Correct Procedure,, site marked, surgical consent,, at surgeon's request Needles:  Injection technique: Single-shot  Needle Type: Other      Needle Gauge: 20 and 20 G    Additional Needles: Bier block (IV Regional) Narrative:  Start time: 07/29/2013 2:34 PM End time: 07/29/2013 2:35 PM Injection made incrementally with aspirations every 35 mL.  Performed by: Personally

## 2013-07-29 NOTE — Op Note (Signed)
Dictation Number [id number]

## 2013-07-29 NOTE — Anesthesia Postprocedure Evaluation (Signed)
  Anesthesia Post-op Note  Patient: Greg Alvarez  Procedure(s) Performed: Procedure(s): LEFT CARPAL TUNNEL RELEASE (Left)  Patient Location: PACU  Anesthesia Type:MAC  Level of Consciousness: awake  Airway and Oxygen Therapy: Patient Spontanous Breathing  Post-op Pain: mild  Post-op Assessment: Post-op Vital signs reviewed  Post-op Vital Signs: Reviewed  Last Vitals:  Filed Vitals:   07/29/13 1530  BP: 127/88  Pulse: 67  Temp:   Resp: 14    Complications: No apparent anesthesia complications

## 2013-07-29 NOTE — Anesthesia Preprocedure Evaluation (Addendum)
Anesthesia Evaluation  Patient identified by MRN, date of birth, ID band Patient awake    Reviewed: Allergy & Precautions, H&P , NPO status , Patient's Chart, lab work & pertinent test results  Airway Mallampati: I TM Distance: >3 FB Neck ROM: Full    Dental  (+) Teeth Intact, Dental Advisory Given   Pulmonary neg pulmonary ROS,  breath sounds clear to auscultation        Cardiovascular hypertension, Pt. on medications Rhythm:Regular Rate:Normal     Neuro/Psych    GI/Hepatic negative GI ROS, Neg liver ROS,   Endo/Other  diabetes, Well Controlled, Type 2, Oral Hypoglycemic AgentsMorbid obesity  Renal/GU negative Renal ROS     Musculoskeletal   Abdominal   Peds  Hematology   Anesthesia Other Findings   Reproductive/Obstetrics                         Anesthesia Physical Anesthesia Plan  ASA: III  Anesthesia Plan: MAC and Bier Block   Post-op Pain Management:    Induction: Intravenous  Airway Management Planned: Simple Face Mask  Additional Equipment:   Intra-op Plan:   Post-operative Plan:   Informed Consent: I have reviewed the patients History and Physical, chart, labs and discussed the procedure including the risks, benefits and alternatives for the proposed anesthesia with the patient or authorized representative who has indicated his/her understanding and acceptance.   Dental advisory given  Plan Discussed with: CRNA, Anesthesiologist and Surgeon  Anesthesia Plan Comments:         Anesthesia Quick Evaluation

## 2013-07-30 ENCOUNTER — Encounter (HOSPITAL_BASED_OUTPATIENT_CLINIC_OR_DEPARTMENT_OTHER): Payer: Self-pay | Admitting: Orthopedic Surgery

## 2013-07-30 NOTE — Op Note (Signed)
Greg Alvarez, Greg Alvarez                ACCOUNT NO.:  [id number]  MEDICAL RECORD NO.:  [id number]  LOCATION:                                 FACILITY:  PHYSICIAN:  Cindee Salt, M.D.       DATE OF BIRTH:  1960/08/14  DATE OF PROCEDURE:  07/29/2013 DATE OF DISCHARGE:  07/29/2013                              OPERATIVE REPORT   PREOPERATIVE DIAGNOSIS:  Carpal tunnel syndrome, left hand.  POSTOPERATIVE DIAGNOSIS:  Carpal tunnel syndrome, left hand.  OPERATION:  Decompression of left median nerve.  SURGEON:  Cindee Salt, M.D.  ASSISTANT:  Betha Loa, MD  ANESTHESIA:  Forearm-based IV regional with local infiltration.  ANESTHESIOLOGIST:  Sheldon Silvan, M.D.  HISTORY:  The patient is a 53 year old male with history of bilateral carpal tunnel syndrome.  He has undergone release on his right side, is admitted now for release on his left, nerve conductions are positive. He has had a good result on his right side.  Pre, peri, and postoperative course have been discussed along with risks and complications.  He is aware that there is no guarantee with the surgery; possibility of infection; recurrence of injury to arteries, nerves, tendons; incomplete relief of symptoms and dystrophy.  In the preoperative area, the patient was seen, the extremity marked by both patient and surgeon, and antibiotic given.  PROCEDURE IN DETAIL:  The patient was brought to the operating room, where forearm-based IV regional anesthetic was carried out without difficulty.  He was prepped using ChloraPrep, supine position with the left arm free.  A 3-minute dry-time was allowed.  Time-out taken, confirming the patient and procedure.  A longitudinal incision was made in the left palm, carried down through the subcutaneous tissue. Bleeders were electrocauterized with bipolar.  Palmar fascia was split. Superficial palmar arch identified.  The flexor tendon to the ring and little finger identified to the ulnar side of the  median nerve.  The carpal retinaculum was incised with sharp dissection.  A right angle and Sewall retractor were placed between the skin and forearm fascia.  The fascia was released for approximately 1.5 cm proximal to the wrist crease under direct vision.  Canal was explored.  Air compression to the nerve was apparent.  Motor branch entered into the muscle.  No further lesions were identified.  The wound was irrigated.  The skin was closed with interrupted 4-0 Vicryl Rapide sutures.  Sterile compressive dressing was applied after an injection of the area with local anesthetic, 0.25% bupivacaine without epinephrine, approximately 9 mL was used.  On deflation of the tourniquet, all fingers were immediately pinked.  He was taken to the recovery room for observation in satisfactory condition.  He will be discharged to home to return to the [facility] in 1 week, on Vicodin and meloxicam.          ______________________________ Cindee Salt, M.D.     GK/MEDQ  D:  07/29/2013  T:  07/30/2013  Job:  [id number]

## 2013-11-06 ENCOUNTER — Telehealth: Payer: Self-pay

## 2013-11-06 NOTE — Telephone Encounter (Signed)
PATIENT CALLED WANTING TO SCHEDULE A COLONOSCOPY PLEASE CALL BACK [phone]

## 2013-11-11 NOTE — Telephone Encounter (Signed)
Gastroenterology Pre-Procedure Review  Request Date: 11/10/2013 Requesting Physician: Dr. Sherwood Gambler  PATIENT REVIEW QUESTIONS: The patient responded to the following health history questions as indicated:    PT HAD RECENT ROTATOR CUFF REPAIR ON 10/29/2013/ WILL BE DOING THERAPY IS IT OK TO SCHEDULE FOR NOW?  1. Diabetes Melitis: YES 2. Joint replacements in the past 12 months: no 3. Major health problems in the past 3 months: no 4. Has an artificial valve or MVP: no 5. Has a defibrillator: no 6. Has been advised in past to take antibiotics in advance of a procedure like teeth cleaning: no    MEDICATIONS & ALLERGIES:    Patient reports the following regarding taking any blood thinners:   Plavix? no Aspirin? YES Coumadin? no  Patient confirms/reports the following medications:  Current Outpatient Prescriptions  Medication Sig Dispense Refill  . amLODipine-benazepril (LOTREL) 10-20 MG per capsule Take 1 capsule by mouth daily.      Marland Kitchen aspirin 81 MG tablet Take 81 mg by mouth daily.      . citalopram (CELEXA) 20 MG tablet Take 20 mg by mouth daily.      Marland Kitchen co-enzyme Q-10 30 MG capsule Take 30 mg by mouth daily.       . metformin (FORTAMET) 500 MG (OSM) 24 hr tablet Take 500 mg by mouth daily with breakfast.      . naproxen sodium (ANAPROX) 220 MG tablet Take 220 mg by mouth 2 (two) times daily with a meal.      . atorvastatin (LIPITOR) 10 MG tablet Take 10 mg by mouth daily.      Marland Kitchen HYDROcodone-acetaminophen (NORCO) 10-325 MG per tablet Take 1 tablet by mouth every 6 (six) hours as needed.  30 tablet  0  . meloxicam (MOBIC) 15 MG tablet Take 1 tablet (15 mg total) by mouth daily.  30 tablet  0   No current facility-administered medications for this visit.    Patient confirms/reports the following allergies:  Allergies  Allergen Reactions  . Ativan [Lorazepam] Other (See Comments)    HYPERACTIVITY    No orders of the defined types were placed in this encounter.    AUTHORIZATION  INFORMATION Primary Insurance:   ID #:   Group #:  Pre-Cert / Auth required:  Pre-Cert / Auth #:   Secondary Insurance:  ID #:   Group #:  Pre-Cert / Auth required:  Pre-Cert / Auth #:   SCHEDULE INFORMATION: Procedure has been scheduled as follows:  Date:            Time:   Location:   This Gastroenterology Pre-Precedure Review Form is being routed to the following provider(s): R. Roetta Sessions, MD

## 2013-11-12 NOTE — Addendum Note (Signed)
Addended by: Tiffany Kocher on: 11/12/2013 08:02 AM   Modules accepted: Orders

## 2013-11-12 NOTE — Telephone Encounter (Signed)
Since this is screening TCS. I would suggest we wait until patient is out of his sling prior to pursing TCS.

## 2013-11-13 NOTE — Telephone Encounter (Signed)
Left the message on the VM and asked for a return call.

## 2013-11-13 NOTE — Telephone Encounter (Signed)
Pt returned call and said he has been out of the sling for about 3 weeks. His surgery was in August. He still wishes to pursue colonoscopy now.

## 2013-11-17 NOTE — Telephone Encounter (Signed)
As long as he can lay on his left side and is out of the sling, okay to schedule.  Day of prep: hold metformin.

## 2013-12-15 ENCOUNTER — Other Ambulatory Visit: Payer: Self-pay

## 2013-12-15 DIAGNOSIS — Z1211 Encounter for screening for malignant neoplasm of colon: Secondary | ICD-10-CM

## 2013-12-15 NOTE — Telephone Encounter (Signed)
LMOM to call to schedule and review meds.

## 2013-12-22 MED ORDER — PEG-KCL-NACL-NASULF-NA ASC-C 100 G PO SOLR: 1.0000 | ORAL | Status: DC

## 2013-12-22 NOTE — Addendum Note (Signed)
Addended by: Lavena Bullion on: 12/22/2013 02:05 PM   Modules accepted: Orders

## 2013-12-22 NOTE — Telephone Encounter (Signed)
Rx sent to the pharmacy and instructions mailed to pt.  

## 2013-12-22 NOTE — Telephone Encounter (Signed)
Rx printed and was faxed to the pharmacy.

## 2013-12-23 NOTE — Telephone Encounter (Signed)
Pt returned call. There has been no change in his meds. He is scheduled for colonoscopy with Dr. Jena Gauss on 01/12/2014 at 9:30 AM.

## 2013-12-25 NOTE — Telephone Encounter (Signed)
Noted  

## 2014-01-01 ENCOUNTER — Telehealth: Payer: Self-pay

## 2014-01-01 NOTE — Telephone Encounter (Addendum)
I called [employer] at [phone] and spoke to [place] to see if a PA is required for a screening colonoscopy. She verified that the pt had a policy from 02-28-2011 and it terminated on 12/27/2013. I have called and LMOM for pt to call back in reference to his insurance. I need to find out if he has a recent policy. Reference # is [id number].

## 2014-01-01 NOTE — Telephone Encounter (Signed)
Pt returned call and said he has to update the info each month. He has sent his check in [facility]) Said I should call back next week around 01/08/2014. Can call the same phone number.

## 2014-01-06 ENCOUNTER — Other Ambulatory Visit: Payer: Self-pay | Admitting: Orthopedic Surgery

## 2014-01-06 DIAGNOSIS — M25511 Pain in right shoulder: Secondary | ICD-10-CM

## 2014-01-08 ENCOUNTER — Telehealth: Payer: Self-pay

## 2014-01-08 NOTE — Telephone Encounter (Signed)
I called and spoke to [place] C at [facility] [phone]). She said a PA is not required for a screening colonoscopy. Reference # [id number].

## 2014-01-08 NOTE — Telephone Encounter (Signed)
Noted. Same instructions, hold metformin day of prep.

## 2014-01-08 NOTE — Telephone Encounter (Signed)
I called pt to update triage. He has not had any change in his medications and no new medical problems.

## 2014-01-12 ENCOUNTER — Ambulatory Visit (HOSPITAL_COMMUNITY)
Admission: RE | Admit: 2014-01-12 | Discharge: 2014-01-12 | Disposition: A | Discharge status: Home / Self Care | Payer: BC Managed Care – PPO | Source: Ambulatory Visit | Attending: Internal Medicine | Admitting: Internal Medicine

## 2014-01-12 ENCOUNTER — Encounter (HOSPITAL_COMMUNITY): Payer: Self-pay | Admitting: *Deleted

## 2014-01-12 ENCOUNTER — Encounter (HOSPITAL_COMMUNITY)
Admission: RE | Disposition: A | Discharge status: Home / Self Care | Payer: Self-pay | Source: Ambulatory Visit | Attending: Internal Medicine

## 2014-01-12 DIAGNOSIS — E780 Pure hypercholesterolemia: Secondary | ICD-10-CM | POA: Diagnosis not present

## 2014-01-12 DIAGNOSIS — Z7982 Long term (current) use of aspirin: Secondary | ICD-10-CM | POA: Insufficient documentation

## 2014-01-12 DIAGNOSIS — Z1211 Encounter for screening for malignant neoplasm of colon: Secondary | ICD-10-CM | POA: Diagnosis not present

## 2014-01-12 DIAGNOSIS — E119 Type 2 diabetes mellitus without complications: Secondary | ICD-10-CM | POA: Diagnosis not present

## 2014-01-12 DIAGNOSIS — I10 Essential (primary) hypertension: Secondary | ICD-10-CM | POA: Insufficient documentation

## 2014-01-12 DIAGNOSIS — Z1212 Encounter for screening for malignant neoplasm of rectum: Secondary | ICD-10-CM

## 2014-01-12 HISTORY — PX: COLONOSCOPY: SHX5424

## 2014-01-12 LAB — GLUCOSE, CAPILLARY: Glucose-Capillary: 116 mg/dL — ABNORMAL HIGH (ref 70–99)

## 2014-01-12 SURGERY — COLONOSCOPY
Anesthesia: Moderate Sedation

## 2014-01-12 MED ORDER — MIDAZOLAM HCL 5 MG/5ML IJ SOLN
INTRAMUSCULAR | Status: AC
  Filled 2014-01-12: qty 10

## 2014-01-12 MED ORDER — STERILE WATER FOR IRRIGATION IR SOLN
Status: DC | PRN
  Administered 2014-01-12: 11:00:00

## 2014-01-12 MED ORDER — ONDANSETRON HCL 4 MG/2ML IJ SOLN
INTRAMUSCULAR | Status: DC | PRN
  Administered 2014-01-12: 4 mg via INTRAVENOUS

## 2014-01-12 MED ORDER — MEPERIDINE HCL 100 MG/ML IJ SOLN
INTRAMUSCULAR | Status: AC
  Filled 2014-01-12: qty 2

## 2014-01-12 MED ORDER — ONDANSETRON HCL 4 MG/2ML IJ SOLN
INTRAMUSCULAR | Status: AC
  Filled 2014-01-12: qty 2

## 2014-01-12 MED ORDER — SODIUM CHLORIDE 0.9 % IV SOLN
INTRAVENOUS | Status: DC
  Administered 2014-01-12: 09:00:00 via INTRAVENOUS

## 2014-01-12 MED ORDER — MIDAZOLAM HCL 5 MG/5ML IJ SOLN
INTRAMUSCULAR | Status: DC | PRN
  Administered 2014-01-12: 2 mg via INTRAVENOUS
  Administered 2014-01-12: 1 mg via INTRAVENOUS
  Administered 2014-01-12: 2 mg via INTRAVENOUS

## 2014-01-12 MED ORDER — MEPERIDINE HCL 100 MG/ML IJ SOLN
INTRAMUSCULAR | Status: DC | PRN
  Administered 2014-01-12 (×2): 50 mg via INTRAVENOUS

## 2014-01-12 NOTE — Discharge Instructions (Addendum)
°  Colonoscopy Discharge Instructions  Read the instructions outlined below and refer to this sheet in the next few weeks. These discharge instructions provide you with general information on caring for yourself after you leave the hospital. Your doctor may also give you specific instructions. While your treatment has been planned according to the most current medical practices available, unavoidable complications occasionally occur. If you have any problems or questions after discharge, call Dr. Jena Gauss at [phone]. ACTIVITY  You may resume your regular activity, but move at a slower pace for the next 24 hours.   Take frequent rest periods for the next 24 hours.   Walking will help get rid of the air and reduce the bloated feeling in your belly (abdomen).   No driving for 24 hours (because of the medicine (anesthesia) used during the test).    Do not sign any important legal documents or operate any machinery for 24 hours (because of the anesthesia used during the test).  NUTRITION  Drink plenty of fluids.   You may resume your normal diet as instructed by your doctor.   Begin with a light meal and progress to your normal diet. Heavy or fried foods are harder to digest and may make you feel sick to your stomach (nauseated).   Avoid alcoholic beverages for 24 hours or as instructed.  MEDICATIONS  You may resume your normal medications unless your doctor tells you otherwise.  WHAT YOU CAN EXPECT TODAY  Some feelings of bloating in the abdomen.   Passage of more gas than usual.   Spotting of blood in your stool or on the toilet paper.  IF YOU HAD POLYPS REMOVED DURING THE COLONOSCOPY:  No aspirin products for 7 days or as instructed.   No alcohol for 7 days or as instructed.   Eat a soft diet for the next 24 hours.  FINDING OUT THE RESULTS OF YOUR TEST Not all test results are available during your visit. If your test results are not back during the visit, make an appointment  with your caregiver to find out the results. Do not assume everything is normal if you have not heard from your caregiver or the medical facility. It is important for you to follow up on all of your test results.  SEEK IMMEDIATE MEDICAL ATTENTION IF:  You have more than a spotting of blood in your stool.   Your belly is swollen (abdominal distention).   You are nauseated or vomiting.   You have a temperature over 101.   You have abdominal pain or discomfort that is severe or gets worse throughout the day.    Your colonoscopy was normal.  I recommend you have a repeat colonoscopy in 10 years for screening purposes

## 2014-01-12 NOTE — Op Note (Signed)
[facility] [address] [place], [address]   COLONOSCOPY PROCEDURE REPORT  PATIENT: Greg Alvarez, Greg Alvarez  MR#: [id number] BIRTHDATE: 01/17/1961 , 53  yrs. old GENDER: male ENDOSCOPIST: R.  Roetta Sessions, MD FACP [facility] REFERRED DC:VUDTH Sherwood Gambler, M.D. PROCEDURE DATE:  02/07/2014 PROCEDURE:   Colonoscopy, screening INDICATIONS:First-ever average risk screening colonoscopy. MEDICATIONS: Versed 5 mg IV and Demerol 100 mg IV in divided doses. Zofran 4 mg IV. ASA CLASS:       Class II  CONSENT: The risks, benefits, alternatives and imponderables including but not limited to bleeding, perforation as well as the possibility of a missed lesion have been reviewed.  The potential for biopsy, lesion removal, etc. have also been discussed. Questions have been answered.  All parties agreeable.  Please see the history and physical in the medical record for more information.  DESCRIPTION OF PROCEDURE:   After the risks benefits and alternatives of the procedure were thoroughly explained, informed consent was obtained.  The digital rectal exam revealed no rectal mass and revealed no abnormalities of the rectum.   The EC-[id number] ([id number])  endoscope was introduced through the anus and advanced to the terminal ileum which was intubated for a short distance. No adverse events experienced.   The quality of the prep was adequate. The instrument was then slowly withdrawn as the colon was fully examined.      COLON FINDINGS: Normal-appearing rectal mucosa.  Normal-appearing colonic mucosa.  The distal 10 cm of terminal ileal mucosa also appeared normal.  Retroflexion was performed. .  Withdrawal time=12 minutes 0 seconds.  The scope was withdrawn and the procedure completed. COMPLICATIONS: There were no immediate complications.  ENDOSCOPIC IMPRESSION: Normal ileocolonoscopy  RECOMMENDATIONS: Repeat colonoscopy in 10 years  eSigned:  R. Roetta Sessions, MD [facility] Feb 07, 2014  11:06 AM   cc:  CPT CODES: ICD CODES:  The ICD and CPT codes recommended by this software are interpretations from the data that the clinical staff has captured with the software.  The verification of the translation of this report to the ICD and CPT codes and modifiers is the sole responsibility of the health care institution and practicing physician where this report was generated.  [employer] will not be held responsible for the validity of the ICD and CPT codes included on this report.  AMA assumes no liability for data contained or not contained herein. CPT is a Publishing rights manager of the [employer].  PATIENT NAME:  Greg Alvarez, Greg Alvarez MR#: [id number]

## 2014-01-12 NOTE — H&P (Signed)
_0 @   Primary Care Physician:  Glo Herring., MD Primary Gastroenterologist:  Dr. Gala Romney  Pre-Procedure History & Physical: HPI:  Greg Alvarez is a 53 y.o. male is here for a screening colonoscopy. First ever examination. No bowel symptoms. No family history of colon cancer  Past Medical History  Diagnosis Date  . Panic attacks   . Tenosynovitis of wrist 03/2013    right  . Diabetes mellitus type 2, noninsulin dependent   . Hypertension     under control with med., has been on med. x 10 yr.  . High cholesterol   . 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 Wears glasses     Past Surgical History  Procedure Laterality Date  . Appendectomy  1980  . Cardiac catheterization  2002     states no problem - was done in [place]  . Tendon repair Right     hand  . Tenosynovectomy Right 04/08/2013    Procedure: TENOSYNOVECTOMY/FLEXOR TENDONS RIGHT WRIST;  Surgeon: Wynonia Sours, MD;  Location: [place];  Service: Orthopedics;  Laterality: Right;  . Shoulder arthroscopy w/ rotator cuff repair  4/15    left-DSC  . Carpal tunnel release Left 07/29/2013    Procedure: LEFT CARPAL TUNNEL RELEASE;  Surgeon: Wynonia Sours, MD;  Location: [place];  Service: Orthopedics;  Laterality: Left;    Prior to Admission medications   Medication Sig Start Date End Date Taking? Authorizing Provider  amLODipine-benazepril (LOTREL) 10-20 MG per capsule Take 1 capsule by mouth daily.   Yes Historical Provider, MD  aspirin 81 MG tablet Take 81 mg by mouth daily.   Yes Historical Provider, MD  atorvastatin (LIPITOR) 40 MG tablet Take 40 mg by mouth daily.   Yes Historical Provider, MD  citalopram (CELEXA) 20 MG tablet Take 20 mg by mouth daily.   Yes Historical Provider, MD  co-enzyme Q-10 30 MG capsule Take 30 mg by mouth daily.    Yes Historical Provider, MD  folic acid (FOLVITE) 1 MG tablet Take 1 mg by mouth daily.   Yes  Historical Provider, MD  metFORMIN (GLUCOPHAGE) 500 MG tablet Take 500 mg by mouth daily with breakfast.   Yes Historical Provider, MD  Methotrexate Sodium, PF, 50 MG/2ML SOLN 0.6 mLs once a week.  12/10/13  Yes Historical Provider, MD  peg 3350 powder (MOVIPREP) 100 G SOLR Take 1 kit (200 g total) by mouth as directed. 12/22/13  Yes Daneil Dolin, MD    Allergies as of 12/15/2013 - Review Complete 12/15/2013  Allergen Reaction Noted  . Ativan [lorazepam] Other (See Comments) 04/04/2013    Family History  Problem Relation Age of Onset  . Anesthesia problems Sister     hard to wake up post-op    History   Social History  . Marital Status: Divorced    Spouse Name: N/A    Number of Children: N/A  . Years of Education: N/A   Occupational History  . Not on file.   Social History Main Topics  . Smoking status: Never Smoker   . Smokeless tobacco: Never Used  . Alcohol Use: Yes     Comment: 1-2 x/month  . Drug Use: No  . Sexual Activity: Not on file   Other Topics Concern  . Not on file   Social History Narrative    Review of Systems: See HPI, otherwise negative ROS  Physical  Exam: BP 125/80 mmHg  Pulse 65  Temp(Src) 97.6 F (36.4 C) (Oral)  Resp 20  SpO2 94% General:   Alert,  Well-developed, well-nourished, pleasant and cooperative in NAD Head:  Normocephalic and atraumatic. Eyes:  Sclera clear, no icterus.   Conjunctiva pink. Ears:  Normal auditory acuity. Nose:  No deformity, discharge,  or lesions. Mouth:  No deformity or lesions, dentition normal. Neck:  Supple; no masses or thyromegaly. Lungs:  Clear throughout to auscultation.   No wheezes, crackles, or rhonchi. No acute distress. Heart:  Regular rate and rhythm; no murmurs, clicks, rubs,  or gallops. Abdomen:  Soft, nontender and nondistended. No masses, hepatosplenomegaly or hernias noted. Normal bowel sounds, without guarding, and without rebound.   Msk:  Symmetrical without gross deformities. Normal  posture. Pulses:  Normal pulses noted. Extremities:  Without clubbing or edema. Neurologic:  Alert and  oriented x4;  grossly normal neurologically. Skin:  Intact without significant lesions or rashes. Cervical Nodes:  No significant cervical adenopathy. Psych:  Alert and cooperative. Normal mood and affect.  Impression/Plan: Greg Alvarez is now here to undergo a screening colonoscopy.  First ever average risk screening examination  Risks, benefits, limitations, imponderables and alternatives regarding colonoscopy have been reviewed with the patient. Questions have been answered. All parties agreeable.     Notice:  This dictation was prepared with Dragon dictation along with smaller phrase technology. Any transcriptional errors that result from this process are unintentional and may not be corrected upon review.

## 2014-01-13 ENCOUNTER — Encounter (HOSPITAL_COMMUNITY): Payer: Self-pay | Admitting: Internal Medicine

## 2014-01-19 ENCOUNTER — Ambulatory Visit
Admission: RE | Admit: 2014-01-19 | Discharge: 2014-01-19 | Disposition: A | Discharge status: Home / Self Care | Payer: BC Managed Care – PPO | Source: Ambulatory Visit | Attending: Orthopedic Surgery | Admitting: Orthopedic Surgery

## 2014-01-19 DIAGNOSIS — M25511 Pain in right shoulder: Secondary | ICD-10-CM

## 2014-01-19 MED ORDER — IOHEXOL 180 MG/ML  SOLN
15.0000 mL | Freq: Once | INTRAMUSCULAR | Status: AC | PRN
  Administered 2014-01-19: 15 mL via INTRA_ARTICULAR

## 2016-09-15 DIAGNOSIS — I200 Unstable angina: Secondary | ICD-10-CM | POA: Insufficient documentation

## 2017-12-11 ENCOUNTER — Encounter: Payer: Self-pay | Admitting: Internal Medicine

## 2018-01-30 ENCOUNTER — Telehealth: Payer: Self-pay

## 2018-01-30 NOTE — Telephone Encounter (Signed)
SENT REFERRAL TO SCHEDULING NO NOTES 

## 2018-02-12 ENCOUNTER — Telehealth: Payer: Self-pay

## 2018-02-12 ENCOUNTER — Ambulatory Visit (HOSPITAL_COMMUNITY): Payer: Self-pay | Admitting: Psychiatry

## 2018-02-12 NOTE — Telephone Encounter (Signed)
NO NOTES WITH REFERRAL

## 2018-02-18 ENCOUNTER — Telehealth: Payer: Self-pay | Admitting: Cardiology

## 2018-02-18 NOTE — Telephone Encounter (Signed)
Records received from [facility] on 02/18/18, Appt on 03/13/18 @ 1:20PM. [place]

## 2018-03-10 NOTE — Progress Notes (Addendum)
Cardiology Office Note    Date:  03/13/2018   ID:  Greg Alvarez, DOB 1961/02/05, MRN [id number]  PCP:  Josiah Lobo, MD  Cardiologist:  Duwane Gewirtz [place], MD   Chief Complaint  Patient presents with  . Coronary Artery Disease  . Chest Pain      History of Present Illness: Greg Alvarez is a 58 y.o. male who is seen at the request of Dr. Odetta Pink for evaluation of CAD. He has a history of HTN, HLD, OSA and DM. He presented in 7/18 to [facility] for evaluation of an episode of dizziness, shortness of breath, chest tightness,and aphasia. His troponin was negative and a stress echocardiogram was ordered. His stress test was positive for inducible ischemia. EF was normal at rest. Patient had a left heart cath on 09/14/2016. Cath was significant for 75% prox ramus, 70% RPDA, 60% RPL and 75% mCx (small). Culprit lesion was a 90% prox/mid LAD lesion beginning at 1st septal and spans into mid segment. A 2.75 x 28 mm Promus stent was placed in the LAD and post dilated to 3.25 mm.  Since this intervention he has been followed at [facility]. He denies any dyspnea. He did experience a couple episodes of chest pain in November. This felt like his whole chest tightened up and he had discomfort lateral to his left breast. This was different from symptoms he experienced prior to his stent. He thinks it was related to anxiety and stress.  He report Plavix was discontinued 4 months ago.    He does have a history of OSA. Was told in the past it was severe. He was on CPAP with improvement in his symptoms but states he hasn't been able to tolerate CPAP or nasal Bipap due to his panic disorder. He does have RA with arthritis and pain. This limits his activity. Last A1c was 9.1%. BP has been controlled.      Past Medical History:  Diagnosis Date  . Coronary artery disease   . Diabetes mellitus type 2, noninsulin dependent (HCC)   . High cholesterol   . Hypertension    under control  with med., has been on med. x 10 yr.  . 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 Panic attacks   . Rheumatoid arthritis (HCC)   . Tenosynovitis of wrist 03/2013   right  . Wears glasses     Past Surgical History:  Procedure Laterality Date  . APPENDECTOMY  1980  . CARDIAC CATHETERIZATION  2002    states no problem - was done in [place]  . CARPAL TUNNEL RELEASE Left 07/29/2013   Procedure: LEFT CARPAL TUNNEL RELEASE;  Surgeon: Nicki Reaper, MD;  Location: [facility];  Service: Orthopedics;  Laterality: Left;  . COLONOSCOPY N/A 01/12/2014   Procedure: COLONOSCOPY;  Surgeon: Corbin Ade, MD;  [facility];  Service: Endoscopy;  Laterality: N/A;  9:30 AM  . SHOULDER ARTHROSCOPY W/ ROTATOR CUFF REPAIR  4/15   left-DSC  . TENDON REPAIR Right    hand  . TENOSYNOVECTOMY Right 04/08/2013   Procedure: TENOSYNOVECTOMY/FLEXOR TENDONS RIGHT WRIST;  Surgeon: Nicki Reaper, MD;  Location: [facility];  Service: Orthopedics;  Laterality: Right;     Current Outpatient Medications  Medication Sig Dispense Refill  . amLODipine (NORVASC) 5 MG tablet Take 5 mg by mouth daily.    Marland Kitchen aspirin 81 MG  tablet Take 81 mg by mouth daily.    Marland Kitchen atorvastatin (LIPITOR) 40 MG tablet Take 40 mg by mouth daily.    . calcium gluconate 500 MG tablet Take 1 tablet by mouth 3 (three) times daily.    . citalopram (CELEXA) 20 MG tablet Take 20 mg by mouth daily.    Marland Kitchen co-enzyme Q-10 30 MG capsule Take 30 mg by mouth daily.     . folic acid (FOLVITE) 1 MG tablet Take 1 mg by mouth daily.    Marland Kitchen losartan (COZAAR) 50 MG tablet Take 50 mg by mouth daily.    . Magnesium Oxide 420 MG TABS Take by mouth.    . metFORMIN (GLUCOPHAGE) 500 MG tablet Take 500 mg by mouth daily with breakfast.    . Methotrexate Sodium, PF, 50 MG/2ML SOLN 0.6 mLs once a week.   1  . Multiple Vitamin (ONE-A-DAY MENS PO) Take by mouth.    . pantoprazole (PROTONIX) 40 MG  tablet Take 40 mg by mouth daily.    . Semaglutide (OZEMPIC, 0.25 OR 0.5 MG/DOSE, Arp) Inject 0.5 mg into the skin.    Marland Kitchen traZODone (DESYREL) 100 MG tablet Take 100 mg by mouth at bedtime.     No current facility-administered medications for this visit.     Allergies:   Ativan [lorazepam]    Social History:  The patient  reports that he quit smoking about 20 years ago. His smoking use included cigarettes. He has never used smokeless tobacco. He reports current alcohol use. He reports that he does not use drugs.   Family History:  The patient's family history includes Anesthesia problems in his sister; Epilepsy in his mother; Heart attack in his brother and father; Heart disease in his brother and father; Heart failure in his father; Multiple sclerosis in his sister; Parkinson's disease in his father and sister; Stroke in his brother.    ROS:  Please see the history of present illness.   Otherwise, review of systems are positive for .   All other systems are reviewed and negative.    PHYSICAL EXAM: VS:  BP 130/82 (BP Location: Left Arm, Patient Position: Sitting, Cuff Size: Normal)   Pulse 72   Ht 5\' 6"  (1.676 m)   Wt 253 lb (114.8 kg)   BMI 40.84 kg/m  , BMI Body mass index is 40.84 kg/m. GEN: Well nourished,obese WM , in no acute distress  HEENT: normal  Neck: no JVD, carotid bruits, or masses Cardiac: RRR; no murmurs, rubs, or gallops,no edema  Respiratory:  clear to auscultation bilaterally, normal work of breathing GI: soft, nontender, nondistended, + BS MS: no deformity or atrophy  Skin: warm and dry, no rash Neuro:  Strength and sensation are intact Psych: euthymic mood, full affect   EKG:  EKG is ordered today. The ekg ordered today demonstrates NSR with normal Ecg. I have personally reviewed and interpreted this study.    Recent Labs: No results found for requested labs within last 8760 hours.    Lipid Panel    Component Value Date/Time   CHOL  03/11/2008 0540      198        ATP III CLASSIFICATION:  <200     mg/dL   Desirable  920-100  mg/dL   Borderline High  >=712    mg/dL   High          TRIG 197 (H) 03/11/2008 0540   HDL 24 (L) 03/11/2008 0540   CHOLHDL 8.3  03/11/2008 0540   VLDL 50 (H) 03/11/2008 0540   LDLCALC (H) 03/11/2008 0540    124        Total Cholesterol/HDL:CHD Risk Coronary Heart Disease Risk Table                     Men   Women  1/2 Average Risk   3.4   3.3  Average Risk       5.0   4.4  2 X Average Risk   9.6   7.1  3 X Average Risk  23.4   11.0        Use the calculated Patient Ratio above and the CHD Risk Table to determine the patient's CHD Risk.        ATP III CLASSIFICATION (LDL):  <100     mg/dL   Optimal  [id number]  mg/dL   Near or Above                    Optimal  130-159  mg/dL   Borderline  [id number]  mg/dL   High  >811>190     mg/dL   Very High  Labs dated 12/17/13: WBC 10.6. platelets 416K. Hgb 14.3. Cholesterol 295, triglycerides 281, HDL 35, LDL 204. Aic 6.4%. creatinine normal.     Labs dated 09/15/16: glucose 160. Other chemistries normal. Cholesterol 150, triglycerides 314, HDL 27, LDL 86. Hgb 13.5.   [facility] one year ago: cholesterol 102, triglycerides 88, HDL 32, LDL 52.   Wt Readings from Last 3 Encounters:  03/13/18 253 lb (114.8 kg)  07/29/13 244 lb 8 oz (110.9 kg)  06/17/13 245 lb (111.1 kg)      Other studies Reviewed: Additional studies/ records that were reviewed today include: see HPiw   ASSESSMENT AND PLAN:  1.  CAD s/p NSTEMI in July 2018. Had DES of RCA. Moderate residual disease in multiple branches. EF normal. On ASA. He did experience chest pain in November related to stress. On amlodipine. I have recommended a Lexiscan Myoview to assess residual ischemic risk. I have [facility]. 2. DM type 2 not on insulin. Per primary care 3. Hypercholesterolemia. On lipitor 40 mg daily and fish oil 2 grams daily. Last lipid panel looked excellent one  year ago. Will update 4. HTN controlled. 5. Obesity with OSA. Intolerant of mask due to panic attacks- [facility]  6. RA on methotrexate   Current medicines are reviewed at length with the patient today.  The patient does not have concerns regarding medicines.  The following changes have been made:  no change  Labs/ tests ordered today include:   Orders Placed This Encounter  Procedures  . Basic metabolic panel  . Lipid panel  . Hepatic function panel  . Basic metabolic panel  . Lipid panel  . Hepatic function panel  . Myocardial Perfusion Imaging  . EKG 12-Lead     Disposition:   FU with me in 6 months depending on results of Myoview study.  Signed, Deandre Brannan [place], MD  03/13/2018 3:58 PM    [facility] [address], [place], [place], [address] Phone [phone], Fax [phone]  Addendum: I personally reviewed cardiac cath films [facility] on 09/14/16 and concur with the findings noted above.

## 2018-03-13 ENCOUNTER — Encounter: Payer: Self-pay | Admitting: Cardiology

## 2018-03-13 ENCOUNTER — Ambulatory Visit: Payer: Medicare HMO | Admitting: Cardiology

## 2018-03-13 VITALS — BP 130/82 | HR 72 | Ht 66.0 in | Wt 253.0 lb

## 2018-03-13 DIAGNOSIS — E782 Mixed hyperlipidemia: Secondary | ICD-10-CM

## 2018-03-13 DIAGNOSIS — I10 Essential (primary) hypertension: Secondary | ICD-10-CM | POA: Diagnosis not present

## 2018-03-13 DIAGNOSIS — E119 Type 2 diabetes mellitus without complications: Secondary | ICD-10-CM

## 2018-03-13 DIAGNOSIS — M0579 Rheumatoid arthritis with rheumatoid factor of multiple sites without organ or systems involvement: Secondary | ICD-10-CM

## 2018-03-13 DIAGNOSIS — I25118 Atherosclerotic heart disease of native coronary artery with other forms of angina pectoris: Secondary | ICD-10-CM

## 2018-03-13 NOTE — Patient Instructions (Addendum)
We will schedule you for a nuclear stress test Eugenie Birks )   We will request a copy of your cardiac cath films  We will check your lipids

## 2018-03-14 ENCOUNTER — Telehealth (HOSPITAL_COMMUNITY): Payer: Self-pay

## 2018-03-14 NOTE — Telephone Encounter (Signed)
Encounter complete. 

## 2018-03-15 ENCOUNTER — Ambulatory Visit (HOSPITAL_COMMUNITY)
Admission: RE | Admit: 2018-03-15 | Discharge: 2018-03-15 | Disposition: A | Discharge status: Home / Self Care | Payer: Medicare HMO | Source: Ambulatory Visit | Attending: Internal Medicine | Admitting: Internal Medicine

## 2018-03-15 DIAGNOSIS — E782 Mixed hyperlipidemia: Secondary | ICD-10-CM | POA: Insufficient documentation

## 2018-03-15 DIAGNOSIS — I25118 Atherosclerotic heart disease of native coronary artery with other forms of angina pectoris: Secondary | ICD-10-CM | POA: Diagnosis present

## 2018-03-15 DIAGNOSIS — E119 Type 2 diabetes mellitus without complications: Secondary | ICD-10-CM | POA: Diagnosis present

## 2018-03-15 DIAGNOSIS — M0579 Rheumatoid arthritis with rheumatoid factor of multiple sites without organ or systems involvement: Secondary | ICD-10-CM | POA: Diagnosis present

## 2018-03-15 DIAGNOSIS — I10 Essential (primary) hypertension: Secondary | ICD-10-CM | POA: Diagnosis present

## 2018-03-15 LAB — MYOCARDIAL PERFUSION IMAGING
CHL CUP RESTING HR STRESS: 71 {beats}/min
LV dias vol: 136 mL (ref 62–150)
LV sys vol: 60 mL
Peak HR: 87 {beats}/min
SDS: 0
SRS: 1
SSS: 1
TID: 1.31

## 2018-03-15 MED ORDER — REGADENOSON 0.4 MG/5ML IV SOLN
0.4000 mg | Freq: Once | INTRAVENOUS | Status: AC
  Administered 2018-03-15: 0.4 mg via INTRAVENOUS

## 2018-03-15 MED ORDER — TECHNETIUM TC 99M TETROFOSMIN IV KIT
33.0000 | PACK | Freq: Once | INTRAVENOUS | Status: AC | PRN
  Administered 2018-03-15: 33 via INTRAVENOUS
  Filled 2018-03-15: qty 33

## 2018-03-15 MED ORDER — TECHNETIUM TC 99M TETROFOSMIN IV KIT
10.7000 | PACK | Freq: Once | INTRAVENOUS | Status: AC | PRN
  Administered 2018-03-15: 10.7 via INTRAVENOUS
  Filled 2018-03-15: qty 11

## 2018-03-16 LAB — BASIC METABOLIC PANEL
BUN/Creatinine Ratio: 15 (ref 9–20)
BUN: 12 mg/dL (ref 6–24)
CO2: 23 mmol/L (ref 20–29)
Calcium: 9.5 mg/dL (ref 8.7–10.2)
Chloride: 97 mmol/L (ref 96–106)
Creatinine, Ser: 0.8 mg/dL (ref 0.76–1.27)
GFR calc Af Amer: 116 mL/min/{1.73_m2} (ref >59)
GFR calc non Af Amer: 101 mL/min/{1.73_m2} (ref >59)
Glucose: 179 mg/dL — ABNORMAL HIGH (ref 65–99)
Potassium: 4.3 mmol/L (ref 3.5–5.2)
Sodium: 137 mmol/L (ref 134–144)

## 2018-03-16 LAB — HEPATIC FUNCTION PANEL
ALT: 54 IU/L — ABNORMAL HIGH (ref 0–44)
AST: 33 IU/L (ref 0–40)
Albumin: 4.7 g/dL (ref 3.5–5.5)
Alkaline Phosphatase: 79 IU/L (ref 39–117)
Bilirubin Total: 0.4 mg/dL (ref 0.0–1.2)
Bilirubin, Direct: 0.11 mg/dL (ref 0.00–0.40)
Total Protein: 7.1 g/dL (ref 6.0–8.5)

## 2018-03-16 LAB — LIPID PANEL
Chol/HDL Ratio: 4.2 ratio (ref 0.0–5.0)
Cholesterol, Total: 140 mg/dL (ref 100–199)
HDL: 33 mg/dL — ABNORMAL LOW (ref >39)
LDL Calculated: 66 mg/dL (ref 0–99)
Triglycerides: 204 mg/dL — ABNORMAL HIGH (ref 0–149)
VLDL Cholesterol Cal: 41 mg/dL — ABNORMAL HIGH (ref 5–40)

## 2018-03-18 ENCOUNTER — Telehealth: Payer: Self-pay

## 2018-03-18 ENCOUNTER — Ambulatory Visit (INDEPENDENT_AMBULATORY_CARE_PROVIDER_SITE_OTHER): Payer: No Typology Code available for payment source | Admitting: Nurse Practitioner

## 2018-03-18 ENCOUNTER — Encounter: Payer: Self-pay | Admitting: Nurse Practitioner

## 2018-03-18 ENCOUNTER — Other Ambulatory Visit: Payer: Self-pay

## 2018-03-18 DIAGNOSIS — R110 Nausea: Secondary | ICD-10-CM | POA: Diagnosis not present

## 2018-03-18 DIAGNOSIS — K219 Gastro-esophageal reflux disease without esophagitis: Secondary | ICD-10-CM | POA: Diagnosis not present

## 2018-03-18 MED ORDER — PANTOPRAZOLE SODIUM 40 MG PO TBEC: 40.0000 mg | DELAYED_RELEASE_TABLET | Freq: Two times a day (BID) | ORAL | 1 refills | Status: DC

## 2018-03-18 NOTE — Assessment & Plan Note (Signed)
The patient describes nausea without vomiting.  This is in addition to significant and worsening GERD symptoms.  Likely interrelated.  I feel his nausea will likely improve when we improve his GERD symptoms.  See GERD management above.  Follow-up in 3 months.

## 2018-03-18 NOTE — Patient Instructions (Signed)
Your health issues we discussed today were:   Heartburn (GERD): 1. Increase your Protonix to 40 mg twice a day.  Take on an empty stomach, 30 minutes before breakfast and 30 minutes before dinner. 2. We will schedule your upper endoscopy for you. 3. Further recommendations will follow your endoscopy  Nausea: 1. I feel your nausea is likely at least in part due to your significant and worsening heartburn. 2. We will treat your heartburn as per above and your nausea should improve with treatment. 3. Call us for any worsening or severe symptoms  Overall I recommend:  1. Follow-up in 3 months. 2. Call us if you have any questions or concerns.  At [facility] we value your feedback. You may receive a survey about your visit today. Please share your experience as we strive to create trusting relationships with our patients to provide genuine, compassionate, quality care.  We appreciate your understanding and patience as we review any laboratory studies, imaging, and other diagnostic tests that are ordered as we care for you. Our office policy is 5 business days for review of these results, and any emergent or urgent results are addressed in a timely manner for your best interest. If you do not hear from our office in 1 week, please contact us.   We also encourage the use of MyChart, which contains your medical information for your review as well. If you are not enrolled in this feature, an access code is on this after visit summary for your convenience. Thank you for allowing [place] to be involved in your care.  It was great to see you today!  I hope you have a great day!!

## 2018-03-18 NOTE — Assessment & Plan Note (Signed)
The patient describes chronic GERD.  This is worsened significantly in the past year.  It is associated with nausea as per below.  He is currently on Protonix once daily.  I will increase his Protonix to twice daily.  We will plan for an EGD on propofol/MAC as requested by the [place].  Follow-up in 3 months.  Proceed with EGD on propofol/MAC with Dr. Gala Romney in near future: the risks, benefits, and alternatives have been discussed with the patient in detail. The patient states understanding and desires to proceed.  The patient is currently on trazodone, Celexa.  No other anticoagulants, anxiolytics, chronic pain medications, or antidepressants.  We will plan for the procedure on propofol/MAC to promote adequate sedation.

## 2018-03-18 NOTE — Progress Notes (Signed)
Primary Care Physician:  Tsosie Billing, MD Primary Gastroenterologist:  Dr. Gala Romney  Chief Complaint  Patient presents with  . Nausea    no vomiting. Happens around 4 pm  . Gastroesophageal Reflux    feels acid in throat in middle of night, burning in throat    HPI:   Greg Alvarez is a 58 y.o. male who presents for nausea without vomiting and GERD.  [facility]MAC.  Reviewed information provided with referral including visit notes dated 12/03/2017.  The patient was complaining of nausea and GERD.  They are requesting EGD via monitored anesthesia care (MAC) to further evaluate..  No history of endoscopy in our system.  Today he states he's doing ok overall. A bit tired as he had to drive from [place] last night. Has had GERD for years, gotten worse over the last year. GERD symptoms include nighttime throat burning, esophageal burning, regurgitation, frequent nausea without vomiting. Denies abdominal pain. Denies dysphagia. Denies hematochezia, melena, fever, chills, unintentional weight loss. Has had chest pains and dyspnea with stress test just completed at cardiology in [facility] which was normal. Does have a history of coronary artery stent placed 08/2016. Denies dizziness, lightheadedness, syncope, near syncope. Denies any other upper or lower GI symptoms.  Past Medical History:  Diagnosis Date  . Coronary artery disease   . Diabetes mellitus type 2, noninsulin dependent ([place])   . High cholesterol   . Hypertension    under control with med., has been on med. x 10 yr.  . 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 Panic attacks   . Rheumatoid arthritis ([place])   . Tenosynovitis of wrist 03/2013   right  . Wears glasses     Past Surgical History:  Procedure Laterality Date  . APPENDECTOMY  1980  . CARDIAC CATHETERIZATION  2002    states no problem - was done in [place]    . CARPAL TUNNEL RELEASE Left 07/29/2013   Procedure: LEFT CARPAL TUNNEL RELEASE;  Surgeon: Wynonia Sours, MD;  Location: [facility];  Service: Orthopedics;  Laterality: Left;  . COLONOSCOPY N/A 01/12/2014   Procedure: COLONOSCOPY;  Surgeon: Daneil Dolin, MD;  [facility];  Service: Endoscopy;  Laterality: N/A;  9:30 AM  . SHOULDER ARTHROSCOPY W/ ROTATOR CUFF REPAIR  4/15   left-DSC  . TENDON REPAIR Right    hand  . TENOSYNOVECTOMY Right 04/08/2013   Procedure: TENOSYNOVECTOMY/FLEXOR TENDONS RIGHT WRIST;  Surgeon: Wynonia Sours, MD;  Location: [facility];  Service: Orthopedics;  Laterality: Right;    Current Outpatient Medications  Medication Sig Dispense Refill  . amLODipine (NORVASC) 5 MG tablet Take 5 mg by mouth daily.    Marland Kitchen aspirin 81 MG tablet Take 81 mg by mouth daily.    Marland Kitchen atorvastatin (LIPITOR) 40 MG tablet Take 40 mg by mouth daily.    . calcium gluconate 500 MG tablet Take 1 tablet by mouth 2 (two) times daily.     . citalopram (CELEXA) 20 MG tablet Take 40 mg by mouth daily.     . folic acid (FOLVITE) 1 MG tablet Take 1 mg by mouth daily.    Marland Kitchen losartan (COZAAR) 50 MG tablet Take 50 mg by mouth daily.    . Magnesium Oxide 420 MG TABS Take by mouth.    . metFORMIN (GLUCOPHAGE) 500  MG tablet Take 500 mg by mouth 2 (two) times daily with a meal.     . Methotrexate Sodium, PF, 50 MG/2ML SOLN 0.6 mLs once a week.   1  . Multiple Vitamin (ONE-A-DAY MENS PO) Take by mouth.    . pantoprazole (PROTONIX) 40 MG tablet Take 40 mg by mouth daily.    . Semaglutide (OZEMPIC, 0.25 OR 0.5 MG/DOSE, [place]) Inject 0.5 mg into the skin.    Marland Kitchen traZODone (DESYREL) 100 MG tablet Take 100 mg by mouth at bedtime.    Marland Kitchen co-enzyme Q-10 30 MG capsule Take 30 mg by mouth daily.      No current facility-administered medications for this visit.     Allergies as of 03/18/2018 - Review Complete 03/18/2018  Allergen Reaction Noted  . Ativan [lorazepam] Other (See  Comments) 04/04/2013    Family History  Problem Relation Age of Onset  . Anesthesia problems Sister        hard to wake up post-op  . Parkinson's disease Sister   . Multiple sclerosis Sister   . Epilepsy Mother   . Heart disease Father   . Parkinson's disease Father   . Heart failure Father   . Heart attack Father   . Heart disease Brother   . Heart attack Brother   . Stroke Brother   . Colon cancer Neg Hx   . Gastric cancer Neg Hx   . Esophageal cancer Neg Hx     Social History   Socioeconomic History  . Marital status: Divorced    Spouse name: Not on file  . Number of children: Not on file  . Years of education: Not on file  . Highest education level: Not on file  Occupational History  . Not on file  Social Needs  . Financial resource strain: Not on file  . Food insecurity:    Worry: Not on file    Inability: Not on file  . Transportation needs:    Medical: Not on file    Non-medical: Not on file  Tobacco Use  . Smoking status: Former Smoker    Types: Cigarettes    Last attempt to quit: 03/13/1998    Years since quitting: 20.0  . Smokeless tobacco: Never Used  Substance and Sexual Activity  . Alcohol use: Yes    Comment: 1-2 x/month  . Drug use: No  . Sexual activity: Not on file  Lifestyle  . Physical activity:    Days per week: Not on file    Minutes per session: Not on file  . Stress: Not on file  Relationships  . Social connections:    Talks on phone: Not on file    Gets together: Not on file    Attends religious service: Not on file    Active member of club or organization: Not on file    Attends meetings of clubs or organizations: Not on file    Relationship status: Not on file  . Intimate partner violence:    Fear of current or ex partner: Not on file    Emotionally abused: Not on file    Physically abused: Not on file    Forced sexual activity: Not on file  Other Topics Concern  . Not on file  Social History Narrative  . Not on file     Review of Systems: General: Negative for anorexia, weight loss, fever, chills, fatigue, weakness. ENT: Negative for hoarseness, difficulty swallowing , nasal congestion. CV: Negative for chest pain, angina,  palpitations, peripheral edema.  Respiratory: Negative for dyspnea at rest, cough, sputum, wheezing.  GI: See history of present illness. MS: Negative for joint pain, low back pain.  Derm: Negative for rash or itching.  Endo: Negative for unusual weight change.  Heme: Negative for bruising or bleeding. Allergy: Negative for rash or hives.    Physical Exam: BP 132/83   Pulse 73   Temp 98.1 F (36.7 C) (Oral)   Ht _0  (1.676 m)   Wt 255 lb 3.2 oz (115.8 kg)   BMI 41.19 kg/m  General:   Alert and oriented. Pleasant and cooperative. Well-nourished and well-developed.  Head:  Normocephalic and atraumatic. Eyes:  Without icterus, sclera clear and conjunctiva pink.  Ears:  Normal auditory acuity. Cardiovascular:  S1, S2 present without murmurs appreciated. Extremities without clubbing or edema. Respiratory:  Clear to auscultation bilaterally. No wheezes, rales, or rhonchi. No distress.  Gastrointestinal:  +BS, soft, non-tender and non-distended. No HSM noted. No guarding or rebound. No masses appreciated.  Rectal:  Deferred  Musculoskalatal:  Symmetrical without gross deformities. Skin:  Intact without significant lesions or rashes. Neurologic:  Alert and oriented x4;  grossly normal neurologically. Psych:  Alert and cooperative. Normal mood and affect. Heme/Lymph/Immune: No excessive bruising noted.    03/18/2018 12:25 PM   Disclaimer: This note was dictated with voice recognition software. Similar sounding words can inadvertently be transcribed and may not be corrected upon review.

## 2018-03-18 NOTE — Telephone Encounter (Signed)
Called pt to schedule EGD w/Propofol w/RMR. EGD scheduled for 04/11/18 at 1:15pm. Orders entered. Will mail instructions after pre-op appt is scheduled.

## 2018-03-18 NOTE — H&P (View-Only) (Signed)
Primary Care Physician:  Tsosie Billing, MD Primary Gastroenterologist:  Dr. Gala Romney  Chief Complaint  Patient presents with  . Nausea    no vomiting. Happens around 4 pm  . Gastroesophageal Reflux    feels acid in throat in middle of night, burning in throat    HPI:   Greg Alvarez is a 58 y.o. male who presents for nausea without vomiting and GERD.  [facility]MAC.  Reviewed information provided with referral including visit notes dated 12/03/2017.  The patient was complaining of nausea and GERD.  They are requesting EGD via monitored anesthesia care (MAC) to further evaluate..  No history of endoscopy in our system.  Today he states he's doing ok overall. A bit tired as he had to drive from [place] last night. Has had GERD for years, gotten worse over the last year. GERD symptoms include nighttime throat burning, esophageal burning, regurgitation, frequent nausea without vomiting. Denies abdominal pain. Denies dysphagia. Denies hematochezia, melena, fever, chills, unintentional weight loss. Has had chest pains and dyspnea with stress test just completed at cardiology in [facility] which was normal. Does have a history of coronary artery stent placed 08/2016. Denies dizziness, lightheadedness, syncope, near syncope. Denies any other upper or lower GI symptoms.  Past Medical History:  Diagnosis Date  . Coronary artery disease   . Diabetes mellitus type 2, noninsulin dependent ([place])   . High cholesterol   . Hypertension    under control with med., has been on med. x 10 yr.  . 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 Panic attacks   . Rheumatoid arthritis ([place])   . Tenosynovitis of wrist 03/2013   right  . Wears glasses     Past Surgical History:  Procedure Laterality Date  . APPENDECTOMY  1980  . CARDIAC CATHETERIZATION  2002    states no problem - was done in [place]    . CARPAL TUNNEL RELEASE Left 07/29/2013   Procedure: LEFT CARPAL TUNNEL RELEASE;  Surgeon: Wynonia Sours, MD;  Location: [facility];  Service: Orthopedics;  Laterality: Left;  . COLONOSCOPY N/A 01/12/2014   Procedure: COLONOSCOPY;  Surgeon: Daneil Dolin, MD;  [facility];  Service: Endoscopy;  Laterality: N/A;  9:30 AM  . SHOULDER ARTHROSCOPY W/ ROTATOR CUFF REPAIR  4/15   left-DSC  . TENDON REPAIR Right    hand  . TENOSYNOVECTOMY Right 04/08/2013   Procedure: TENOSYNOVECTOMY/FLEXOR TENDONS RIGHT WRIST;  Surgeon: Wynonia Sours, MD;  Location: [facility];  Service: Orthopedics;  Laterality: Right;    Current Outpatient Medications  Medication Sig Dispense Refill  . amLODipine (NORVASC) 5 MG tablet Take 5 mg by mouth daily.    Marland Kitchen aspirin 81 MG tablet Take 81 mg by mouth daily.    Marland Kitchen atorvastatin (LIPITOR) 40 MG tablet Take 40 mg by mouth daily.    . calcium gluconate 500 MG tablet Take 1 tablet by mouth 2 (two) times daily.     . citalopram (CELEXA) 20 MG tablet Take 40 mg by mouth daily.     . folic acid (FOLVITE) 1 MG tablet Take 1 mg by mouth daily.    Marland Kitchen losartan (COZAAR) 50 MG tablet Take 50 mg by mouth daily.    . Magnesium Oxide 420 MG TABS Take by mouth.    . metFORMIN (GLUCOPHAGE) 500  MG tablet Take 500 mg by mouth 2 (two) times daily with a meal.     . Methotrexate Sodium, PF, 50 MG/2ML SOLN 0.6 mLs once a week.   1  . Multiple Vitamin (ONE-A-DAY MENS PO) Take by mouth.    . pantoprazole (PROTONIX) 40 MG tablet Take 40 mg by mouth daily.    . Semaglutide (OZEMPIC, 0.25 OR 0.5 MG/DOSE, [place]) Inject 0.5 mg into the skin.    . traZODone (DESYREL) 100 MG tablet Take 100 mg by mouth at bedtime.    . co-enzyme Q-10 30 MG capsule Take 30 mg by mouth daily.      No current facility-administered medications for this visit.     Allergies as of 03/18/2018 - Review Complete 03/18/2018  Allergen Reaction Noted  . Ativan [lorazepam] Other (See  Comments) 04/04/2013    Family History  Problem Relation Age of Onset  . Anesthesia problems Sister        hard to wake up post-op  . Parkinson's disease Sister   . Multiple sclerosis Sister   . Epilepsy Mother   . Heart disease Father   . Parkinson's disease Father   . Heart failure Father   . Heart attack Father   . Heart disease Brother   . Heart attack Brother   . Stroke Brother   . Colon cancer Neg Hx   . Gastric cancer Neg Hx   . Esophageal cancer Neg Hx     Social History   Socioeconomic History  . Marital status: Divorced    Spouse name: Not on file  . Number of children: Not on file  . Years of education: Not on file  . Highest education level: Not on file  Occupational History  . Not on file  Social Needs  . Financial resource strain: Not on file  . Food insecurity:    Worry: Not on file    Inability: Not on file  . Transportation needs:    Medical: Not on file    Non-medical: Not on file  Tobacco Use  . Smoking status: Former Smoker    Types: Cigarettes    Last attempt to quit: 03/13/1998    Years since quitting: 20.0  . Smokeless tobacco: Never Used  Substance and Sexual Activity  . Alcohol use: Yes    Comment: 1-2 x/month  . Drug use: No  . Sexual activity: Not on file  Lifestyle  . Physical activity:    Days per week: Not on file    Minutes per session: Not on file  . Stress: Not on file  Relationships  . Social connections:    Talks on phone: Not on file    Gets together: Not on file    Attends religious service: Not on file    Active member of club or organization: Not on file    Attends meetings of clubs or organizations: Not on file    Relationship status: Not on file  . Intimate partner violence:    Fear of current or ex partner: Not on file    Emotionally abused: Not on file    Physically abused: Not on file    Forced sexual activity: Not on file  Other Topics Concern  . Not on file  Social History Narrative  . Not on file     Review of Systems: General: Negative for anorexia, weight loss, fever, chills, fatigue, weakness. ENT: Negative for hoarseness, difficulty swallowing , nasal congestion. CV: Negative for chest pain, angina,   palpitations, peripheral edema.  Respiratory: Negative for dyspnea at rest, cough, sputum, wheezing.  GI: See history of present illness. MS: Negative for joint pain, low back pain.  Derm: Negative for rash or itching.  Endo: Negative for unusual weight change.  Heme: Negative for bruising or bleeding. Allergy: Negative for rash or hives.    Physical Exam: BP 132/83   Pulse 73   Temp 98.1 F (36.7 C) (Oral)   Ht _0  (1.676 m)   Wt 255 lb 3.2 oz (115.8 kg)   BMI 41.19 kg/m  General:   Alert and oriented. Pleasant and cooperative. Well-nourished and well-developed.  Head:  Normocephalic and atraumatic. Eyes:  Without icterus, sclera clear and conjunctiva pink.  Ears:  Normal auditory acuity. Cardiovascular:  S1, S2 present without murmurs appreciated. Extremities without clubbing or edema. Respiratory:  Clear to auscultation bilaterally. No wheezes, rales, or rhonchi. No distress.  Gastrointestinal:  +BS, soft, non-tender and non-distended. No HSM noted. No guarding or rebound. No masses appreciated.  Rectal:  Deferred  Musculoskalatal:  Symmetrical without gross deformities. Skin:  Intact without significant lesions or rashes. Neurologic:  Alert and oriented x4;  grossly normal neurologically. Psych:  Alert and cooperative. Normal mood and affect. Heme/Lymph/Immune: No excessive bruising noted.    03/18/2018 12:25 PM   Disclaimer: This note was dictated with voice recognition software. Similar sounding words can inadvertently be transcribed and may not be corrected upon review.

## 2018-03-19 NOTE — Telephone Encounter (Signed)
Tried to call pt to inform of pre-op appt 04/04/18 at 12:45pm, no answer, LMOVM. Letter mailed with procedure instructions.

## 2018-03-25 NOTE — Progress Notes (Signed)
CC'D TO PCP °

## 2018-03-28 ENCOUNTER — Telehealth: Payer: Self-pay

## 2018-03-28 NOTE — Telephone Encounter (Signed)
T/C from [place] at [facility], asking if [facility] is using [facility] for the [facility]GD. Her call back number [phone]. Forwarding to [place] who scheduled.

## 2018-03-28 NOTE — Telephone Encounter (Signed)
Referred from [facility] for EGD (auth in chart). LMOVM to inform Jasmine December.

## 2018-04-03 ENCOUNTER — Encounter (HOSPITAL_COMMUNITY): Payer: Self-pay

## 2018-04-03 NOTE — Progress Notes (Deleted)
Psychiatric Initial Adult Assessment   Patient Identification: Greg Alvarez MRN:  [id number] Date of Evaluation:  04/03/2018 [facility] Chief Complaint:   Visit Diagnosis: No diagnosis found.  History of Present Illness:    Greg Alvarez is a 58 y.o. year old male with a history of depression, CAD s/p NSEMI, HTN, HLD, OSA and DM, who is referred for depression.     Associated Signs/Symptoms: Depression Symptoms:  {DEPRESSION SYMPTOMS:20000} (Hypo) Manic Symptoms:  {BHH MANIC SYMPTOMS:22872} Anxiety Symptoms:  {BHH ANXIETY SYMPTOMS:22873} Psychotic Symptoms:  {BHH PSYCHOTIC SYMPTOMS:22874} PTSD Symptoms: {BHH PTSD SYMPTOMS:22875}  Past Psychiatric History:  Outpatient:  Psychiatry admission:  Previous suicide attempt:  Past trials of medication:  History of violence:   Previous Psychotropic Medications: {YES/NO:21197}  Substance Abuse History in the last 12 months:  {yes no:314532}  Consequences of Substance Abuse: {BHH CONSEQUENCES OF SUBSTANCE ABUSE:22880}  Past Medical History:  Past Medical History:  Diagnosis Date  . Coronary artery disease   . Diabetes mellitus type 2, noninsulin dependent (HCC)   . High cholesterol   . Hypertension    under control with med., has been on med. x 10 yr.  . 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 Panic attacks   . Rheumatoid arthritis (HCC)   . Tenosynovitis of wrist 03/2013   right  . Wears glasses     Past Surgical History:  Procedure Laterality Date  . APPENDECTOMY  1980  . CARDIAC CATHETERIZATION  2002    states no problem - was done in [place]  . CARPAL TUNNEL RELEASE Left 07/29/2013   Procedure: LEFT CARPAL TUNNEL RELEASE;  Surgeon: Nicki ReaperGary R Kuzma, MD;  Location: [facility];  Service: Orthopedics;  Laterality: Left;  . COLONOSCOPY N/A 01/12/2014   Procedure: COLONOSCOPY;  Surgeon: Corbin Adeobert M Rourk, MD;  [facility];  Service: Endoscopy;   Laterality: N/A;  9:30 AM  . SHOULDER ARTHROSCOPY W/ ROTATOR CUFF REPAIR  4/15   left-DSC  . TENDON REPAIR Right    hand  . TENOSYNOVECTOMY Right 04/08/2013   Procedure: TENOSYNOVECTOMY/FLEXOR TENDONS RIGHT WRIST;  Surgeon: Nicki ReaperGary R Kuzma, MD;  Location: [facility];  Service: Orthopedics;  Laterality: Right;    Family Psychiatric History: ***  Family History:  Family History  Problem Relation Age of Onset  . Anesthesia problems Sister        hard to wake up post-op  . Parkinson's disease Sister   . Multiple sclerosis Sister   . Epilepsy Mother   . Heart disease Father   . Parkinson's disease Father   . Heart failure Father   . Heart attack Father   . Heart disease Brother   . Heart attack Brother   . Stroke Brother   . Colon cancer Neg Hx   . Gastric cancer Neg Hx   . Esophageal cancer Neg Hx     Social History:   Social History   Socioeconomic History  . Marital status: Divorced    Spouse name: Not on file  . Number of children: Not on file  . Years of education: Not on file  . Highest education level: Not on file  Occupational History  . Not on file  Social Needs  . Financial resource strain: Not on file  . Food insecurity:    Worry: Not on file    Inability: Not on file  . Transportation needs:    Medical:  Not on file    Non-medical: Not on file  Tobacco Use  . Smoking status: Former Smoker    Types: Cigarettes    Last attempt to quit: 03/13/1998    Years since quitting: 20.0  . Smokeless tobacco: Never Used  Substance and Sexual Activity  . Alcohol use: Yes    Comment: 1-2 x/month  . Drug use: No  . Sexual activity: Not on file  Lifestyle  . Physical activity:    Days per week: Not on file    Minutes per session: Not on file  . Stress: Not on file  Relationships  . Social connections:    Talks on phone: Not on file    Gets together: Not on file    Attends religious service: Not on file    Active member of club or organization: Not  on file    Attends meetings of clubs or organizations: Not on file    Relationship status: Not on file  Other Topics Concern  . Not on file  Social History Narrative  . Not on file    Additional Social History: ***  Allergies:   Allergies  Allergen Reactions  . Ativan [Lorazepam] Other (See Comments)    HYPERACTIVITY    Metabolic Disorder Labs: Lab Results  Component Value Date   HGBA1C (H) 03/11/2008    6.7 (NOTE)   The ADA recommends the following therapeutic goal for glycemic   control related to Hgb A1C measurement:   Goal of Therapy:   < 7.0% Hgb A1C   Reference: American Diabetes Association: Clinical Practice   Recommendations 2008, Diabetes Care,  2008, 31:(Suppl 1).   MPG 146 03/11/2008   MPG 154 03/10/2008   No results found for: PROLACTIN Lab Results  Component Value Date   CHOL 140 03/15/2018   TRIG 204 (H) 03/15/2018   HDL 33 (L) 03/15/2018   CHOLHDL 4.2 03/15/2018   VLDL 50 (H) 03/11/2008   LDLCALC 66 03/15/2018   LDLCALC (H) 03/11/2008    124        Total Cholesterol/HDL:CHD Risk Coronary Heart Disease Risk Table                     Men   Women  1/2 Average Risk   3.4   3.3  Average Risk       5.0   4.4  2 X Average Risk   9.6   7.1  3 X Average Risk  23.4   11.0        Use the calculated Patient Ratio above and the CHD Risk Table to determine the patient's CHD Risk.        ATP III CLASSIFICATION (LDL):  <100     mg/dL   Optimal  161-096100-129  mg/dL   Near or Above                    Optimal  130-159  mg/dL   Borderline  045-409160-189  mg/dL   High  >811>190     mg/dL   Very High   No results found for: TSH  Therapeutic Level Labs: No results found for: LITHIUM No results found for: CBMZ No results found for: VALPROATE  Current Medications: Current Outpatient Medications  Medication Sig Dispense Refill  . amLODipine (NORVASC) 5 MG tablet Take 5 mg by mouth daily.    Marland Kitchen. aspirin 81 MG tablet Take 81 mg by mouth daily.    Marland Kitchen. atorvastatin (LIPITOR) 40  MG tablet Take 40 mg by mouth daily.    . calcium gluconate 500 MG tablet Take 1 tablet by mouth 2 (two) times daily.     . citalopram (CELEXA) 40 MG tablet Take 20-40 mg by mouth See admin instructions. 40 mg in the morning, 20 mg at 2pm    . losartan (COZAAR) 50 MG tablet Take 50 mg by mouth daily.    . Magnesium Oxide 420 MG TABS Take 1 tablet by mouth at bedtime.     . metFORMIN (GLUCOPHAGE) 500 MG tablet Take 500 mg by mouth 2 (two) times daily with a meal.     . Methotrexate Sodium, PF, 50 MG/2ML SOLN 0.6 mLs once a week.   1  . Multiple Vitamin (ONE-A-DAY MENS PO) Take 1 tablet by mouth daily.     . Omega-3 Fatty Acids (FISH OIL) 1000 MG CAPS Take 1 capsule by mouth 2 (two) times daily.    . pantoprazole (PROTONIX) 40 MG tablet Take 1 tablet (40 mg total) by mouth 2 (two) times daily before a meal. 180 tablet 1  . Semaglutide (OZEMPIC, 0.25 OR 0.5 MG/DOSE, [place]) Inject 0.5 mg into the skin once a week. Fridays    . traZODone (DESYREL) 100 MG tablet Take 100 mg by mouth at bedtime.    . vitamin C (ASCORBIC ACID) 500 MG tablet Take 500 mg by mouth daily.     No current facility-administered medications for this visit.     Musculoskeletal: Strength & Muscle Tone: within normal limits Gait & Station: normal Patient leans: N/A  Psychiatric Specialty Exam: ROS  There were no vitals taken for this visit.There is no height or weight on file to calculate BMI.  General Appearance: Fairly Groomed  Eye Contact:  Good  Speech:  Clear and Coherent  Volume:  Normal  Mood:  {BHH MOOD:22306}  Affect:  {Affect (PAA):22687}  Thought Process:  Coherent  Orientation:  Full (Time, Place, and Person)  Thought Content:  Logical  Suicidal Thoughts:  {ST/HT (PAA):22692}  Homicidal Thoughts:  {ST/HT (PAA):22692}  Memory:  Immediate;   Good  Judgement:  {Judgement (PAA):22694}  Insight:  {Insight (PAA):22695}  Psychomotor Activity:  Normal  Concentration:  Concentration: Good and Attention Span:  Good  Recall:  Good  Fund of Knowledge:Good  Language: Good  Akathisia:  No  Handed:  Right  AIMS (if indicated):  not done  Assets:  Communication Skills Desire for Improvement  ADL's:  Intact  Cognition: WNL  Sleep:  {BHH GOOD/FAIR/POOR:22877}   Screenings:   Assessment and Plan:  Assessment  Plan  The patient demonstrates the following risk factors for suicide: Chronic risk factors for suicide include: {Chronic Risk Factors for YFVCBSW:96759163}. Acute risk factors for suicide include: {Acute Risk Factors for WGYKZLD:35701779}. Protective factors for this patient include: {Protective Factors for Suicide TJQZ:00923300}. Considering these factors, the overall suicide risk at this point appears to be {Desc; low/moderate/high:110033}. Patient {ACTION; IS/IS TMA:26333545} appropriate for outpatient follow up.    Neysa Hotter, MD 2/5/202012:12 PM

## 2018-04-04 ENCOUNTER — Other Ambulatory Visit (HOSPITAL_COMMUNITY): Payer: Self-pay | Admitting: Cardiology

## 2018-04-04 ENCOUNTER — Encounter (HOSPITAL_COMMUNITY)
Admission: RE | Admit: 2018-04-04 | Discharge: 2018-04-04 | Disposition: A | Discharge status: Home / Self Care | Payer: Medicare HMO | Source: Ambulatory Visit | Attending: Internal Medicine | Admitting: Internal Medicine

## 2018-04-04 ENCOUNTER — Ambulatory Visit
Admission: RE | Admit: 2018-04-04 | Discharge: 2018-04-04 | Disposition: A | Discharge status: Home / Self Care | Payer: Self-pay | Source: Ambulatory Visit | Attending: Cardiology | Admitting: Cardiology

## 2018-04-04 DIAGNOSIS — R69 Illness, unspecified: Secondary | ICD-10-CM

## 2018-04-05 NOTE — Progress Notes (Signed)
Cardiology Office Note    Date:  04/09/2018   ID:  Greg Alvarez, DOB [birth year], MRN [id number]  PCP:  Greg Lobo, MD  Cardiologist:  Greg Swaziland, MD   Chief Complaint  Patient presents with  . Coronary Artery Disease      History of Present Illness: Greg Alvarez is a 58 y.o. male who is seen for follow up CAD. He has a history of HTN, HLD, OSA and DM. He presented in 7/18 to [facility] for evaluation of an episode of dizziness, shortness of breath, chest tightness,and aphasia. His troponin was negative and a stress echocardiogram was ordered. His stress test was positive for inducible ischemia. EF was normal at rest. Patient had a left heart cath on 09/14/2016. Cath was significant for 75% prox ramus, 70% RPDA, 60% RPL and 75% mCx (small). Culprit lesion was a 90% prox/mid LAD lesion beginning at 1st septal and spans into mid segment. A 2.75 x 28 mm Promus stent was placed in the LAD and post dilated to 3.25 mm.  I personally reviewed cardiac cath films from [facility] on 09/14/16 and concur with the findings noted above.  He had a Myoview 03/15/18 and this showed normal perfusion and normal EF.   He does have a history of OSA. Was told in the past it was severe. He was on CPAP with improvement in his symptoms but states he hasn't been able to tolerate CPAP or nasal Bipap due to his panic disorder. 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   On follow up today he is doing well. No chest pain or dyspnea. He does walk when the weather is OK. He was started on Ozempic for his DM but is now having daily diarrhea and nausea. He has lost 6 lbs.      Past Medical History:  Diagnosis Date  . Coronary artery disease   . Diabetes mellitus type 2, noninsulin dependent (HCC)   . High cholesterol   . Hypertension    under control with med., has been on med. x 10 yr.  . 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 Panic  attacks   . Rheumatoid arthritis (HCC)   . Tenosynovitis of wrist 03/2013   right  . Wears glasses     Past Surgical History:  Procedure Laterality Date  . APPENDECTOMY  1980  . CARDIAC CATHETERIZATION  2002    states no problem - was done in [place]  . CARPAL TUNNEL RELEASE Left 07/29/2013   Procedure: LEFT CARPAL TUNNEL RELEASE;  Surgeon: Greg Reaper, MD;  Location: [facility];  Service: Orthopedics;  Laterality: Left;  . COLONOSCOPY N/A 01/12/2014   Procedure: COLONOSCOPY;  Surgeon: Greg Ade, MD;  [facility];  Service: Endoscopy;  Laterality: N/A;  9:30 AM  . SHOULDER ARTHROSCOPY W/ ROTATOR CUFF REPAIR  4/15   left-DSC  . TENDON REPAIR Right    hand  . TENOSYNOVECTOMY Right 04/08/2013   Procedure: TENOSYNOVECTOMY/FLEXOR TENDONS RIGHT WRIST;  Surgeon: Greg Reaper, MD;  Location: [facility];  Service: Orthopedics;  Laterality: Right;     Current Outpatient Medications  Medication Sig Dispense Refill  . amLODipine (NORVASC) 5 MG tablet Take 5 mg by mouth daily.    Marland Kitchen aspirin 81 MG tablet Take 81 mg by mouth daily.    Marland Kitchen atorvastatin (LIPITOR) 40 MG tablet Take 40 mg  by mouth daily.    . calcium gluconate 500 MG tablet Take 1 tablet by mouth 2 (two) times daily.     . citalopram (CELEXA) 40 MG tablet Take 20-40 mg by mouth See admin instructions. 40 mg in the morning, 20 mg at 2pm    . losartan (COZAAR) 50 MG tablet Take 50 mg by mouth daily.    . Magnesium Oxide 420 MG TABS Take 1 tablet by mouth at bedtime.     . metFORMIN (GLUCOPHAGE) 1000 MG tablet 1,000 mg 2 (two) times daily.    . Methotrexate Sodium, PF, 50 MG/2ML SOLN 0.6 mLs once a week.   1  . Multiple Vitamin (ONE-A-DAY MENS PO) Take 1 tablet by mouth daily.     . Omega-3 Fatty Acids (FISH OIL) 1000 MG CAPS Take 1 capsule by mouth 2 (two) times daily.    . pantoprazole (PROTONIX) 40 MG tablet Take 1 tablet (40 mg total) by mouth 2 (two) times daily before a meal. 180 tablet 1  .  Semaglutide (OZEMPIC, 0.25 OR 0.5 MG/DOSE, [place]) Inject 0.5 mg into the skin once a week. Fridays    . traZODone (DESYREL) 100 MG tablet Take 100 mg by mouth at bedtime.    . vitamin C (ASCORBIC ACID) 500 MG tablet Take 500 mg by mouth daily.     No current facility-administered medications for this visit.     Allergies:   Ativan [lorazepam]    Social History:  The patient  reports that he quit smoking about 20 years ago. His smoking use included cigarettes. He has never used smokeless tobacco. He reports current alcohol use. He reports that he does not use drugs.   Family History:  The patient's family history includes Anesthesia problems in his sister; Epilepsy in his mother; Heart attack in his brother and father; Heart disease in his brother and father; Heart failure in his father; Multiple sclerosis in his sister; Parkinson's disease in his father and sister; Stroke in his brother.    ROS:  Please see the history of present illness.   Otherwise, review of systems are positive for .   All other systems are reviewed and negative.    PHYSICAL EXAM: VS:  BP 128/82   Pulse 79   Ht 5\' 6"  (1.676 m)   Wt 249 lb 6.4 oz (113.1 kg)   BMI 40.25 kg/m  , BMI Body mass index is 40.25 kg/m. GENERAL:  Well appearing WM in NAD HEENT:  PERRL, EOMI, sclera are clear. Oropharynx is clear. NECK:  No jugular venous distention, carotid upstroke brisk and symmetric, no bruits, no thyromegaly or adenopathy LUNGS:  Clear to auscultation bilaterally CHEST:  Unremarkable HEART:  RRR,  PMI not displaced or sustained,S1 and S2 within normal limits, no S3, no S4: no clicks, no rubs, no murmurs ABD:  Soft, nontender. BS +, no masses or bruits. No hepatomegaly, no splenomegaly EXT:  2 + pulses throughout, no edema, no cyanosis no clubbing SKIN:  Warm and dry.  No rashes NEURO:  Alert and oriented x 3. Cranial nerves II through XII intact. PSYCH:  Cognitively intact  EKG:  EKG is not ordered  today.    Recent Labs: 03/15/2018: ALT 54; BUN 12; Creatinine, Ser 0.80; Potassium 4.3; Sodium 137    Lipid Panel    Component Value Date/Time   CHOL 140 03/15/2018 1223   TRIG 204 (H) 03/15/2018 1223   HDL 33 (L) 03/15/2018 1223   CHOLHDL 4.2 03/15/2018 1223  CHOLHDL 8.3 03/11/2008 0540   VLDL 50 (H) 03/11/2008 0540   LDLCALC 66 03/15/2018 1223  Labs dated 12/17/13: WBC 10.6. platelets 416K. Hgb 14.3. Cholesterol 295, triglycerides 281, HDL 35, LDL 204. Aic 6.4%. creatinine normal.     Labs dated 09/15/16: glucose 160. Other chemistries normal. Cholesterol 150, triglycerides 314, HDL 27, LDL 86. Hgb 13.5.   Labs from [place] one year ago: cholesterol 102, triglycerides 88, HDL 32, LDL 52.   Wt Readings from Last 3 Encounters:  04/09/18 249 lb 6.4 oz (113.1 kg)  03/18/18 255 lb 3.2 oz (115.8 kg)  03/15/18 253 lb (114.8 kg)      Other studies Reviewed: Additional studies/ records that were reviewed today include:   Myoview 03/15/18: Study Highlights    Nuclear stress EF: 56%. The left ventricular ejection fraction is normal (55-65%).  There was no ST segment deviation noted during stress.  This is a low risk study. No evidence of ischemia or previous infarction  The study is normal.      ASSESSMENT AND PLAN:  1.  CAD s/p NSTEMI in July 2018. Had DES of LAD. Moderate residual disease in multiple branches. EF normal. On ASA. He did experience chest pain in November related to stress. Follow up Myoview was normal.  2. DM type 2 not on insulin. Per primary care. Not tolerating Ozempic well due to GI side effects.  3. Hypercholesterolemia. On lipitor 40 mg daily and fish oil 2 grams daily. Last lipid panel was at goal with LDL 66. Triglycerides mildly elevated reflecting glycemic control. Continue lipitor.  4. HTN controlled. 5. Obesity with OSA. Intolerant of mask due to panic attacks- followed [facility]. He is going to check and see if there is another mask he can  try. 6. RA on methotrexate   Current medicines are reviewed at length with the patient today.  The patient does not have concerns regarding medicines.  The following changes have been made:  no change  Labs/ tests ordered today include:   No orders of the defined types were placed in this encounter.    Disposition:   FU with me in 6 months  Signed, Greg Swaziland, MD  04/09/2018 12:02 PM    [facility] [address], [place], [place], [address] Phone [phone], Fax [phone]

## 2018-04-08 ENCOUNTER — Ambulatory Visit (HOSPITAL_COMMUNITY): Payer: Self-pay | Admitting: Psychiatry

## 2018-04-09 ENCOUNTER — Encounter: Payer: Self-pay | Admitting: Cardiology

## 2018-04-09 ENCOUNTER — Ambulatory Visit: Payer: Medicare HMO | Admitting: Cardiology

## 2018-04-09 VITALS — BP 128/82 | HR 79 | Ht 66.0 in | Wt 249.4 lb

## 2018-04-09 DIAGNOSIS — E782 Mixed hyperlipidemia: Secondary | ICD-10-CM | POA: Diagnosis not present

## 2018-04-09 DIAGNOSIS — E119 Type 2 diabetes mellitus without complications: Secondary | ICD-10-CM

## 2018-04-09 DIAGNOSIS — I10 Essential (primary) hypertension: Secondary | ICD-10-CM | POA: Diagnosis not present

## 2018-04-09 DIAGNOSIS — I25118 Atherosclerotic heart disease of native coronary artery with other forms of angina pectoris: Secondary | ICD-10-CM

## 2018-04-11 ENCOUNTER — Ambulatory Visit (HOSPITAL_COMMUNITY)
Admission: RE | Admit: 2018-04-11 | Discharge: 2018-04-11 | Disposition: A | Discharge status: Home / Self Care | Payer: No Typology Code available for payment source | Attending: Internal Medicine | Admitting: Internal Medicine

## 2018-04-11 ENCOUNTER — Ambulatory Visit (HOSPITAL_COMMUNITY): Payer: No Typology Code available for payment source | Admitting: Anesthesiology

## 2018-04-11 ENCOUNTER — Encounter (HOSPITAL_COMMUNITY)
Admission: RE | Disposition: A | Discharge status: Home / Self Care | Payer: Self-pay | Source: Home / Self Care | Attending: Internal Medicine

## 2018-04-11 ENCOUNTER — Other Ambulatory Visit: Payer: Self-pay

## 2018-04-11 ENCOUNTER — Encounter (HOSPITAL_COMMUNITY): Payer: Self-pay

## 2018-04-11 ENCOUNTER — Telehealth: Payer: Self-pay | Admitting: General Practice

## 2018-04-11 DIAGNOSIS — Z7982 Long term (current) use of aspirin: Secondary | ICD-10-CM | POA: Insufficient documentation

## 2018-04-11 DIAGNOSIS — K449 Diaphragmatic hernia without obstruction or gangrene: Secondary | ICD-10-CM | POA: Insufficient documentation

## 2018-04-11 DIAGNOSIS — R110 Nausea: Secondary | ICD-10-CM | POA: Insufficient documentation

## 2018-04-11 DIAGNOSIS — E119 Type 2 diabetes mellitus without complications: Secondary | ICD-10-CM | POA: Diagnosis not present

## 2018-04-11 DIAGNOSIS — F410 Panic disorder [episodic paroxysmal anxiety] without agoraphobia: Secondary | ICD-10-CM | POA: Insufficient documentation

## 2018-04-11 DIAGNOSIS — Z955 Presence of coronary angioplasty implant and graft: Secondary | ICD-10-CM | POA: Diagnosis not present

## 2018-04-11 DIAGNOSIS — I2510 Atherosclerotic heart disease of native coronary artery without angina pectoris: Secondary | ICD-10-CM | POA: Insufficient documentation

## 2018-04-11 DIAGNOSIS — I10 Essential (primary) hypertension: Secondary | ICD-10-CM | POA: Diagnosis not present

## 2018-04-11 DIAGNOSIS — Z79899 Other long term (current) drug therapy: Secondary | ICD-10-CM | POA: Diagnosis not present

## 2018-04-11 DIAGNOSIS — Z87891 Personal history of nicotine dependence: Secondary | ICD-10-CM | POA: Diagnosis not present

## 2018-04-11 DIAGNOSIS — R12 Heartburn: Secondary | ICD-10-CM

## 2018-04-11 DIAGNOSIS — M069 Rheumatoid arthritis, unspecified: Secondary | ICD-10-CM | POA: Insufficient documentation

## 2018-04-11 DIAGNOSIS — Z7984 Long term (current) use of oral hypoglycemic drugs: Secondary | ICD-10-CM | POA: Diagnosis not present

## 2018-04-11 DIAGNOSIS — K2270 Barrett's esophagus without dysplasia: Secondary | ICD-10-CM | POA: Insufficient documentation

## 2018-04-11 DIAGNOSIS — K219 Gastro-esophageal reflux disease without esophagitis: Secondary | ICD-10-CM | POA: Insufficient documentation

## 2018-04-11 DIAGNOSIS — K222 Esophageal obstruction: Secondary | ICD-10-CM | POA: Diagnosis not present

## 2018-04-11 DIAGNOSIS — E7800 Pure hypercholesterolemia, unspecified: Secondary | ICD-10-CM | POA: Insufficient documentation

## 2018-04-11 HISTORY — PX: BIOPSY: SHX5522

## 2018-04-11 HISTORY — PX: ESOPHAGOGASTRODUODENOSCOPY (EGD) WITH PROPOFOL: SHX5813

## 2018-04-11 LAB — GLUCOSE, CAPILLARY: Glucose-Capillary: 134 mg/dL — ABNORMAL HIGH (ref 70–99)

## 2018-04-11 SURGERY — ESOPHAGOGASTRODUODENOSCOPY (EGD) WITH PROPOFOL
Anesthesia: Monitor Anesthesia Care

## 2018-04-11 MED ORDER — LACTATED RINGERS IV SOLN
INTRAVENOUS | Status: DC
  Administered 2018-04-11: 13:00:00 via INTRAVENOUS

## 2018-04-11 MED ORDER — MEPERIDINE HCL 100 MG/ML IJ SOLN: 6.2500 mg | INTRAMUSCULAR | Status: DC | PRN

## 2018-04-11 MED ORDER — KETAMINE HCL 50 MG/5ML IJ SOSY
PREFILLED_SYRINGE | INTRAMUSCULAR | Status: AC
  Filled 2018-04-11: qty 5

## 2018-04-11 MED ORDER — PROPOFOL 500 MG/50ML IV EMUL
INTRAVENOUS | Status: DC | PRN
  Administered 2018-04-11: 150 ug/kg/min via INTRAVENOUS

## 2018-04-11 MED ORDER — PROPOFOL 10 MG/ML IV BOLUS
INTRAVENOUS | Status: AC
  Filled 2018-04-11: qty 40

## 2018-04-11 MED ORDER — HYDROCODONE-ACETAMINOPHEN 7.5-325 MG PO TABS: 1.0000 | ORAL_TABLET | Freq: Once | ORAL | Status: DC | PRN

## 2018-04-11 MED ORDER — CHLORHEXIDINE GLUCONATE CLOTH 2 % EX PADS: 6.0000 | MEDICATED_PAD | Freq: Once | CUTANEOUS | Status: DC

## 2018-04-11 MED ORDER — PROPOFOL 10 MG/ML IV BOLUS
INTRAVENOUS | Status: DC | PRN
  Administered 2018-04-11: 20 mg via INTRAVENOUS

## 2018-04-11 MED ORDER — LACTATED RINGERS IV SOLN: INTRAVENOUS | Status: DC

## 2018-04-11 MED ORDER — KETAMINE HCL 10 MG/ML IJ SOLN
INTRAMUSCULAR | Status: DC | PRN
  Administered 2018-04-11: 15 mg via INTRAVENOUS

## 2018-04-11 MED ORDER — HYDROMORPHONE HCL 1 MG/ML IJ SOLN: 0.2500 mg | INTRAMUSCULAR | Status: DC | PRN

## 2018-04-11 MED ORDER — PROMETHAZINE HCL 25 MG/ML IJ SOLN: 6.2500 mg | INTRAMUSCULAR | Status: DC | PRN

## 2018-04-11 NOTE — Op Note (Signed)
[facility] Patient Name: Greg Alvarez Procedure Date: 04/11/2018 2:51 PM MRN: [id number] Date of Birth: [birth year] Attending MD: Gennette Pac , MD CSN: [id number] Age: 58 Admit Type: Outpatient Procedure:                Upper GI endoscopy Indications:              Heartburn, Nausea Providers:                Gennette Pac, MD, Criselda Peaches. Patsy Lager, RN,                            Edythe Clarity, Technician Referring MD:              Medicines:                Propofol per Anesthesia Complications:            No immediate complications. Estimated Blood Loss:     Estimated blood loss was minimal. Procedure:                Pre-Anesthesia Assessment:                           - Prior to the procedure, a History and Physical                            was performed, and patient medications and                            allergies were reviewed. The patient's tolerance of                            previous anesthesia was also reviewed. The risks                            and benefits of the procedure and the sedation                            options and risks were discussed with the patient.                            All questions were answered, and informed consent                            was obtained. Prior Anticoagulants: The patient has                            taken no previous anticoagulant or antiplatelet                            agents. ASA Grade Assessment: II - A patient with                            mild systemic disease. After reviewing the risks  and benefits, the patient was deemed in                            satisfactory condition to undergo the procedure.                           After obtaining informed consent, the endoscope was                            passed under direct vision. Throughout the                            procedure, the patient's blood pressure, pulse, and                            oxygen saturations  were monitored continuously. The                            [id number]-[id number] ([id number]) scope was introduced through the                            and advanced to the second part of duodenum. Scope In: 2:58:41 PM Scope Out: 3:03:43 PM Total Procedure Duration: 0 hours 5 minutes 2 seconds  Findings:      A mild Schatzki ring was found at the gastroesophageal junction. Single       skinny 2 cm tongue of salmon covered epithelium coming up from the       Schatzki's ring. No esophagitis. No tumor seen.      A small hiatal hernia was present. Otherwise, normal-appearing stomach.       Some retained gastric contents (last intake of solid food (21 hours       prior to this procedure)      The duodenal bulb and second portion of the duodenum were normal.       Abnormal esophagus biopsied. Impression:               - Mild Schatzki ring. Abnormal appearing distal                            esophageal mucosa. Query short segment Barrett's                            esophagus. Status post esophageal biopsy. Small                            hiatal hernia. Retained gastric contents. Suspect                            gastroparesis- Normal duodenal bulb and second                            portion of the duodenum.                           - Moderate Sedation:      Moderate (conscious) sedation was personally administered  by an       anesthesia professional. The following parameters were monitored: oxygen       saturation, heart rate, blood pressure, respiratory rate, EKG, adequacy       of pulmonary ventilation, and response to care. Recommendation:           - Patient has a contact number available for                            emergencies. The signs and symptoms of potential                            delayed complications were discussed with the                            patient. Return to normal activities tomorrow.                            Written discharge instructions were provided to the                             patient.                           - Advance diet as tolerated.                           - Continue present medications.                           - Await pathology results.                           -Solid phase gastric emptying study. Procedure Code(s):        --- Professional ---                           [phone], Esophagogastroduodenoscopy, flexible,                            transoral; diagnostic, including collection of                            specimen(s) by brushing or washing, when performed                            (separate procedure) Diagnosis Code(s):        --- Professional ---                           K22.2, Esophageal obstruction                           K44.9, Diaphragmatic hernia without obstruction or                            gangrene  R12, Heartburn                           R11.0, Nausea CPT copyright 2018 American Medical Association. All rights reserved. The codes documented in this report are preliminary and upon coder review may  be revised to meet current compliance requirements. Gerrit Friendsobert M. Azya Barbero, MD Gennette Pacobert Michael Charbel Los, MD 04/11/2018 3:17:33 PM This report has been signed electronically. Number of Addenda: 0

## 2018-04-11 NOTE — Addendum Note (Signed)
Addended by: Corrie Mckusick on: 04/11/2018 03:43 PM   Modules accepted: Orders

## 2018-04-11 NOTE — Discharge Instructions (Signed)
EGD Discharge instructions Please read the instructions outlined below and refer to this sheet in the next few weeks. These discharge instructions provide you with general information on caring for yourself after you leave the hospital. Your doctor may also give you specific instructions. While your treatment has been planned according to the most current medical practices available, unavoidable complications occasionally occur. If you have any problems or questions after discharge, please call your doctor. ACTIVITY  You may resume your regular activity but move at a slower pace for the next 24 hours.   Take frequent rest periods for the next 24 hours.   Walking will help expel (get rid of) the air and reduce the bloated feeling in your abdomen.   No driving for 24 hours (because of the anesthesia (medicine) used during the test).   You may shower.   Do not sign any important legal documents or operate any machinery for 24 hours (because of the anesthesia used during the test).  NUTRITION  Drink plenty of fluids.   You may resume your normal diet.   Begin with a light meal and progress to your normal diet.   Avoid alcoholic beverages for 24 hours or as instructed by your caregiver.  MEDICATIONS  You may resume your normal medications unless your caregiver tells you otherwise.  WHAT YOU CAN EXPECT TODAY  You may experience abdominal discomfort such as a feeling of fullness or gas pains.  FOLLOW-UP  Your doctor will discuss the results of your test with you.  SEEK IMMEDIATE MEDICAL ATTENTION IF ANY OF THE FOLLOWING OCCUR:  Excessive nausea (feeling sick to your stomach) and/or vomiting.   Severe abdominal pain and distention (swelling).   Trouble swallowing.   Temperature over 101 F (37.8 C).   Rectal bleeding or vomiting of blood.    GERD information provided  Continue Protonix 40 mg twice daily  Solid-phase gastric emptying to further evaluate poorly emptying  stomach   ****Dr. Luvenia Starch officed notified and will call patient*****  Office visit with [place] in 3 months  Further recommendations to follow pending review of pathology report     Gastroesophageal Reflux Disease, Adult Gastroesophageal reflux (GER) happens when acid from the stomach flows up into the tube that connects the mouth and the stomach (esophagus). Normally, food travels down the esophagus and stays in the stomach to be digested. With GER, food and stomach acid sometimes move back up into the esophagus. You may have a disease called gastroesophageal reflux disease (GERD) if the reflux:  Happens often.  Causes frequent or very bad symptoms.  Causes problems such as damage to the esophagus. When this happens, the esophagus becomes sore and swollen (inflamed). Over time, GERD can make small holes (ulcers) in the lining of the esophagus. What are the causes? This condition is caused by a problem with the muscle between the esophagus and the stomach. When this muscle is weak or not normal, it does not close properly to keep food and acid from coming back up from the stomach. The muscle can be weak because of:  Tobacco use.  Pregnancy.  Having a certain type of hernia (hiatal hernia).  Alcohol use.  Certain foods and drinks, such as coffee, chocolate, onions, and peppermint. What increases the risk? You are more likely to develop this condition if you:  Are overweight.  Have a disease that affects your connective tissue.  Use NSAID medicines. What are the signs or symptoms? Symptoms of this condition include:  Heartburn.  Difficult or painful swallowing.  The feeling of having a lump in the throat.  A bitter taste in the mouth.  Bad breath.  Having a lot of saliva.  Having an upset or bloated stomach.  Belching.  Chest pain. Different conditions can cause chest pain. Make sure you see your doctor if you have chest pain.  Shortness of breath or noisy  breathing (wheezing).  Ongoing (chronic) cough or a cough at night.  Wearing away of the surface of teeth (tooth enamel).  Weight loss. How is this treated? Treatment will depend on how bad your symptoms are. Your doctor may suggest:  Changes to your diet.  Medicine.  Surgery. Follow these instructions at home: Eating and drinking   Follow a diet as told by your doctor. You may need to avoid foods and drinks such as: ? Coffee and tea (with or without caffeine). ? Drinks that contain alcohol. ? Energy drinks and sports drinks. ? Bubbly (carbonated) drinks or sodas. ? Chocolate and cocoa. ? Peppermint and mint flavorings. ? Garlic and onions. ? Horseradish. ? Spicy and acidic foods. These include peppers, chili powder, curry powder, vinegar, hot sauces, and BBQ sauce. ? Citrus fruit juices and citrus fruits, such as oranges, lemons, and limes. ? Tomato-based foods. These include red sauce, chili, salsa, and pizza with red sauce. ? Fried and fatty foods. These include donuts, french fries, potato chips, and high-fat dressings. ? High-fat meats. These include hot dogs, rib eye steak, sausage, ham, and bacon. ? High-fat dairy items, such as whole milk, butter, and cream cheese.  Eat small meals often. Avoid eating large meals.  Avoid drinking large amounts of liquid with your meals.  Avoid eating meals during the 2-3 hours before bedtime.  Avoid lying down right after you eat.  Do not exercise right after you eat. Lifestyle   Do not use any products that contain nicotine or tobacco. These include cigarettes, e-cigarettes, and chewing tobacco. If you need help quitting, ask your doctor.  Try to lower your stress. If you need help doing this, ask your doctor.  If you are overweight, lose an amount of weight that is healthy for you. Ask your doctor about a safe weight loss goal. General instructions  Pay attention to any changes in your symptoms.  Take over-the-counter  and prescription medicines only as told by your doctor. Do not take aspirin, ibuprofen, or other NSAIDs unless your doctor says it is okay.  Wear loose clothes. Do not wear anything tight around your waist.  Raise (elevate) the head of your bed about 6 inches (15 cm).  Avoid bending over if this makes your symptoms worse.  Keep all follow-up visits as told by your doctor. This is important. Contact a doctor if:  You have new symptoms.  You lose weight and you do not know why.  You have trouble swallowing or it hurts to swallow.  You have wheezing or a cough that keeps happening.  Your symptoms do not get better with treatment.  You have a hoarse voice. Get help right away if:  You have pain in your arms, neck, jaw, teeth, or back.  You feel sweaty, dizzy, or light-headed.  You have chest pain or shortness of breath.  You throw up (vomit) and your throw-up looks like blood or coffee grounds.  You pass out (faint).  Your poop (stool) is bloody or black.  You cannot swallow, drink, or eat. Summary  If a person has gastroesophageal reflux disease (GERD),  food and stomach acid move back up into the esophagus and cause symptoms or problems such as damage to the esophagus.  Treatment will depend on how bad your symptoms are.  Follow a diet as told by your doctor.  Take all medicines only as told by your doctor. This information is not intended to replace advice given to you by your health care provider. Make sure you discuss any questions you have with your health care provider. Document Released: 08/02/2007 Document Revised: 08/22/2017 Document Reviewed: 08/22/2017 Elsevier Interactive Patient Education  2019 [employer]     Monitored Anesthesia Care, Care After These instructions provide you with information about caring for yourself after your procedure. Your health care provider may also give you more specific instructions. Your treatment has been planned  according to current medical practices, but problems sometimes occur. Call your health care provider if you have any problems or questions after your procedure. What can I expect after the procedure? After your procedure, you may:  Feel sleepy for several hours.  Feel clumsy and have poor balance for several hours.  Feel forgetful about what happened after the procedure.  Have poor judgment for several hours.  Feel nauseous or vomit.  Have a sore throat if you had a breathing tube during the procedure. Follow these instructions at home: For at least 24 hours after the procedure:      Have a responsible adult stay with you. It is important to have someone help care for you until you are awake and alert.  Rest as needed.  Do not: ? Participate in activities in which you could fall or become injured. ? Drive. ? Use heavy machinery. ? Drink alcohol. ? Take sleeping pills or medicines that cause drowsiness. ? Make important decisions or sign legal documents. ? Take care of children on your own. Eating and drinking  Follow the diet that is recommended by your health care provider.  If you vomit, drink water, juice, or soup when you can drink without vomiting.  Make sure you have little or no nausea before eating solid foods. General instructions  Take over-the-counter and prescription medicines only as told by your health care provider.  If you have sleep apnea, surgery and certain medicines can increase your risk for breathing problems. Follow instructions from your health care provider about wearing your sleep device: ? Anytime you are sleeping, including during daytime naps. ? While taking prescription pain medicines, sleeping medicines, or medicines that make you drowsy.  If you smoke, do not smoke without supervision.  Keep all follow-up visits as told by your health care provider. This is important. Contact a health care provider if:  You keep feeling nauseous or  you keep vomiting.  You feel light-headed.  You develop a rash.  You have a fever. Get help right away if:  You have trouble breathing. Summary  For several hours after your procedure, you may feel sleepy and have poor judgment.  Have a responsible adult stay with you for at least 24 hours or until you are awake and alert. This information is not intended to replace advice given to you by your health care provider. Make sure you discuss any questions you have with your health care provider. Document Released: 06/06/2015 Document Revised: 09/29/2016 Document Reviewed: 06/06/2015 Elsevier Interactive Patient Education  2019 [employer]

## 2018-04-11 NOTE — Anesthesia Postprocedure Evaluation (Signed)
Anesthesia Post Note  Patient: Greg Alvarez  Procedure(s) Performed: ESOPHAGOGASTRODUODENOSCOPY (EGD) WITH PROPOFOL (N/A ) BIOPSY  Patient location during evaluation: PACU Anesthesia Type: MAC Level of consciousness: awake and alert Pain management: pain level controlled Vital Signs Assessment: post-procedure vital signs reviewed and stable Respiratory status: spontaneous breathing Cardiovascular status: blood pressure returned to baseline and stable Postop Assessment: no apparent nausea or vomiting Anesthetic complications: no     Last Vitals:  Vitals:   04/11/18 1232 04/11/18 1515  BP: 117/77 114/87  Pulse:  67  Resp: 11 (!) 22  Temp:  (P) 36.8 C  SpO2: 94% 93%    Last Pain:  Vitals:   04/11/18 1452  TempSrc:   PainSc: 0-No pain                 Doreene Forrey

## 2018-04-11 NOTE — Telephone Encounter (Signed)
Per Dr. Jena Gauss please schedule GES for poorly emptying stomach  Routing to the clinical pool

## 2018-04-11 NOTE — Anesthesia Preprocedure Evaluation (Signed)
Anesthesia Evaluation    Airway Mallampati: III       Dental  (+) Teeth Intact   Pulmonary former smoker,    breath sounds clear to auscultation       Cardiovascular hypertension, On Medications + angina + CAD   Rhythm:regular     Neuro/Psych Anxiety    GI/Hepatic GERD  ,  Endo/Other  diabetes  Renal/GU      Musculoskeletal   Abdominal   Peds  Hematology   Anesthesia Other Findings H/O CAD, anginal episodes relieved with cardiac stents  Reproductive/Obstetrics                             Anesthesia Physical Anesthesia Plan  ASA: III  Anesthesia Plan: MAC   Post-op Pain Management:    Induction:   PONV Risk Score and Plan:   Airway Management Planned:   Additional Equipment:   Intra-op Plan:   Post-operative Plan:   Informed Consent: I have reviewed the patients History and Physical, chart, labs and discussed the procedure including the risks, benefits and alternatives for the proposed anesthesia with the patient or authorized representative who has indicated his/her understanding and acceptance.     Dental Advisory Given  Plan Discussed with: Anesthesiologist  Anesthesia Plan Comments:         Anesthesia Quick Evaluation

## 2018-04-11 NOTE — Interval H&P Note (Signed)
History and Physical Interval Note:  04/11/2018 2:41 PM  Greg Alvarez  has presented today for surgery, with the diagnosis of GERD, nausea  The various methods of treatment have been discussed with the patient and family. After consideration of risks, benefits and other options for treatment, the patient has consented to  Procedure(s) with comments: ESOPHAGOGASTRODUODENOSCOPY (EGD) WITH PROPOFOL (N/A) - 1:15pm as a surgical intervention .  The patient's history has been reviewed, patient examined, no change in status, stable for surgery.  I have reviewed the patient's chart and labs.  Questions were answered to the patient's satisfaction.     Eula Listen   GERD better.  Nausea persists but no vomiting.  No dysphagia.  Diagnostic EGD today per plan.  The risks, benefits, limitations, alternatives and imponderables have been reviewed with the patient. Potential for esophageal dilation, biopsy, etc. have also been reviewed.  Questions have been answered. All parties agreeable.

## 2018-04-11 NOTE — Transfer of Care (Signed)
Immediate Anesthesia Transfer of Care Note  Patient: Greg Alvarez  Procedure(s) Performed: ESOPHAGOGASTRODUODENOSCOPY (EGD) WITH PROPOFOL (N/A ) BIOPSY  Patient Location: PACU  Anesthesia Type:MAC  Level of Consciousness: awake and alert   Airway & Oxygen Therapy: Patient Spontanous Breathing  Post-op Assessment: Report given to RN  Post vital signs: Reviewed and stable  Last Vitals:  Vitals Value Taken Time  BP 114/87 04/11/2018  3:12 PM  Temp    Pulse 67 04/11/2018  3:16 PM  Resp 21 04/11/2018  3:16 PM  SpO2 92 % 04/11/2018  3:16 PM  Vitals shown include unvalidated device data.  Last Pain:  Vitals:   04/11/18 1452  TempSrc:   PainSc: 0-No pain      Patients Stated Pain Goal: 7 (63/33/54 [id number])  Complications: No apparent anesthesia complications

## 2018-04-12 NOTE — Telephone Encounter (Signed)
GES scheduled for 04/18/18. Called to inform pt but he will be out of town and requested GES be done 04/29/18.  GES rescheduled to 04/29/18 at 8:00am, arrive at 7:45am. NPO after midnight prior to test and no stomach medication the morning of test. Called pt back and informed him of appt. Letter mailed.  No PA needed for GES per [employer].

## 2018-04-16 ENCOUNTER — Encounter (HOSPITAL_COMMUNITY): Payer: Self-pay | Admitting: Internal Medicine

## 2018-04-18 ENCOUNTER — Other Ambulatory Visit (HOSPITAL_COMMUNITY): Payer: Self-pay

## 2018-04-20 ENCOUNTER — Encounter: Payer: Self-pay | Admitting: Internal Medicine

## 2018-04-29 ENCOUNTER — Ambulatory Visit (HOSPITAL_COMMUNITY)
Admission: RE | Admit: 2018-04-29 | Discharge: 2018-04-29 | Disposition: A | Discharge status: Home / Self Care | Payer: No Typology Code available for payment source | Source: Ambulatory Visit | Attending: Internal Medicine | Admitting: Internal Medicine

## 2018-04-29 ENCOUNTER — Encounter (HOSPITAL_COMMUNITY): Payer: Self-pay

## 2018-04-29 DIAGNOSIS — R110 Nausea: Secondary | ICD-10-CM | POA: Insufficient documentation

## 2018-04-29 MED ORDER — TECHNETIUM TC 99M SULFUR COLLOID
2.0000 | Freq: Once | INTRAVENOUS | Status: AC | PRN
  Administered 2018-04-29: 2.13 via ORAL

## 2018-06-19 ENCOUNTER — Other Ambulatory Visit: Payer: Self-pay

## 2018-06-19 ENCOUNTER — Ambulatory Visit: Payer: No Typology Code available for payment source | Admitting: Nurse Practitioner

## 2018-06-19 NOTE — Progress Notes (Signed)
Cancelled by patient

## 2019-02-05 ENCOUNTER — Other Ambulatory Visit: Payer: Self-pay

## 2019-02-05 DIAGNOSIS — M25562 Pain in left knee: Secondary | ICD-10-CM

## 2019-02-07 ENCOUNTER — Other Ambulatory Visit: Payer: Self-pay | Admitting: Family

## 2019-02-07 DIAGNOSIS — M25562 Pain in left knee: Secondary | ICD-10-CM

## 2019-02-22 ENCOUNTER — Ambulatory Visit
Admission: RE | Admit: 2019-02-22 | Discharge: 2019-02-22 | Disposition: A | Discharge status: Home / Self Care | Payer: Medicare HMO | Source: Ambulatory Visit | Attending: Family | Admitting: Family

## 2019-02-22 ENCOUNTER — Other Ambulatory Visit: Payer: Self-pay

## 2019-02-22 DIAGNOSIS — M25562 Pain in left knee: Secondary | ICD-10-CM

## 2019-03-26 ENCOUNTER — Other Ambulatory Visit: Payer: Self-pay

## 2019-03-26 ENCOUNTER — Ambulatory Visit (INDEPENDENT_AMBULATORY_CARE_PROVIDER_SITE_OTHER): Payer: Medicare HMO | Admitting: Orthopaedic Surgery

## 2019-03-26 DIAGNOSIS — G8929 Other chronic pain: Secondary | ICD-10-CM | POA: Diagnosis not present

## 2019-03-26 DIAGNOSIS — M25562 Pain in left knee: Secondary | ICD-10-CM

## 2019-03-26 MED ORDER — METHYLPREDNISOLONE ACETATE 40 MG/ML IJ SUSP
40.0000 mg | INTRAMUSCULAR | Status: AC | PRN
  Administered 2019-03-26: 40 mg via INTRA_ARTICULAR

## 2019-03-26 MED ORDER — BUPIVACAINE HCL 0.5 % IJ SOLN
2.0000 mL | INTRAMUSCULAR | Status: AC | PRN
  Administered 2019-03-26: 2 mL via INTRA_ARTICULAR

## 2019-03-26 MED ORDER — LIDOCAINE HCL 1 % IJ SOLN
2.0000 mL | INTRAMUSCULAR | Status: AC | PRN
  Administered 2019-03-26: 2 mL

## 2019-03-26 NOTE — Progress Notes (Signed)
Office Visit Note   Patient: Greg Alvarez           Date of Birth: 1961/02/27           MRN: [id number] Visit Date: 03/26/2019              Requested by: No referring provider defined for this encounter. PCP: Tsosie Billing, MD (Inactive)   Assessment & Plan: Visit Diagnoses:  1. Chronic pain of left knee     Plan: Impression is chronic left knee pain.  MRI of the left knee was reviewed which shows no structural abnormalities of other than degenerative changes in insertion of the patellar tendon to the tibial tubercle.  There is some inflammation in the fat pad and edema in the superficial tissues.  He is not tender in those areas.  Overall his pain seems to be fairly vague.  Based on our discussion he has elected to try cortisone injection and outpatient physical therapy.  Patient instructed to follow-up with [place] if he does not notice any improvement after completing physical therapy.  Follow-Up Instructions: Return if symptoms worsen or fail to improve.   Orders:  Orders Placed This Encounter  Procedures  . Ambulatory referral to Physical Therapy   No orders of the defined types were placed in this encounter.     Procedures: Large Joint Inj: L knee on 03/26/2019 1:39 PM Details: 22 G needle Medications: 2 mL bupivacaine 0.5 %; 2 mL lidocaine 1 %; 40 mg methylPREDNISolone acetate 40 MG/ML Outcome: tolerated well, no immediate complications Patient was prepped and draped in the usual sterile fashion.       Clinical Data: No additional findings.   Subjective: Chief Complaint  Patient presents with  . Left Knee - Pain    Greg Alvarez is a 59 year old gentleman who comes in for evaluation of chronic left knee pain for 5 to 6 years.  Denies any definite injuries.  He feels like the pain is getting worse.  He does notice that it swells at the end of the day.  He denies any.  Walking and activity definitely make it worse.  He has not started on any physical therapy or  conservative treatment.   Review of Systems  Constitutional: Negative.   All other systems reviewed and are negative.    Objective: Vital Signs: There were no vitals taken for this visit.  Physical Exam Vitals and nursing note reviewed.  Constitutional:      Appearance: He is well-developed.  HENT:     Head: Normocephalic and atraumatic.  Eyes:     Pupils: Pupils are equal, round, and reactive to light.  Pulmonary:     Effort: Pulmonary effort is normal.  Abdominal:     Palpations: Abdomen is soft.  Musculoskeletal:        General: Normal range of motion.     Cervical back: Neck supple.  Skin:    General: Skin is warm.  Neurological:     Mental Status: He is alert and oriented to person, place, and time.  Psychiatric:        Behavior: Behavior normal.        Thought Content: Thought content normal.        Judgment: Judgment normal.     Ortho Exam Left knee exam shows no joint effusion.  There is no tenderness to palpation over the tibial tubercle or the patellar tendon.  Collaterals and cruciates are stable.  Normal range of motion. Specialty Comments:  No specialty comments available.  Imaging: No results found.   PMFS History: Patient Active Problem List   Diagnosis Date Noted  . GERD (gastroesophageal reflux disease) 03/18/2018  . Nausea without vomiting 03/18/2018  . Unstable angina (HCC) 09/15/2016  . Screening for colorectal cancer    Past Medical History:  Diagnosis Date  . Coronary artery disease   . Diabetes mellitus type 2, noninsulin dependent (HCC)   . High cholesterol   . Hypertension    under control with med., has been on med. x 10 yr.  . 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 Panic attacks   . Rheumatoid arthritis (HCC)   . Tenosynovitis of wrist 03/2013   right  . Wears glasses     Family History  Problem Relation Age of Onset  . Anesthesia problems Sister        hard to wake  up post-op  . Parkinson's disease Sister   . Multiple sclerosis Sister   . Epilepsy Mother   . Heart disease Father   . Parkinson's disease Father   . Heart failure Father   . Heart attack Father   . Heart disease Brother   . Heart attack Brother   . Stroke Brother   . Colon cancer Neg Hx   . Gastric cancer Neg Hx   . Esophageal cancer Neg Hx     Past Surgical History:  Procedure Laterality Date  . APPENDECTOMY  1980  . BIOPSY  04/11/2018   Procedure: BIOPSY;  Surgeon: Corbin Ade, MD;  Location: [facility];  Service: Endoscopy;;  esophagus  . CARDIAC CATHETERIZATION  2002    states no problem - was done in [place]  . CARPAL TUNNEL RELEASE Left 07/29/2013   Procedure: LEFT CARPAL TUNNEL RELEASE;  Surgeon: Nicki Reaper, MD;  Location: [facility];  Service: Orthopedics;  Laterality: Left;  . COLONOSCOPY N/A 01/12/2014   Procedure: COLONOSCOPY;  Surgeon: Corbin Ade, MD;  Location: [facility];  Service: Endoscopy;  Laterality: N/A;  9:30 AM  . ESOPHAGOGASTRODUODENOSCOPY (EGD) WITH PROPOFOL N/A 04/11/2018   Procedure: ESOPHAGOGASTRODUODENOSCOPY (EGD) WITH PROPOFOL;  Surgeon: Corbin Ade, MD;  Location: [facility];  Service: Endoscopy;  Laterality: N/A;  1:15pm  . SHOULDER ARTHROSCOPY W/ ROTATOR CUFF REPAIR  4/15   left-DSC  . TENDON REPAIR Right    hand  . TENOSYNOVECTOMY Right 04/08/2013   Procedure: TENOSYNOVECTOMY/FLEXOR TENDONS RIGHT WRIST;  Surgeon: Nicki Reaper, MD;  Location: [facility];  Service: Orthopedics;  Laterality: Right;   Social History   Occupational History  . Not on file  Tobacco Use  . Smoking status: Former Smoker    Types: Cigarettes    Quit date: 03/13/1998    Years since quitting: 21.0  . Smokeless tobacco: Never Used  Substance and Sexual Activity  . Alcohol use: Yes    Comment: 1-2 x/month  . Drug use: No  . Sexual activity: Not on file

## 2019-08-26 ENCOUNTER — Ambulatory Visit (INDEPENDENT_AMBULATORY_CARE_PROVIDER_SITE_OTHER)
Admission: EM | Admit: 2019-08-26 | Discharge: 2019-08-26 | Disposition: A | Discharge status: Home / Self Care | Payer: Medicare HMO | Source: Home / Self Care

## 2019-08-26 ENCOUNTER — Encounter (HOSPITAL_COMMUNITY): Payer: Self-pay | Admitting: Emergency Medicine

## 2019-08-26 ENCOUNTER — Other Ambulatory Visit: Payer: Self-pay

## 2019-08-26 ENCOUNTER — Emergency Department (HOSPITAL_COMMUNITY)
Admission: EM | Admit: 2019-08-26 | Discharge: 2019-08-26 | Disposition: A | Discharge status: Home / Self Care | Payer: Medicare HMO | Attending: Emergency Medicine | Admitting: Emergency Medicine

## 2019-08-26 DIAGNOSIS — R1030 Lower abdominal pain, unspecified: Secondary | ICD-10-CM | POA: Diagnosis not present

## 2019-08-26 DIAGNOSIS — R197 Diarrhea, unspecified: Secondary | ICD-10-CM | POA: Diagnosis present

## 2019-08-26 DIAGNOSIS — R110 Nausea: Secondary | ICD-10-CM | POA: Diagnosis not present

## 2019-08-26 DIAGNOSIS — Z5321 Procedure and treatment not carried out due to patient leaving prior to being seen by health care provider: Secondary | ICD-10-CM | POA: Diagnosis not present

## 2019-08-26 DIAGNOSIS — M791 Myalgia, unspecified site: Secondary | ICD-10-CM | POA: Insufficient documentation

## 2019-08-26 NOTE — ED Triage Notes (Signed)
Pt sent from urgent care for diarrhea x 3 days with nausea and abd cramping across lower abd, muscle aches

## 2019-08-26 NOTE — ED Triage Notes (Signed)
Pt states he has had diarrhea since Saturday.  Is having at least 10 bowel movements per day.  Pt reports having body cramps.

## 2019-08-26 NOTE — ED Notes (Signed)
Patient is being discharged from the Urgent Care and sent to the Emergency Department via POV . Per K. Avegno, patient is in need of higher level of care due to poss dehydration. Patient is aware and verbalizes understanding of plan of care.  Vitals:   08/26/19 1223  BP: 121/81  Pulse: 80  Resp: 18  Temp: 98.2 F (36.8 C)  SpO2: 97%

## 2019-11-21 ENCOUNTER — Other Ambulatory Visit: Payer: Self-pay

## 2019-11-21 ENCOUNTER — Other Ambulatory Visit: Payer: Self-pay | Admitting: Internal Medicine

## 2019-11-21 ENCOUNTER — Encounter: Payer: Self-pay | Admitting: Internal Medicine

## 2019-11-21 ENCOUNTER — Ambulatory Visit: Payer: Medicare HMO | Admitting: Internal Medicine

## 2019-11-21 VITALS — BP 132/81 | HR 66 | Temp 97.6°F | Ht 67.0 in | Wt 231.6 lb

## 2019-11-21 DIAGNOSIS — K219 Gastro-esophageal reflux disease without esophagitis: Secondary | ICD-10-CM | POA: Diagnosis not present

## 2019-11-21 DIAGNOSIS — R197 Diarrhea, unspecified: Secondary | ICD-10-CM

## 2019-11-21 MED ORDER — DICYCLOMINE HCL 10 MG PO CAPS: 10.0000 mg | ORAL_CAPSULE | Freq: Three times a day (TID) | ORAL | 1 refills | Status: DC

## 2019-11-21 NOTE — Progress Notes (Signed)
Primary Care Physician:  Raymon Mutton., FNP Primary Gastroenterologist:  Dr. Jena Gauss  Pre-Procedure History & Physical: HPI:  Greg Alvarez is a 59 y.o. male here for further evaluation of acute onset nonbloody, incessant diarrhea of 3 months duration.  Patient states he was in his usual state of GI health with his reflux symptoms well controlled on Protonix 40 mg twice daily.  Up until 3 months ago,  bowel function was described as 1-2 formed bowel movements daily.  He states 3 months ago, he went to the dentist and and was found to have had a dental infection  -  was given a course of antibiotics.  Right around this time, he started having diarrhea.  Has upwards of 5 nonbloody, watery stools throughout the day;   may get up in the middle the night a couple times with the urge to have a bowel movement.  He is not passed any blood.  No sick contacts;  no unusual travel.  PCP suggested a drug holiday with Glucophage.  He stopped it for a week.  Made no difference in his symptoms.  Has been taking Glucophage for years.  Patient is taking Imodium and Pepto-Bismol without any improvement in symptoms.  Ileocolonoscopy normal 2015-average risk screening  GERD with again well-controlled on Protonix 40 mg twice daily.  Prior GES showed: Slow gastric emptying.  Prior EGD revealed retained gastric contents.  Past Medical History:  Diagnosis Date  . Coronary artery disease   . Diabetes mellitus type 2, noninsulin dependent (HCC)   . High cholesterol   . Hypertension    under control with med., has been on med. x 10 yr.  . 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 Panic attacks   . Rheumatoid arthritis (HCC)   . Tenosynovitis of wrist 03/2013   right  . Wears glasses     Past Surgical History:  Procedure Laterality Date  . APPENDECTOMY  1980  . BIOPSY  04/11/2018   Procedure: BIOPSY;  Surgeon: Corbin Ade, MD;  [facility];  Service: Endoscopy;;  esophagus  . CARDIAC CATHETERIZATION  2002    states no problem - was done in [place]  . CARPAL TUNNEL RELEASE Left 07/29/2013   Procedure: LEFT CARPAL TUNNEL RELEASE;  Surgeon: Nicki Reaper, MD;  Location: [facility];  Service: Orthopedics;  Laterality: Left;  . COLONOSCOPY N/A 01/12/2014   Procedure: COLONOSCOPY;  Surgeon: Corbin Ade, MD;  [facility];  Service: Endoscopy;  Laterality: N/A;  9:30 AM  . ESOPHAGOGASTRODUODENOSCOPY (EGD) WITH PROPOFOL N/A 04/11/2018   Procedure: ESOPHAGOGASTRODUODENOSCOPY (EGD) WITH PROPOFOL;  Surgeon: Corbin Ade, MD;  [facility];  Service: Endoscopy;  Laterality: N/A;  1:15pm  . SHOULDER ARTHROSCOPY W/ ROTATOR CUFF REPAIR  4/15   left-DSC  . TENDON REPAIR Right    hand  . TENOSYNOVECTOMY Right 04/08/2013   Procedure: TENOSYNOVECTOMY/FLEXOR TENDONS RIGHT WRIST;  Surgeon: Nicki Reaper, MD;  Location: [facility];  Service: Orthopedics;  Laterality: Right;    Prior to Admission medications   Medication Sig Start Date End Date Taking? Authorizing Provider  amLODipine (NORVASC) 5 MG tablet Take 5 mg by mouth daily.   Yes [provider]  aspirin 81 MG tablet Take 81 mg by mouth daily.   Yes [provider]  atorvastatin (LIPITOR) 40 MG tablet Take 40 mg by  mouth daily.   Yes [provider]  citalopram (CELEXA) 40 MG tablet Take 20-40 mg by mouth See admin instructions. 40 mg in the morning, 20 mg at 2pm   Yes [provider]  losartan (COZAAR) 50 MG tablet Take 50 mg by mouth daily.   Yes [provider]  metFORMIN (GLUCOPHAGE) 1000 MG tablet 1,000 mg 2 (two) times daily. 04/05/18  Yes [provider]  Multiple Vitamin (ONE-A-DAY MENS PO) Take 1 tablet by mouth daily.    Yes [provider]  Omega-3 Fatty Acids (FISH OIL) 1000 MG CAPS Take 1 capsule by mouth 2 (two) times daily.   Yes [provider]    pantoprazole (PROTONIX) 40 MG tablet Take 1 tablet (40 mg total) by mouth 2 (two) times daily before a meal. 03/18/18  Yes Gill, Eric A, NP  Semaglutide (OZEMPIC, 0.25 OR 0.5 MG/DOSE, [place]) Inject 0.5 mg into the skin once a week. Fridays   Yes [provider]  traZODone (DESYREL) 100 MG tablet Take 100 mg by mouth at bedtime.   Yes [provider]  calcium gluconate 500 MG tablet Take 1 tablet by mouth 2 (two) times daily.  Patient not taking: Reported on 11/21/2019    [provider]  Magnesium Oxide 420 MG TABS Take 1 tablet by mouth at bedtime.  Patient not taking: Reported on 11/21/2019    [provider]  Methotrexate Sodium, PF, 50 MG/2ML SOLN 0.6 mLs once a week.  Patient not taking: Reported on 11/21/2019 12/10/13   [provider]  vitamin C (ASCORBIC ACID) 500 MG tablet Take 500 mg by mouth daily. Patient not taking: Reported on 11/21/2019    [provider]    Allergies as of 11/21/2019 - Review Complete 11/21/2019  Allergen Reaction Noted  . Ativan [lorazepam] Other (See Comments) 04/04/2013    Family History  Problem Relation Age of Onset  . Anesthesia problems Sister        hard to wake up post-op  . Parkinson's disease Sister   . Multiple sclerosis Sister   . Epilepsy Mother   . Heart disease Father   . Parkinson's disease Father   . Heart failure Father   . Heart attack Father   . Heart disease Brother   . Heart attack Brother   . Stroke Brother   . Colon cancer Neg Hx   . Gastric cancer Neg Hx   . Esophageal cancer Neg Hx     Social History   Socioeconomic History  . Marital status: Divorced    Spouse name: Not on file  . Number of children: Not on file  . Years of education: Not on file  . Highest education level: Not on file  Occupational History  . Not on file  Tobacco Use  . Smoking status: Former Smoker    Types: Cigarettes    Quit date: 03/13/1998    Years since quitting: 21.7  . Smokeless  tobacco: Never Used  Vaping Use  . Vaping Use: Never used  Substance and Sexual Activity  . Alcohol use: Not Currently    Comment: hasn't drank in 4 years/1-2 x/month  . Drug use: No  . Sexual activity: Not on file  Other Topics Concern  . Not on file  Social History Narrative  . Not on file   Social Determinants of Health   Financial Resource Strain:   . Difficulty of Paying Living Expenses: Not on file  Food Insecurity:   .  Worried About Programme researcher, broadcasting/film/video in the Last Year: Not on file  . Ran Out of Food in the Last Year: Not on file  Transportation Needs:   . Lack of Transportation (Medical): Not on file  . Lack of Transportation (Non-Medical): Not on file  Physical Activity:   . Days of Exercise per Week: Not on file  . Minutes of Exercise per Session: Not on file  Stress:   . Feeling of Stress : Not on file  Social Connections:   . Frequency of Communication with Friends and Family: Not on file  . Frequency of Social Gatherings with Friends and Family: Not on file  . Attends Religious Services: Not on file  . Active Member of Clubs or Organizations: Not on file  . Attends Banker Meetings: Not on file  . Marital Status: Not on file  Intimate Partner Violence:   . Fear of Current or Ex-Partner: Not on file  . Emotionally Abused: Not on file  . Physically Abused: Not on file  . Sexually Abused: Not on file    Review of Systems: See HPI, otherwise negative ROS  Physical Exam: BP 132/81   Pulse 66   Temp 97.6 F (36.4 C) (Oral)   Ht 5\' 7"  (1.702 m)   Wt 231 lb 9.6 oz (105.1 kg)   BMI 36.27 kg/m  General:   Alert,  Well-developed, well-nourished, pleasant and cooperative in NAD Neck:  Supple; no masses or thyromegaly. No significant cervical adenopathy. Lungs:  Clear throughout to auscultation.   No wheezes, crackles, or rhonchi. No acute distress. Heart:  Regular rate and rhythm; no murmurs, clicks, rubs,  or gallops. 1. Abdomen: Obese.   Positive bowel sounds soft and nontender without appreciable mass organomegaly  pulses:  Normal pulses noted. Extremities:  Without clubbing or edema.  Impression/Plan: 59 year old gentleman with a 83-month history of nonbloody diarrhea which started temporally related to a course of antibiotics for dental infection.  Cessation of Glucophage made no difference in his symptoms.  Clinical scenario strongly suggestive of a C. difficile associated diarrhea with other possibilities less likely.  At this time, patient does not appear acutely ill or toxic.  Recommendations:  Send stool for C diff and GIP ASAP  RX Bentyl 10 mg tablets - take one tablet before meals and at bedtime as needed (disp 30 with 1 refill)  Continue Protonix 40 mg twice daily  Further recommendations to follow     Notice: This dictation was prepared with Dragon dictation along with smaller phrase technology. Any transcriptional errors that result from this process are unintentional and may not be corrected upon review.

## 2019-11-21 NOTE — Patient Instructions (Signed)
Send stool for C diff and GIP ASAP  RX Bentyl 10 mg tablets - take one tablet before meals and at bedtime as needed (disp 30 with 1 refill)  Continue Protonix 40 mg twice daily  Further recommendations to follow

## 2019-11-24 ENCOUNTER — Telehealth: Payer: Self-pay | Admitting: Internal Medicine

## 2019-11-24 NOTE — Telephone Encounter (Signed)
Pt was seen Friday 11/21/19 and hasn't been able to submit stool studies due stool being formed. Pt asked if he could take a laxative to have loose stool. Pt was advised to submit testing when diarrhea returns.

## 2019-11-24 NOTE — Telephone Encounter (Signed)
Pt has question about his stool sample he is trying to collect. [phone]

## 2019-12-01 NOTE — Progress Notes (Signed)
Lmom, waiting on a return call.  

## 2020-04-05 ENCOUNTER — Other Ambulatory Visit: Payer: Self-pay | Admitting: Internal Medicine

## 2020-08-02 NOTE — Progress Notes (Deleted)
Cardiology Office Note    Date:  08/02/2020   ID:  Greg Alvarez, DOB 12-Nov-1960, MRN [id number]  PCP:  Greg Alvarez., FNP  Cardiologist:  Greg Balaban [place], MD   No chief complaint on file.     History of Present Illness: Greg Alvarez is a 60 y.o. male who is seen for follow up CAD. Last seen 2 years ago. He has a history of HTN, HLD, OSA and DM. He presented in 7/18 to [facility] for evaluation of an episode of dizziness, shortness of breath, chest tightness,and aphasia. His troponin was negative and a stress echocardiogram was ordered. His stress test was positive for inducible ischemia. EF was normal at rest. Patient had a left heart cath on 09/14/2016. Cath was significant for 75% prox ramus, 70% RPDA, 60% RPL and 75% mCx (small). Culprit lesion was a 90% prox/mid LAD lesion beginning at 1st septal and spans into mid segment. A 2.75 x 28 mm Promus stent was placed in the LAD and post dilated to 3.25 mm.  I personally reviewed cardiac cath films from [facility] on 09/14/16 and concur with the findings noted above.  He had a Myoview 03/15/18 and this showed normal perfusion and normal EF.   He does have a history of OSA. Was told in the past it was severe. He was on CPAP with improvement in his symptoms but states he hasn't been able to tolerate CPAP or nasal Bipap due to his panic disorder. 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   On follow up today he is doing well. No chest pain or dyspnea. He does walk when the weather is OK. He was started on Ozempic for his DM but is now having daily diarrhea and nausea. He has lost 6 lbs.      Past Medical History:  Diagnosis Date  . Coronary artery disease   . Diabetes mellitus type 2, noninsulin dependent (HCC)   . High cholesterol   . Hypertension    under control with med., has been on med. x 10 yr.  . 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 Panic attacks   . Rheumatoid  arthritis (HCC)   . Tenosynovitis of wrist 03/2013   right  . Wears glasses     Past Surgical History:  Procedure Laterality Date  . APPENDECTOMY  1980  . BIOPSY  04/11/2018   Procedure: BIOPSY;  Surgeon: Corbin Ade, MD;  [facility];  Service: Endoscopy;;  esophagus  . CARDIAC CATHETERIZATION  2002    states no problem - was [place]  . CARPAL TUNNEL RELEASE Left 07/29/2013   Procedure: LEFT CARPAL TUNNEL RELEASE;  Surgeon: Nicki Reaper, MD;  Location: [facility];  Service: Orthopedics;  Laterality: Left;  . COLONOSCOPY N/A 01/12/2014   Procedure: COLONOSCOPY;  Surgeon: Corbin Ade, MD;  [facility];  Service: Endoscopy;  Laterality: N/A;  9:30 AM  . ESOPHAGOGASTRODUODENOSCOPY (EGD) WITH PROPOFOL N/A 04/11/2018   Procedure: ESOPHAGOGASTRODUODENOSCOPY (EGD) WITH PROPOFOL;  Surgeon: Corbin Ade, MD;  [facility];  Service: Endoscopy;  Laterality: N/A;  1:15pm  . SHOULDER ARTHROSCOPY W/ ROTATOR CUFF REPAIR  4/15   left-DSC  . TENDON REPAIR Right    hand  . TENOSYNOVECTOMY Right 04/08/2013   Procedure: TENOSYNOVECTOMY/FLEXOR TENDONS RIGHT WRIST;  Surgeon: Nicki Reaper, MD;  Location: [facility];  Service: Orthopedics;  Laterality: Right;     Current Outpatient Medications  Medication Sig Dispense Refill  . amLODipine (NORVASC) 5 MG tablet Take 5 mg by mouth daily.    Marland Kitchen aspirin 81 MG tablet Take 81 mg by mouth daily.    Marland Kitchen atorvastatin (LIPITOR) 40 MG tablet Take 40 mg by mouth daily.    . calcium gluconate 500 MG tablet Take 1 tablet by mouth 2 (two) times daily.  (Patient not taking: Reported on 11/21/2019)    . citalopram (CELEXA) 40 MG tablet Take 20-40 mg by mouth See admin instructions. 40 mg in the morning, 20 mg at 2pm    . dicyclomine (BENTYL) 10 MG capsule TAKE 1 CAPSULE(10 MG) BY MOUTH FOUR TIMES DAILY BEFORE MEALS AND AT BEDTIME AS NEEDED 120 capsule 2  . losartan (COZAAR) 50 MG tablet Take 50 mg by  mouth daily.    . Magnesium Oxide 420 MG TABS Take 1 tablet by mouth at bedtime.  (Patient not taking: Reported on 11/21/2019)    . metFORMIN (GLUCOPHAGE) 1000 MG tablet 1,000 mg 2 (two) times daily.    . Methotrexate Sodium, PF, 50 MG/2ML SOLN 0.6 mLs once a week.  (Patient not taking: Reported on 11/21/2019)  1  . Multiple Vitamin (ONE-A-DAY MENS PO) Take 1 tablet by mouth daily.     . Omega-3 Fatty Acids (FISH OIL) 1000 MG CAPS Take 1 capsule by mouth 2 (two) times daily.    . pantoprazole (PROTONIX) 40 MG tablet Take 1 tablet (40 mg total) by mouth 2 (two) times daily before a meal. 180 tablet 1  . Semaglutide (OZEMPIC, 0.25 OR 0.5 MG/DOSE, [place]) Inject 0.5 mg into the skin once a week. Fridays    . traZODone (DESYREL) 100 MG tablet Take 100 mg by mouth at bedtime.    . vitamin C (ASCORBIC ACID) 500 MG tablet Take 500 mg by mouth daily. (Patient not taking: Reported on 11/21/2019)     No current facility-administered medications for this visit.    Allergies:   Ativan [lorazepam]    Social History:  The patient  reports that he quit smoking about 22 years ago. His smoking use included cigarettes. He has never used smokeless tobacco. He reports previous alcohol use. He reports that he does not use drugs.   Family History:  The patient's family history includes Anesthesia problems in his sister; Epilepsy in his mother; Heart attack in his brother and father; Heart disease in his brother and father; Heart failure in his father; Multiple sclerosis in his sister; Parkinson's disease in his father and sister; Stroke in his brother.    ROS:  Please see the history of present illness.   Otherwise, review of systems are positive for .   All other systems are reviewed and negative.    PHYSICAL EXAM: VS:  There were no vitals taken for this visit. , BMI There is no height or weight on file to calculate BMI. GENERAL:  Well appearing WM in NAD HEENT:  PERRL, EOMI, sclera are clear. Oropharynx is  clear. NECK:  No jugular venous distention, carotid upstroke brisk and symmetric, no bruits, no thyromegaly or adenopathy LUNGS:  Clear to auscultation bilaterally CHEST:  Unremarkable HEART:  RRR,  PMI not displaced or sustained,S1 and S2 within normal limits, no S3, no S4: no clicks, no rubs, no murmurs ABD:  Soft, nontender. BS +, no masses or bruits. No hepatomegaly, no splenomegaly EXT:  2 + pulses throughout, no edema,  no cyanosis no clubbing SKIN:  Warm and dry.  No rashes NEURO:  Alert and oriented x 3. Cranial nerves II through XII intact. PSYCH:  Cognitively intact  EKG:  EKG is not ordered today.    Recent Labs: No results found for requested labs within last 8760 hours.    Lipid Panel    Component Value Date/Time   CHOL 140 03/15/2018 1223   TRIG 204 (H) 03/15/2018 1223   HDL 33 (L) 03/15/2018 1223   CHOLHDL 4.2 03/15/2018 1223   CHOLHDL 8.3 03/11/2008 0540   VLDL 50 (H) 03/11/2008 0540   LDLCALC 66 03/15/2018 1223  Labs dated 12/17/13: WBC 10.6. platelets 416K. Hgb 14.3. Cholesterol 295, triglycerides 281, HDL 35, LDL 204. Aic 6.4%. creatinine normal.     Labs dated 09/15/16: glucose 160. Other chemistries normal. Cholesterol 150, triglycerides 314, HDL 27, LDL 86. Hgb 13.5.   Labs from [place] one year ago: cholesterol 102, triglycerides 88, HDL 32, LDL 52.   Wt Readings from Last 3 Encounters:  11/21/19 231 lb 9.6 oz (105.1 kg)  04/11/18 247 lb (112 kg)  04/09/18 249 lb 6.4 oz (113.1 kg)      Other studies Reviewed: Additional studies/ records that were reviewed today include:   Myoview 03/15/18: Study Highlights    Nuclear stress EF: 56%. The left ventricular ejection fraction is normal (55-65%).  There was no ST segment deviation noted during stress.  This is a low risk study. No evidence of ischemia or previous infarction  The study is normal.      ASSESSMENT AND PLAN:  1.  CAD s/p NSTEMI in July 2018. Had DES of LAD. Moderate residual disease  in multiple branches. EF normal. On ASA. He did experience chest pain in November related to stress. Follow up Myoview was normal.  2. DM type 2 not on insulin. Per primary care. Not tolerating Ozempic well due to GI side effects.  3. Hypercholesterolemia. On lipitor 40 mg daily and fish oil 2 grams daily. Last lipid panel was at goal with LDL 66. Triglycerides mildly elevated reflecting glycemic control. Continue lipitor.  4. HTN controlled. 5. Obesity with OSA. Intolerant of mask due to panic attacks- followed at [facility]. He is going to check and see if there is another mask he can try. 6. RA on methotrexate   Current medicines are reviewed at length with the patient today.  The patient does not have concerns regarding medicines.  The following changes have been made:  no change  Labs/ tests ordered today include:   No orders of the defined types were placed in this encounter.    Disposition:   FU with me in 6 months  Signed, Montrell Cessna [place], MD  08/02/2020 1:15 PM    [facility] [address] [place], [place], [address] Phone [phone], Fax [phone]

## 2020-08-16 ENCOUNTER — Ambulatory Visit: Payer: Medicare HMO | Admitting: Cardiology

## 2020-09-01 NOTE — Progress Notes (Signed)
Cardiology Office Note    Date:  09/03/2020   ID:  Greg Alvarez, DOB 1960/11/29, MRN [id number]  PCP:  Raymon Mutton., FNP  Cardiologist:  Kaydan Wilhoite Swaziland, MD   Chief Complaint  Patient presents with   Coronary Artery Disease       History of Present Illness: Greg Alvarez is a 60 y.o. male who is seen for follow up CAD. Last seen 2 years ago. He has a history of HTN, HLD, OSA and DM. He presented in 7/18 to [facility] for evaluation of an episode of dizziness, shortness of breath, chest tightness, and aphasia. His troponin was negative and a stress echocardiogram was ordered. His stress test was positive for inducible ischemia. EF was normal at rest. Patient had a left heart cath on 09/14/2016. Cath was significant for 75% prox ramus, 70% RPDA, 60% RPL and 75% mCx (small). Culprit lesion was a 90% prox/mid LAD lesion beginning at 1st septal and spans into mid segment. A 2.75 x 28 mm Promus stent was placed in the LAD and post dilated to 3.25 mm.  I personally reviewed cardiac cath films from [facility] on 09/14/16 and concur with the findings noted above.  He had a Myoview 03/15/18 and this showed normal perfusion and normal EF.   He does have a history of OSA. Was told in the past it was severe. He was on CPAP with improvement in his symptoms but states he hasn't been able to tolerate CPAP or nasal Bipap due to his panic disorder. He states the [place] was supposed to order a new mask but he hasn't received it yet. 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   On follow up today he is doing well. He had some minor chest pain last week but thinks it related to some back spasms he was having and it is better.  He is pretty sedentary. Followed at [facility] and lab work done there. He does have chronic dizziness with extensive evaluation in the past.    Past Medical History:  Diagnosis Date   Coronary artery disease    Diabetes mellitus type 2, noninsulin dependent (HCC)    High  cholesterol    Hypertension    under control with med., has been on med. x 10 yr.   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   left shoulder, states unable to raise left arm   Panic attacks    Rheumatoid arthritis (HCC)    Tenosynovitis of wrist 03/2013   right   Wears glasses     Past Surgical History:  Procedure Laterality Date   APPENDECTOMY  1980   BIOPSY  04/11/2018   Procedure: BIOPSY;  Surgeon: Corbin Ade, MD;  [facility];  Service: Endoscopy;;  esophagus   CARDIAC CATHETERIZATION  2002    states no problem - was done in [place]   CARPAL TUNNEL RELEASE Left 07/29/2013   Procedure: LEFT CARPAL TUNNEL RELEASE;  Surgeon: Nicki Reaper, MD;  Location: [facility];  Service: Orthopedics;  Laterality: Left;   COLONOSCOPY N/A 01/12/2014   Procedure: COLONOSCOPY;  Surgeon: Corbin Ade, MD;  [facility];  Service: Endoscopy;  Laterality: N/A;  9:30 AM   ESOPHAGOGASTRODUODENOSCOPY (EGD) WITH PROPOFOL N/A 04/11/2018   Procedure: ESOPHAGOGASTRODUODENOSCOPY (EGD) WITH PROPOFOL;  Surgeon: Corbin Ade, MD;  [facility];  Service: Endoscopy;  Laterality: N/A;  1:15pm   SHOULDER ARTHROSCOPY W/ ROTATOR CUFF  REPAIR  4/15   left-DSC   TENDON REPAIR Right    hand   TENOSYNOVECTOMY Right 04/08/2013   Procedure: TENOSYNOVECTOMY/FLEXOR TENDONS RIGHT WRIST;  Surgeon: Nicki Reaper, MD;  Location: [facility];  Service: Orthopedics;  Laterality: Right;     Current Outpatient Medications  Medication Sig Dispense Refill   amLODipine (NORVASC) 5 MG tablet Take 5 mg by mouth daily.     aspirin 81 MG tablet Take 81 mg by mouth daily.     atorvastatin (LIPITOR) 40 MG tablet Take 40 mg by mouth daily.     citalopram (CELEXA) 40 MG tablet Take 20-40 mg by mouth See admin instructions. 40 mg in the morning, 20 mg at 2pm     losartan (COZAAR) 50 MG tablet Take 50 mg by mouth daily.     metFORMIN (GLUCOPHAGE) 1000 MG tablet 1,000 mg 2 (two)  times daily.     Multiple Vitamin (ONE-A-DAY MENS PO) Take 1 tablet by mouth daily.      Omega-3 Fatty Acids (FISH OIL) 1000 MG CAPS Take 1 capsule by mouth 2 (two) times daily.     pantoprazole (PROTONIX) 40 MG tablet Take 1 tablet (40 mg total) by mouth 2 (two) times daily before a meal. 180 tablet 1   Semaglutide (OZEMPIC, 0.25 OR 0.5 MG/DOSE, [place]) Inject 0.5 mg into the skin once a week. Fridays     traZODone (DESYREL) 100 MG tablet Take 100 mg by mouth at bedtime.     No current facility-administered medications for this visit.    Allergies:   Ativan [lorazepam]    Social History:  The patient  reports that he quit smoking about 22 years ago. His smoking use included cigarettes. He has never used smokeless tobacco. He reports previous alcohol use. He reports that he does not use drugs.   Family History:  The patient's family history includes Anesthesia problems in his sister; Epilepsy in his mother; Heart attack in his brother and father; Heart disease in his brother and father; Heart failure in his father; Multiple sclerosis in his sister; Parkinson's disease in his father and sister; Stroke in his brother.    ROS:  Please see the history of present illness.   Otherwise, review of systems are positive for .   All other systems are reviewed and negative.    PHYSICAL EXAM: VS:  BP 136/84   Pulse 63   Resp 18   Ht 5\' 6"  (1.676 m)   Wt 227 lb 6.4 oz (103.1 kg)   SpO2 97%   BMI 36.70 kg/m  , BMI Body mass index is 36.7 kg/m. GENERAL:  Well appearing WM in NAD HEENT:  PERRL, EOMI, sclera are clear. Oropharynx is clear. NECK:  No jugular venous distention, carotid upstroke brisk and symmetric, no bruits, no thyromegaly or adenopathy LUNGS:  Clear to auscultation bilaterally CHEST:  Unremarkable HEART:  RRR,  PMI not displaced or sustained,S1 and S2 within normal limits, no S3, no S4: no clicks, no rubs, no murmurs ABD:  Soft, nontender. BS +, no masses or bruits. No hepatomegaly,  no splenomegaly EXT:  2 + pulses throughout, no edema, no cyanosis no clubbing SKIN:  Warm and dry.  No rashes NEURO:  Alert and oriented x 3. Cranial nerves II through XII intact. PSYCH:  Cognitively intact  EKG:  EKG is  ordered today.NSR rate 63 with first degree AV block. Otherwise normal. I have personally reviewed and interpreted this study.  Recent Labs: No results found for requested labs within last 8760 hours.    Lipid Panel    Component Value Date/Time   CHOL 140 03/15/2018 1223   TRIG 204 (H) 03/15/2018 1223   HDL 33 (L) 03/15/2018 1223   CHOLHDL 4.2 03/15/2018 1223   CHOLHDL 8.3 03/11/2008 0540   VLDL 50 (H) 03/11/2008 0540   LDLCALC 66 03/15/2018 1223   Labs dated 12/17/13: WBC 10.6. platelets 416K. Hgb 14.3. Cholesterol 295, triglycerides 281, HDL 35, LDL 204. Aic 6.4%. creatinine normal.     Labs dated 09/15/16: glucose 160. Other chemistries normal. Cholesterol 150, triglycerides 314, HDL 27, LDL 86. Hgb 13.5.   Labs from [place] one year ago: cholesterol 102, triglycerides 88, HDL 32, LDL 52.   Wt Readings from Last 3 Encounters:  09/03/20 227 lb 6.4 oz (103.1 kg)  11/21/19 231 lb 9.6 oz (105.1 kg)  04/11/18 247 lb (112 kg)      Other studies Reviewed: Additional studies/ records that were reviewed today include:   Myoview 03/15/18: Study Highlights   Nuclear stress EF: 56%. The left ventricular ejection fraction is normal (55-65%). There was no ST segment deviation noted during stress. This is a low risk study. No evidence of ischemia or previous infarction The study is normal.      ASSESSMENT AND PLAN:  1.  CAD s/p NSTEMI in July 2018. Had DES of LAD. Moderate residual disease in multiple branches. EF normal. On ASA. He did experience chest pain in November 2019 related to stress. Follow up Myoview was normal. He is symptomatically stable. Continue ASA, amlodipine, statin.   2. DM type 2 not on insulin. Per primary care.   3.  Hypercholesterolemia. On lipitor 40 mg daily. Goal LDL < 70. Will request a copy of lab from primary care.   4. HTN controlled.  5. Obesity with OSA. Intolerant of mask due to panic attacks- followed at [facility]. Supposed to get a new mask.   6. RA on methotrexate   Current medicines are reviewed at length with the patient today.  The patient does not have concerns regarding medicines.  The following changes have been made:  no change  Labs/ tests ordered today include:   No orders of the defined types were placed in this encounter.    Disposition:   FU with me in one year.  Signed, Tavaris Eudy Swaziland, MD  09/03/2020 1:31 PM    [facility] [address] [place], [place], [address] Phone [phone], Fax [phone]

## 2020-09-03 ENCOUNTER — Encounter: Payer: Self-pay | Admitting: Cardiology

## 2020-09-03 ENCOUNTER — Ambulatory Visit: Payer: Medicare HMO | Admitting: Cardiology

## 2020-09-03 ENCOUNTER — Other Ambulatory Visit: Payer: Self-pay

## 2020-09-03 VITALS — BP 136/84 | HR 63 | Resp 18 | Ht 66.0 in | Wt 227.4 lb

## 2020-09-03 DIAGNOSIS — I10 Essential (primary) hypertension: Secondary | ICD-10-CM

## 2020-09-03 DIAGNOSIS — E7800 Pure hypercholesterolemia, unspecified: Secondary | ICD-10-CM

## 2020-09-03 DIAGNOSIS — E119 Type 2 diabetes mellitus without complications: Secondary | ICD-10-CM

## 2020-09-03 DIAGNOSIS — I25118 Atherosclerotic heart disease of native coronary artery with other forms of angina pectoris: Secondary | ICD-10-CM | POA: Diagnosis not present

## 2020-11-29 ENCOUNTER — Emergency Department (HOSPITAL_COMMUNITY): Payer: No Typology Code available for payment source

## 2020-11-29 ENCOUNTER — Other Ambulatory Visit: Payer: Self-pay

## 2020-11-29 ENCOUNTER — Emergency Department (HOSPITAL_COMMUNITY)
Admission: EM | Admit: 2020-11-29 | Discharge: 2020-11-29 | Disposition: A | Discharge status: Home / Self Care | Payer: No Typology Code available for payment source | Attending: Emergency Medicine | Admitting: Emergency Medicine

## 2020-11-29 ENCOUNTER — Encounter (HOSPITAL_COMMUNITY): Payer: Self-pay | Admitting: *Deleted

## 2020-11-29 ENCOUNTER — Telehealth: Payer: Self-pay | Admitting: Cardiology

## 2020-11-29 DIAGNOSIS — I10 Essential (primary) hypertension: Secondary | ICD-10-CM | POA: Insufficient documentation

## 2020-11-29 DIAGNOSIS — Z7984 Long term (current) use of oral hypoglycemic drugs: Secondary | ICD-10-CM | POA: Diagnosis not present

## 2020-11-29 DIAGNOSIS — R110 Nausea: Secondary | ICD-10-CM | POA: Insufficient documentation

## 2020-11-29 DIAGNOSIS — R519 Headache, unspecified: Secondary | ICD-10-CM | POA: Insufficient documentation

## 2020-11-29 DIAGNOSIS — Z79899 Other long term (current) drug therapy: Secondary | ICD-10-CM | POA: Insufficient documentation

## 2020-11-29 DIAGNOSIS — R0789 Other chest pain: Secondary | ICD-10-CM | POA: Diagnosis not present

## 2020-11-29 DIAGNOSIS — Z87891 Personal history of nicotine dependence: Secondary | ICD-10-CM | POA: Insufficient documentation

## 2020-11-29 DIAGNOSIS — K219 Gastro-esophageal reflux disease without esophagitis: Secondary | ICD-10-CM | POA: Insufficient documentation

## 2020-11-29 DIAGNOSIS — E119 Type 2 diabetes mellitus without complications: Secondary | ICD-10-CM | POA: Insufficient documentation

## 2020-11-29 DIAGNOSIS — Z7982 Long term (current) use of aspirin: Secondary | ICD-10-CM | POA: Diagnosis not present

## 2020-11-29 DIAGNOSIS — I2510 Atherosclerotic heart disease of native coronary artery without angina pectoris: Secondary | ICD-10-CM | POA: Diagnosis not present

## 2020-11-29 DIAGNOSIS — R079 Chest pain, unspecified: Secondary | ICD-10-CM

## 2020-11-29 DIAGNOSIS — R0602 Shortness of breath: Secondary | ICD-10-CM | POA: Insufficient documentation

## 2020-11-29 LAB — CBC
HCT: 40.7 % (ref 39.0–52.0)
Hemoglobin: 13.5 g/dL (ref 13.0–17.0)
MCH: 31.3 pg (ref 26.0–34.0)
MCHC: 33.2 g/dL (ref 30.0–36.0)
MCV: 94.2 fL (ref 80.0–100.0)
Platelets: 367 10*3/uL (ref 150–400)
RBC: 4.32 MIL/uL (ref 4.22–5.81)
RDW: 12.9 % (ref 11.5–15.5)
WBC: 12.3 10*3/uL — ABNORMAL HIGH (ref 4.0–10.5)
nRBC: 0 % (ref 0.0–0.2)

## 2020-11-29 LAB — COMPREHENSIVE METABOLIC PANEL
ALT: 21 U/L (ref 0–44)
AST: 19 U/L (ref 15–41)
Albumin: 4.3 g/dL (ref 3.5–5.0)
Alkaline Phosphatase: 78 U/L (ref 38–126)
Anion gap: 8 (ref 5–15)
BUN: 17 mg/dL (ref 6–20)
CO2: 29 mmol/L (ref 22–32)
Calcium: 9.6 mg/dL (ref 8.9–10.3)
Chloride: 101 mmol/L (ref 98–111)
Creatinine, Ser: 0.87 mg/dL (ref 0.61–1.24)
GFR, Estimated: >60 mL/min (ref >60)
Glucose, Bld: 117 mg/dL — ABNORMAL HIGH (ref 70–99)
Potassium: 4.3 mmol/L (ref 3.5–5.1)
Sodium: 138 mmol/L (ref 135–145)
Total Bilirubin: 0.7 mg/dL (ref 0.3–1.2)
Total Protein: 7.9 g/dL (ref 6.5–8.1)

## 2020-11-29 LAB — POC OCCULT BLOOD, ED: Fecal Occult Bld: NEGATIVE

## 2020-11-29 LAB — TROPONIN I (HIGH SENSITIVITY)
Troponin I (High Sensitivity): 2 ng/L (ref <18)
Troponin I (High Sensitivity): 3 ng/L (ref <18)

## 2020-11-29 NOTE — Discharge Instructions (Signed)
As we discussed your work-up was reassuring today.  However given your history of coronary artery disease I recommend that you follow-up with your cardiologist this week.  Please continue take your blood pressure, cholesterol medications.  We did not find any evidence of microscopic blood in your stool which is reassuring that you do not have any sort of gastrointestinal bleed going on.  You should continue to take your pantoprazole as needed for reflux.  Please follow-up if your symptoms worsen or fail to improve.

## 2020-11-29 NOTE — Telephone Encounter (Signed)
Agree needs ED evaluation  Zackarie Chason Swaziland MD, [facility]

## 2020-11-29 NOTE — ED Provider Notes (Signed)
[facility] EMERGENCY DEPARTMENT Provider Note   CSN: [id number] Arrival date & time: 11/29/20  1056     History Chief Complaint  Patient presents with   Chest Pain    Greg Alvarez is a 60 y.o. male with a past medical history significant for diabetes, coronary artery disease, hypercholesterolemia, hypertension who presents with 2 weeks of intermittent sharp left-sided chest pain with radiation to the back.  Patient reports that he started having constant chest pain around 5 or 6 this morning, patient reports the pain is sharp, not associated with activities, 6-7/10 in severity.  Patient reports he took 1 aspirin 81 mg this morning without any relief of pain.  Patient had a cardiac catheterization back in 2002 with a stent placed in his left anterior descending artery. Patient reports no change in pain with sitting up or standing down.  Patient has endorsed some intermittent nausea, without vomiting.  Patient endorses shortness of breath with his chest pain.  Patient denies cough, fever, stomach pain.  Patient denies dysuria, urinary frequency.  Patient does endorse one episode of bloody stool around 2 days ago without pain.  Patient denies history of same, has not had any bloody stools since this event.  Patient denies history of hemorrhoids.  Patient also endorses a headache that started gradually this morning, not associated with blurry vision, photosensitivity, phono sensitivity, is located frontally behind his eyes.  Patient reports that he does not normally get headaches.  Patient has had no difficulty walking, no weakness, no difficulty speaking, no facial droop.   Chest Pain Associated symptoms: headache, nausea and shortness of breath   Associated symptoms: no abdominal pain, no cough, no diaphoresis, no numbness, no palpitations, no vomiting and no weakness       Past Medical History:  Diagnosis Date   Coronary artery disease    Diabetes mellitus type 2, noninsulin dependent  (HCC)    High cholesterol    Hypertension    under control with med., has been on med. x 10 yr.   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   left shoulder, states unable to raise left arm   Panic attacks    Rheumatoid arthritis (HCC)    Tenosynovitis of wrist 03/2013   right   Wears glasses     Patient Active Problem List   Diagnosis Date Noted   GERD (gastroesophageal reflux disease) 03/18/2018   Nausea without vomiting 03/18/2018   Unstable angina (HCC) 09/15/2016   Screening for colorectal cancer     Past Surgical History:  Procedure Laterality Date   APPENDECTOMY  02/27/1978   BIOPSY  04/11/2018   Procedure: BIOPSY;  Surgeon: Corbin Ade, MD;  [facility];  Service: Endoscopy;;  esophagus   CARDIAC CATHETERIZATION  02/28/2000   1 stent placed in LAD   CARPAL TUNNEL RELEASE Left 07/29/2013   Procedure: LEFT CARPAL TUNNEL RELEASE;  Surgeon: Nicki Reaper, MD;  Location: [facility];  Service: Orthopedics;  Laterality: Left;   COLONOSCOPY N/A 01/12/2014   Procedure: COLONOSCOPY;  Surgeon: Corbin Ade, MD;  [facility];  Service: Endoscopy;  Laterality: N/A;  9:30 AM   ESOPHAGOGASTRODUODENOSCOPY (EGD) WITH PROPOFOL N/A 04/11/2018   Procedure: ESOPHAGOGASTRODUODENOSCOPY (EGD) WITH PROPOFOL;  Surgeon: Corbin Ade, MD;  [facility];  Service: Endoscopy;  Laterality: N/A;  1:15pm   SHOULDER ARTHROSCOPY W/ ROTATOR CUFF REPAIR  05/28/2013   left-DSC   TENDON REPAIR Right    hand  TENOSYNOVECTOMY Right 04/08/2013   Procedure: TENOSYNOVECTOMY/FLEXOR TENDONS RIGHT WRIST;  Surgeon: Nicki Reaper, MD;  Location: [facility];  Service: Orthopedics;  Laterality: Right;       Family History  Problem Relation Age of Onset   Anesthesia problems Sister        hard to wake up post-op   Parkinson's disease Sister    Multiple sclerosis Sister    Epilepsy Mother    Heart disease Father    Parkinson's disease Father     Heart failure Father    Heart attack Father    Heart disease Brother    Heart attack Brother    Stroke Brother    Colon cancer Neg Hx    Gastric cancer Neg Hx    Esophageal cancer Neg Hx     Social History   Tobacco Use   Smoking status: Former    Types: Cigarettes    Quit date: 03/13/1998    Years since quitting: 22.7   Smokeless tobacco: Never  Vaping Use   Vaping Use: Never used  Substance Use Topics   Alcohol use: Not Currently    Comment: once or twice a year   Drug use: No    Home Medications Prior to Admission medications   Medication Sig Start Date End Date Taking? Authorizing Provider  amLODipine (NORVASC) 5 MG tablet Take 5 mg by mouth daily.    [provider]  amLODipine (NORVASC) 5 MG tablet Take 1 tablet by mouth daily. 10/30/19   [provider]  aspirin 81 MG chewable tablet CHEW ONE TABLET BY MOUTH IN THE MORNING 04/25/14   [provider]  aspirin 81 MG tablet Take 81 mg by mouth daily.    [provider]  atorvastatin (LIPITOR) 40 MG tablet Take 40 mg by mouth daily.    [provider]  citalopram (CELEXA) 40 MG tablet Take 20-40 mg by mouth See admin instructions. 40 mg in the morning, 20 mg at 2pm    [provider]  losartan (COZAAR) 50 MG tablet Take 50 mg by mouth daily.    [provider]  metFORMIN (GLUCOPHAGE) 1000 MG tablet 1,000 mg 2 (two) times daily. 04/05/18   [provider]  metFORMIN (GLUCOPHAGE) 1000 MG tablet Take 1 tablet by mouth 2 (two) times daily. 10/30/19   [provider]  Multiple Vitamin (ONE-A-DAY MENS PO) Take 1 tablet by mouth daily.     [provider]  naproxen (NAPROSYN) 500 MG tablet Take 1 tablet by mouth 2 (two) times daily. 10/30/19   [provider]  Omega-3 Fatty Acids (FISH OIL) 1000 MG CAPS Take 1 capsule by mouth 2 (two) times daily.    [provider]  pantoprazole (PROTONIX) 40 MG tablet Take 1 tablet (40 mg total)  by mouth 2 (two) times daily before a meal. 03/18/18   Anice Paganini, NP  pantoprazole (PROTONIX) 40 MG tablet Take 1 tablet by mouth 2 (two) times daily. 10/30/19   [provider]  Semaglutide (OZEMPIC, 0.25 OR 0.5 MG/DOSE, [place]) Inject 0.5 mg into the skin once a week. Fridays    [provider]  traZODone (DESYREL) 100 MG tablet Take 100 mg by mouth at bedtime.    [provider]    Allergies    Ativan [lorazepam]  Review of Systems   Review of Systems  Constitutional:  Negative for diaphoresis.  Respiratory:  Positive for shortness of breath. Negative for cough, wheezing and  stridor.   Cardiovascular:  Positive for chest pain. Negative for palpitations.  Gastrointestinal:  Positive for blood in stool and nausea. Negative for abdominal pain, constipation, diarrhea, rectal pain and vomiting.  Neurological:  Positive for headaches. Negative for tremors, speech difficulty, weakness, light-headedness and numbness.   Physical Exam Updated Vital Signs BP 120/87 (BP Location: Right Arm)   Pulse 63   Temp 98.5 F (36.9 C)   Resp 16   Ht 5\' 6"  (1.676 m)   Wt 104.3 kg   SpO2 97%   BMI 37.12 kg/m   Physical Exam Vitals and nursing note reviewed.  Constitutional:      General: He is not in acute distress.    Appearance: Normal appearance.     Comments: Pleasant older man sitting up in chair in no acute distress  HENT:     Head: Normocephalic and atraumatic.  Eyes:     General:        Right eye: No discharge.        Left eye: No discharge.  Cardiovascular:     Rate and Rhythm: Normal rate and regular rhythm.     Pulses:          Radial pulses are 2+ on the right side and 2+ on the left side.       Dorsalis pedis pulses are 2+ on the right side and 2+ on the left side.       Posterior tibial pulses are 2+ on the right side and 2+ on the left side.     Heart sounds: No murmur heard.   No friction rub. No gallop.  Pulmonary:     Effort: Pulmonary effort is  normal.     Breath sounds: Normal breath sounds.  Abdominal:     General: Bowel sounds are normal.     Palpations: Abdomen is soft.     Tenderness: There is no abdominal tenderness.  Skin:    General: Skin is warm and dry.     Capillary Refill: Capillary refill takes less than 2 seconds.  Neurological:     Mental Status: He is alert and oriented to person, place, and time.  Psychiatric:        Mood and Affect: Mood normal.        Behavior: Behavior normal.    ED Results / Procedures / Treatments   Labs (all labs ordered are listed, but only abnormal results are displayed) Labs Reviewed  CBC - Abnormal; Notable for the following components:      Result Value   WBC 12.3 (*)    All other components within normal limits  COMPREHENSIVE METABOLIC PANEL - Abnormal; Notable for the following components:   Glucose, Bld 117 (*)    All other components within normal limits  POC OCCULT BLOOD, ED  TROPONIN I (HIGH SENSITIVITY)  TROPONIN I (HIGH SENSITIVITY)    EKG EKG Interpretation  Date/Time:  Monday November 29 2020 11:05:49 EDT Ventricular Rate:  62 PR Interval:  212 QRS Duration: 94 QT Interval:  426 QTC Calculation: 432 R Axis:   15 Text Interpretation: Sinus rhythm with 1st degree A-V block Otherwise normal ECG 1st degree av blocks is new since last tracing Confirmed by [birth year]1975 [phone]) on 11/29/2020 11:11:19 AM  Radiology DG Chest 2 View  Result Date: 11/29/2020 CLINICAL DATA:  Chest pain EXAM: CHEST - 2 VIEW COMPARISON:  Chest x-ray dated February 26, 2007 FINDINGS: The heart size and mediastinal contours are within normal limits. Both  lungs are clear. The visualized skeletal structures are unremarkable. IMPRESSION: No active cardiopulmonary disease. Electronically Signed   By: Allegra Lai M.D.   On: 11/29/2020 11:54    Procedures Procedures   Medications Ordered in ED Medications - No data to display  ED Course  I have reviewed the triage vital signs and the  nursing notes.  Pertinent labs & imaging results that were available during my care of the patient were reviewed by me and considered in my medical decision making (see chart for details).  Clinical Course as of 11/29/20 1546  Mon Nov 29, 2020  1127 Initial EKG without ischemic changes however new first-degree AV block since last EKG. [CP]    Clinical Course User Index [CP] Mayah Urquidi, Edyth Gunnels   MDM Rules/Calculators/A&P                         I discussed this case with my attending physician who cosigned this note including patient's presenting symptoms, physical exam, and planned diagnostics and interventions. Attending physician stated agreement with plan or made changes to plan which were implemented.   Patient presents with chest pain.  Patient has a history of cardiac chest pain including a stent placed in the left anterior descending artery.  EKG is nonischemic in appearance, is notable for a new first-degree AV block.  Chest x-ray is without abnormality.  Troponin negative x2.  Patient had endorsed some bloody stool, however today Hemoccult is negative.  Discussed traumatic straining injury versus small internal hemorrhoids may cause a small amount of bloody stool.  Recommend follow-up with GI.  Given the large differential diagnosis for LAURANCE HEIDE, the decision making in this case is of high complexity.  After evaluating all of the data points in this case, the presentation of JASRAJ LAPPE is NOT consistent with Acute Coronary Syndrome (ACS) and/or myocardial ischemia, pulmonary embolism, aortic dissection; Borhaave's, significant arrythmia, pneumothorax, cardiac tamponade, or other emergent cardiopulmonary condition.  Further, the presentation of KEIJI MELLAND is NOT consistent with pericarditis, myocarditis, cholecystitis, pancreatitis, mediastinitis, endocarditis, new valvular disease.  Additionally, the presentation of Rahm Minix Nelsonis NOT consistent with flail  chest, cardiac contusion, ARDS, or significant intra-thoracic or intra-abdominal bleeding.  Moreover, this presentation is NOT consistent with pneumonia, sepsis, or pyelonephritis.  The patient has a HEART score of 4.  Patient has been present in the emergency department for many hours, was having chest pain for 5 hours prior to arrival.  Now has had negative troponins x2.  Lab work otherwise only remarkable for mildly elevated white blood cells without left shift.  Patient reports headache is resolved without treatment.  Patient has no red flag symptoms for headache including sudden onset, fever, neck stiffness, focal neurologic deficits.  Recommend close continued follow-up with cardiology, primary care.  Strict return and follow-up precautions have been given by me personally or by detailed written instruction given verbally by nursing staff using the teach back method to the patient/family/caregiver(s).  Data Reviewed/Counseling: I have reviewed the patient's vital signs, nursing notes, and other relevant tests/information. I had a detailed discussion regarding the historical points, exam findings, and any diagnostic results supporting the discharge diagnosis. I also discussed the need for outpatient follow-up and the need to return to the ED if symptoms worsen or if there are any questions or concerns that arise at home.  Final Clinical Impression(s) / ED Diagnoses Final diagnoses:  Chest pain, unspecified type  Rx / DC Orders ED Discharge Orders     None        [place] 11/29/20 1546    Linwood Dibbles, MD 11/29/20 2009

## 2020-11-29 NOTE — Telephone Encounter (Signed)
Pt c/o of Chest Pain: STAT if CP now or developed within 24 hours  1. Are you having CP right now? Yes   2. Are you experiencing any other symptoms (ex. SOB, nausea, vomiting, sweating)? headache  3. How long have you been experiencing CP? About two weeks   4. Is your CP continuous or coming and going? Comes and goes  5. Have you taken Nitroglycerin? No. Patient does not have any. Patient took an aspirin earlier  ?  Patient said his chest pain is radiating around his chest and moves back into his shoulder blade. He to his PCP at the [facility] and was told to try and get an appointment with Dr. Swaziland.

## 2020-11-29 NOTE — ED Triage Notes (Signed)
Pt c/o left sided chest pain that radiates around to his back, SOB and headaches that has been going on x 2 weeks, but worsened this morning.

## 2020-11-29 NOTE — Telephone Encounter (Signed)
Received call from patient complaining of chest pain.  Reports he has been having intermittent chest pain x 2 weeks.  States he saw his PCP at the [place] last week and was encouraged to make an appointment with Dr. Swaziland.   Pain has increased over the last few days.  Reports having SOB, fatigue, and HA.   Currently having chest pain, has been there for 1.5 hours.   Reports pain doesn't increase with exertion but SOB does.   Rest does not decrease pain.   Chest pain is radiating around his chest into his back where his shoulder blades are.  No N/V/D.     Hx: cad s/p nstemi, DM2, HLD, HTN, OSA, RA.    Advised due to active chest pain, recommend ER evaluation.  Patient agreed and verbalized understanding.

## 2021-05-02 ENCOUNTER — Encounter: Payer: Self-pay | Admitting: Internal Medicine

## 2021-05-17 ENCOUNTER — Ambulatory Visit: Payer: No Typology Code available for payment source | Admitting: Internal Medicine

## 2021-05-27 ENCOUNTER — Ambulatory Visit (INDEPENDENT_AMBULATORY_CARE_PROVIDER_SITE_OTHER): Payer: No Typology Code available for payment source | Admitting: Internal Medicine

## 2021-05-27 ENCOUNTER — Encounter: Payer: Self-pay | Admitting: Internal Medicine

## 2021-05-27 VITALS — BP 130/80 | HR 75 | Temp 97.7°F | Ht 66.0 in | Wt 225.0 lb

## 2021-05-27 DIAGNOSIS — K921 Melena: Secondary | ICD-10-CM | POA: Diagnosis not present

## 2021-05-27 DIAGNOSIS — K219 Gastro-esophageal reflux disease without esophagitis: Secondary | ICD-10-CM

## 2021-05-27 DIAGNOSIS — K2270 Barrett's esophagus without dysplasia: Secondary | ICD-10-CM | POA: Diagnosis not present

## 2021-05-27 NOTE — Progress Notes (Signed)
? ? ?Primary Care Physician:  [facility] ?Primary Gastroenterologist:  Dr. Gala Romney ? ?Pre-Procedure History & Physical: ?HPI:  Greg Alvarez is a 61 y.o. 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  Noncritical Schatzki's ring and a hiatal hernia.  Pantoprazole controls reflux symptoms very well.  Denies any dysphagia.  Last colonoscopy normal 2015-screening.  Had an episode of rectal bleeding off-and-on several months ago it stopped.  His sister just underwent a colonoscopy was found to have precancerous colon polyps.  He denies any lower GI tract symptoms.. ? ?Has not had any other interim medical problems aside from having a stent placed in 2019. ? ?Past Medical History:  ?Diagnosis Date  ? Coronary artery disease   ? Diabetes mellitus type 2, noninsulin dependent ([place])   ? High cholesterol   ? Hypertension   ? under control with med., has been on med. x 10 yr.  ? 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  ? left shoulder, states unable to raise left arm  ? Panic attacks   ? Rheumatoid arthritis ([place])   ? Tenosynovitis of wrist 03/2013  ? right  ? Wears glasses   ? ? ?Past Surgical History:  ?Procedure Laterality Date  ? APPENDECTOMY  02/27/1978  ? BIOPSY  04/11/2018  ? Procedure: BIOPSY;  Surgeon: Daneil Dolin, MD;  [facility];  Service: Endoscopy;;  esophagus  ? CARDIAC CATHETERIZATION  02/28/2000  ? 1 stent placed in LAD  ? CARPAL TUNNEL RELEASE Left 07/29/2013  ? Procedure: LEFT CARPAL TUNNEL RELEASE;  Surgeon: Wynonia Sours, MD;  Location: [place];  Service: Orthopedics;  Laterality: Left;  ? COLONOSCOPY N/A 01/12/2014  ? Procedure: COLONOSCOPY;  Surgeon: Daneil Dolin, MD;  [facility];  Service: Endoscopy;  Laterality: N/A;  9:30 AM  ? ESOPHAGOGASTRODUODENOSCOPY (EGD) WITH PROPOFOL N/A 04/11/2018  ? Procedure: ESOPHAGOGASTRODUODENOSCOPY (EGD) WITH PROPOFOL;  Surgeon: Daneil Dolin, MD;   [facility];  Service: Endoscopy;  Laterality: N/A;  1:15pm  ? SHOULDER ARTHROSCOPY W/ ROTATOR CUFF REPAIR  05/28/2013  ? left-DSC  ? TENDON REPAIR Right   ? hand  ? TENOSYNOVECTOMY Right 04/08/2013  ? Procedure: TENOSYNOVECTOMY/FLEXOR TENDONS RIGHT WRIST;  Surgeon: Wynonia Sours, MD;  Location: [place];  Service: Orthopedics;  Laterality: Right;  ? ? ?Prior to Admission medications   ?Medication Sig Start Date End Date Taking? Authorizing Provider  ?amLODipine (NORVASC) 5 MG tablet Take 1 tablet by mouth daily. 10/30/19  Yes [provider]  ?aspirin 81 MG chewable tablet CHEW ONE TABLET BY MOUTH IN THE MORNING 04/25/14  Yes [provider]  ?atorvastatin (LIPITOR) 40 MG tablet Take 40 mg by mouth daily.   Yes [provider]  ?citalopram (CELEXA) 40 MG tablet Take 20-40 mg by mouth See admin instructions. 40 mg in the morning, 20 mg at 2pm   Yes [provider]  ?losartan (COZAAR) 50 MG tablet Take 50 mg by mouth daily.   Yes [provider]  ?metFORMIN (GLUCOPHAGE) 1000 MG tablet Take 1 tablet by mouth 2 (two) times daily. 10/30/19  Yes [provider]  ?Multiple Vitamin (ONE-A-DAY MENS PO) Take 1 tablet by mouth daily.    Yes [provider]  ?Omega-3 Fatty Acids (FISH OIL) 1000 MG CAPS Take 1 capsule by mouth 2 (two) times daily.   Yes [provider]  ?pantoprazole (PROTONIX) 40 MG tablet Take 1  tablet by mouth 2 (two) times daily. 10/30/19  Yes [provider]  ?Semaglutide (OZEMPIC, 0.25 OR 0.5 MG/DOSE, [place]) Inject 0.5 mg into the skin once a week. Fridays   Yes [provider]  ?traZODone (DESYREL) 100 MG tablet Take 100 mg by mouth at bedtime.   Yes [provider]  ? ? ?Allergies as of 05/27/2021 - Review Complete 05/27/2021  ?Allergen Reaction Noted  ? Ativan [lorazepam] Other (See Comments) 04/04/2013  ? ? ?Family History  ?Problem Relation Age of Onset  ? Anesthesia problems Sister    ?     hard to wake up post-op  ? Parkinson's disease Sister   ? Multiple sclerosis Sister   ? Epilepsy Mother   ? Heart disease Father   ? Parkinson's disease Father   ? Heart failure Father   ? Heart attack Father   ? Heart disease Brother   ? Heart attack Brother   ? Stroke Brother   ? Colon cancer Neg Hx   ? Gastric cancer Neg Hx   ? Esophageal cancer Neg Hx   ? ? ?Social History  ? ?Socioeconomic History  ? Marital status: Divorced  ?  Spouse name: Not on file  ? Number of children: Not on file  ? Years of education: Not on file  ? Highest education level: Not on file  ?Occupational History  ? Not on file  ?Tobacco Use  ? Smoking status: Former  ?  Types: Cigarettes  ?  Quit date: 03/13/1998  ?  Years since quitting: 23.2  ? Smokeless tobacco: Never  ?Vaping Use  ? Vaping Use: Never used  ?Substance and Sexual Activity  ? Alcohol use: Not Currently  ?  Comment: once or twice a year  ? Drug use: No  ? Sexual activity: Yes  ?  Partners: Female  ?Other Topics Concern  ? Not on file  ?Social History Narrative  ? Not on file  ? ?Social Determinants of Health  ? ?Financial Resource Strain: Not on file  ?Food Insecurity: Not on file  ?Transportation Needs: Not on file  ?Physical Activity: Not on file  ?Stress: Not on file  ?Social Connections: Not on file  ?Intimate Partner Violence: Not on file  ? ? ?Review of Systems: ?See HPI, otherwise negative ROS ? ?Physical Exam: ?BP 130/80 (BP Location: Right Arm, Patient Position: Sitting, Cuff Size: Large)   Pulse 75   Temp 97.7 ?F (36.5 ?C) (Temporal)   Ht 5\' 6"  (1.676 m)   Wt 225 lb (102.1 kg)   SpO2 94%   BMI 36.32 kg/m?  ?General:   Alert,  Well-developed, well-nourished, pleasant and cooperative in NAD ?Mouth:  No deformity or lesions. ?Neck:  Supple; no masses or thyromegaly. No significant cervical adenopathy. ?Lungs:  Clear throughout to auscultation.   No wheezes, crackles, or rhonchi. No acute distress. ?Heart:  Regular rate and rhythm; no murmurs, clicks,  rubs,  or gallops. ?Abdomen: Non-distended, normal bowel sounds.  Soft and nontender without appreciable mass or hepatosplenomegaly.  ?Pulses:  Normal pulses noted. ?Extremities:  Without clubbing or edema. ? ?Impression/Plan:   61 year old gentleman with short segment Barrett's esophagus here for surveillance.  His reflux symptoms are well controlled.  No alarm symptoms.  Will re-assess his esophagus now;  Hopefully we can then increase surveillance interval;  .Apparent self-limiting rectal bleeding a few months ago.  Now has a positive family history of colon polyps in his sister. ? ?Recommendations: ? ?I have offered the patient  a surveillance EGD and a diagnostic colonoscopy in the near future (ASA 3) ?The risks, benefits, limitations, imponderables and alternatives regarding both EGD and colonoscopy have been reviewed with the patient. Questions have been answered. All parties agreeable.   ? ? ? ? ?Notice: This dictation was prepared with Dragon dictation along with smaller phrase technology. Any transcriptional errors that result from this process are unintentional and may not be corrected upon review.  ?

## 2021-05-27 NOTE — Patient Instructions (Signed)
It was good to see you again today! ? ?As discussed, we will schedule a surveillance colonoscopy (history of Barrett's esophagus) and a diagnostic colonoscopy (hematochezia) ASA 3 ? ?No metformin the day before your procedures.  We will schedule your procedure so that 3 days  elapses between your last dose of Ozempic and the day of the procedure. ? ?Continue pantoprazole 40 mg daily ? ?Further recommendations to follow. ?

## 2021-06-01 ENCOUNTER — Telehealth: Payer: Self-pay | Admitting: *Deleted

## 2021-06-01 ENCOUNTER — Encounter: Payer: Self-pay | Admitting: *Deleted

## 2021-06-01 MED ORDER — PEG 3350-KCL-NA BICARB-NACL 420 G PO SOLR: ORAL | 0 refills | Status: DC

## 2021-06-01 NOTE — Telephone Encounter (Signed)
LMOVM to call back to schedule tcs/egd, asa 3, Dr. Jena Gauss ?

## 2021-06-01 NOTE — Addendum Note (Signed)
Addended by: Cheron Every on: 06/01/2021 02:40 PM ? ? Modules accepted: Orders ? ?

## 2021-07-14 NOTE — Patient Instructions (Signed)
Greg Alvarez  07/14/2021     @   Your procedure is scheduled on  07/21/2021.   Report to [facility] at  0930  A.M.   Call this number if you have problems the morning of surgery:  [phone]   Remember:  Follow the diet and prep instructions given to you by the office.     DO NOT take any medications for diabetes the morning of your procedure.    Take these medicines the morning of surgery with A SIP OF WATER                  amlodipine, celexa, flexeril,atarax, protonix.     Do not wear jewelry, make-up or nail polish.  Do not wear lotions, powders, or perfumes, or deodorant.  Do not shave 48 hours prior to surgery.  Men may shave face and neck.  Do not bring valuables to the hospital.  [facility] is not responsible for any belongings or valuables.  Contacts, dentures or bridgework may not be worn into surgery.  Leave your suitcase in the car.  After surgery it may be brought to your room.  For patients admitted to the hospital, discharge time will be determined by your treatment team.  Patients discharged the day of surgery will not be allowed to drive home and must have someone with them for 24 hours.    Special instructions:   DO NOT smoke tobacco or vape for 24 hours before your procedure.  Please read over the following fact sheets that you were given. Anesthesia Post-op Instructions and Care and Recovery After Surgery      Upper Endoscopy, Adult, Care After This sheet gives you information about how to care for yourself after your procedure. Your health care provider may also give you more specific instructions. If you have problems or questions, contact your health care provider. What can I expect after the procedure? After the procedure, it is common to have: A sore throat. Mild stomach pain or discomfort. Bloating. Nausea. Follow these instructions at home:  Follow instructions from your health care provider about  what to eat or drink after your procedure. Return to your normal activities as told by your health care provider. Ask your health care provider what activities are safe for you. Take over-the-counter and prescription medicines only as told by your health care provider. If you were given a sedative during the procedure, it can affect you for several hours. Do not drive or operate machinery until your health care provider says that it is safe. Keep all follow-up visits as told by your health care provider. This is important. Contact a health care provider if you have: A sore throat that lasts longer than one day. Trouble swallowing. Get help right away if: You vomit blood or your vomit looks like coffee grounds. You have: A fever. Bloody, black, or tarry stools. A severe sore throat or you cannot swallow. Difficulty breathing. Severe pain in your chest or abdomen. Summary After the procedure, it is common to have a sore throat, mild stomach discomfort, bloating, and nausea. If you were given a sedative during the procedure, it can affect you for several hours. Do not drive or operate machinery until your health care provider says that it is safe. Follow instructions from your health care provider about what to eat or drink after your procedure. Return to your normal activities as told by your health care provider. This  information is not intended to replace advice given to you by your health care provider. Make sure you discuss any questions you have with your health care provider. Document Revised: 12/20/2018 Document Reviewed: 07/16/2017 Elsevier Patient Education  2023 Elsevier Inc. Colonoscopy, Adult, Care After The following information offers guidance on how to care for yourself after your procedure. Your health care provider may also give you more specific instructions. If you have problems or questions, contact your health care provider. What can I expect after the procedure? After  the procedure, it is common to have: A small amount of blood in your stool for 24 hours after the procedure. Some gas. Mild cramping or bloating of your abdomen. Follow these instructions at home: Eating and drinking  Drink enough fluid to keep your urine pale yellow. Follow instructions from your health care provider about eating or drinking restrictions. Resume your normal diet as told by your health care provider. Avoid heavy or fried foods that are hard to digest. Activity Rest as told by your health care provider. Avoid sitting for a long time without moving. Get up to take short walks every 1-2 hours. This is important to improve blood flow and breathing. Ask for help if you feel weak or unsteady. Return to your normal activities as told by your health care provider. Ask your health care provider what activities are safe for you. Managing cramping and bloating  Try walking around when you have cramps or feel bloated. If directed, apply heat to your abdomen as told by your health care provider. Use the heat source that your health care provider recommends, such as a moist heat pack or a heating pad. Place a towel between your skin and the heat source. Leave the heat on for 20-30 minutes. Remove the heat if your skin turns bright red. This is especially important if you are unable to feel pain, heat, or cold. You have a greater risk of getting burned. General instructions If you were given a sedative during the procedure, it can affect you for several hours. Do not drive or operate machinery until your health care provider says that it is safe. For the first 24 hours after the procedure: Do not sign important documents. Do not drink alcohol. Do your regular daily activities at a slower pace than normal. Eat soft foods that are easy to digest. Take over-the-counter and prescription medicines only as told by your health care provider. Keep all follow-up visits. This is  important. Contact a health care provider if: You have blood in your stool 2-3 days after the procedure. Get help right away if: You have more than a small spotting of blood in your stool. You have large blood clots in your stool. You have swelling of your abdomen. You have nausea or vomiting. You have a fever. You have increasing pain in your abdomen that is not relieved with medicine. These symptoms may be an emergency. Get help right away. Call 911. Do not wait to see if the symptoms will go away. Do not drive yourself to the hospital. Summary After the procedure, it is common to have a small amount of blood in your stool. You may also have mild cramping and bloating of your abdomen. If you were given a sedative during the procedure, it can affect you for several hours. Do not drive or operate machinery until your health care provider says that it is safe. Get help right away if you have a lot of blood in your stool,  nausea or vomiting, a fever, or increased pain in your abdomen. This information is not intended to replace advice given to you by your health care provider. Make sure you discuss any questions you have with your health care provider. Document Revised: 10/06/2020 Document Reviewed: 10/06/2020 Elsevier Patient Education  2023 Elsevier Inc. Monitored Anesthesia Care, Care After This sheet gives you information about how to care for yourself after your procedure. Your health care provider may also give you more specific instructions. If you have problems or questions, contact your health care provider. What can I expect after the procedure? After the procedure, it is common to have: Tiredness. Forgetfulness about what happened after the procedure. Impaired judgment for important decisions. Nausea or vomiting. Some difficulty with balance. Follow these instructions at home: For the time period you were told by your health care provider:     Rest as needed. Do not  participate in activities where you could fall or become injured. Do not drive or use machinery. Do not drink alcohol. Do not take sleeping pills or medicines that cause drowsiness. Do not make important decisions or sign legal documents. Do not take care of children on your own. Eating and drinking Follow the diet that is recommended by your health care provider. Drink enough fluid to keep your urine pale yellow. If you vomit: Drink water, juice, or soup when you can drink without vomiting. Make sure you have little or no nausea before eating solid foods. General instructions Have a responsible adult stay with you for the time you are told. It is important to have someone help care for you until you are awake and alert. Take over-the-counter and prescription medicines only as told by your health care provider. If you have sleep apnea, surgery and certain medicines can increase your risk for breathing problems. Follow instructions from your health care provider about wearing your sleep device: Anytime you are sleeping, including during daytime naps. While taking prescription pain medicines, sleeping medicines, or medicines that make you drowsy. Avoid smoking. Keep all follow-up visits as told by your health care provider. This is important. Contact a health care provider if: You keep feeling nauseous or you keep vomiting. You feel light-headed. You are still sleepy or having trouble with balance after 24 hours. You develop a rash. You have a fever. You have redness or swelling around the IV site. Get help right away if: You have trouble breathing. You have new-onset confusion at home. Summary For several hours after your procedure, you may feel tired. You may also be forgetful and have poor judgment. Have a responsible adult stay with you for the time you are told. It is important to have someone help care for you until you are awake and alert. Rest as told. Do not drive or operate  machinery. Do not drink alcohol or take sleeping pills. Get help right away if you have trouble breathing, or if you suddenly become confused. This information is not intended to replace advice given to you by your health care provider. Make sure you discuss any questions you have with your health care provider. Document Revised: 01/18/2021 Document Reviewed: 01/16/2019 Elsevier Patient Education  2023 [employer].

## 2021-07-18 ENCOUNTER — Encounter (HOSPITAL_COMMUNITY): Payer: Self-pay

## 2021-07-18 ENCOUNTER — Encounter (HOSPITAL_COMMUNITY)
Admission: RE | Admit: 2021-07-18 | Discharge: 2021-07-18 | Disposition: A | Discharge status: Home / Self Care | Payer: Medicare HMO | Source: Ambulatory Visit | Attending: Internal Medicine | Admitting: Internal Medicine

## 2021-07-18 VITALS — BP 142/87 | HR 71 | Temp 97.8°F | Resp 18 | Ht 66.0 in | Wt 235.0 lb

## 2021-07-18 DIAGNOSIS — Z01812 Encounter for preprocedural laboratory examination: Secondary | ICD-10-CM | POA: Insufficient documentation

## 2021-07-18 DIAGNOSIS — E119 Type 2 diabetes mellitus without complications: Secondary | ICD-10-CM | POA: Insufficient documentation

## 2021-07-18 HISTORY — DX: Acute myocardial infarction, unspecified: I21.9

## 2021-07-18 HISTORY — DX: Gastro-esophageal reflux disease without esophagitis: K21.9

## 2021-07-18 HISTORY — DX: Sleep apnea, unspecified: G47.30

## 2021-07-18 LAB — BASIC METABOLIC PANEL
Anion gap: 3 — ABNORMAL LOW (ref 5–15)
BUN: 10 mg/dL (ref 6–20)
CO2: 27 mmol/L (ref 22–32)
Calcium: 9.1 mg/dL (ref 8.9–10.3)
Chloride: 109 mmol/L (ref 98–111)
Creatinine, Ser: 0.8 mg/dL (ref 0.61–1.24)
GFR, Estimated: >60 mL/min (ref >60)
Glucose, Bld: 152 mg/dL — ABNORMAL HIGH (ref 70–99)
Potassium: 4.1 mmol/L (ref 3.5–5.1)
Sodium: 139 mmol/L (ref 135–145)

## 2021-07-21 ENCOUNTER — Ambulatory Visit (HOSPITAL_COMMUNITY): Payer: No Typology Code available for payment source | Admitting: Anesthesiology

## 2021-07-21 ENCOUNTER — Ambulatory Visit (HOSPITAL_COMMUNITY)
Admission: RE | Admit: 2021-07-21 | Discharge: 2021-07-21 | Disposition: A | Discharge status: Home / Self Care | Payer: No Typology Code available for payment source | Attending: Internal Medicine | Admitting: Internal Medicine

## 2021-07-21 ENCOUNTER — Ambulatory Visit (HOSPITAL_BASED_OUTPATIENT_CLINIC_OR_DEPARTMENT_OTHER): Payer: No Typology Code available for payment source | Admitting: Anesthesiology

## 2021-07-21 ENCOUNTER — Encounter (HOSPITAL_COMMUNITY)
Admission: RE | Disposition: A | Discharge status: Home / Self Care | Payer: Self-pay | Source: Home / Self Care | Attending: Internal Medicine

## 2021-07-21 ENCOUNTER — Encounter (HOSPITAL_COMMUNITY): Payer: Self-pay | Admitting: Internal Medicine

## 2021-07-21 DIAGNOSIS — I10 Essential (primary) hypertension: Secondary | ICD-10-CM | POA: Insufficient documentation

## 2021-07-21 DIAGNOSIS — K2270 Barrett's esophagus without dysplasia: Secondary | ICD-10-CM

## 2021-07-21 DIAGNOSIS — K222 Esophageal obstruction: Secondary | ICD-10-CM | POA: Diagnosis not present

## 2021-07-21 DIAGNOSIS — K449 Diaphragmatic hernia without obstruction or gangrene: Secondary | ICD-10-CM

## 2021-07-21 DIAGNOSIS — E119 Type 2 diabetes mellitus without complications: Secondary | ICD-10-CM | POA: Diagnosis not present

## 2021-07-21 DIAGNOSIS — Z87891 Personal history of nicotine dependence: Secondary | ICD-10-CM | POA: Diagnosis not present

## 2021-07-21 DIAGNOSIS — I2510 Atherosclerotic heart disease of native coronary artery without angina pectoris: Secondary | ICD-10-CM | POA: Insufficient documentation

## 2021-07-21 DIAGNOSIS — R110 Nausea: Secondary | ICD-10-CM

## 2021-07-21 DIAGNOSIS — I252 Old myocardial infarction: Secondary | ICD-10-CM | POA: Insufficient documentation

## 2021-07-21 DIAGNOSIS — K219 Gastro-esophageal reflux disease without esophagitis: Secondary | ICD-10-CM | POA: Diagnosis not present

## 2021-07-21 DIAGNOSIS — K640 First degree hemorrhoids: Secondary | ICD-10-CM | POA: Diagnosis not present

## 2021-07-21 DIAGNOSIS — M069 Rheumatoid arthritis, unspecified: Secondary | ICD-10-CM | POA: Diagnosis not present

## 2021-07-21 DIAGNOSIS — Z7984 Long term (current) use of oral hypoglycemic drugs: Secondary | ICD-10-CM | POA: Insufficient documentation

## 2021-07-21 DIAGNOSIS — Z1211 Encounter for screening for malignant neoplasm of colon: Secondary | ICD-10-CM

## 2021-07-21 DIAGNOSIS — K921 Melena: Secondary | ICD-10-CM

## 2021-07-21 DIAGNOSIS — I200 Unstable angina: Secondary | ICD-10-CM

## 2021-07-21 HISTORY — PX: BIOPSY: SHX5522

## 2021-07-21 HISTORY — PX: ESOPHAGOGASTRODUODENOSCOPY (EGD) WITH PROPOFOL: SHX5813

## 2021-07-21 HISTORY — PX: COLONOSCOPY WITH PROPOFOL: SHX5780

## 2021-07-21 LAB — GLUCOSE, CAPILLARY: Glucose-Capillary: 141 mg/dL — ABNORMAL HIGH (ref 70–99)

## 2021-07-21 SURGERY — COLONOSCOPY WITH PROPOFOL
Anesthesia: General

## 2021-07-21 MED ORDER — PROPOFOL 500 MG/50ML IV EMUL
INTRAVENOUS | Status: DC | PRN
  Administered 2021-07-21: 200 ug/kg/min via INTRAVENOUS

## 2021-07-21 MED ORDER — PROPOFOL 10 MG/ML IV BOLUS
INTRAVENOUS | Status: DC | PRN
  Administered 2021-07-21: 80 mg via INTRAVENOUS

## 2021-07-21 MED ORDER — PROPOFOL 500 MG/50ML IV EMUL
INTRAVENOUS | Status: AC
  Filled 2021-07-21: qty 50

## 2021-07-21 MED ORDER — DEXMEDETOMIDINE HCL IN NACL 80 MCG/20ML IV SOLN
INTRAVENOUS | Status: AC
  Filled 2021-07-21: qty 20

## 2021-07-21 MED ORDER — LACTATED RINGERS IV SOLN
INTRAVENOUS | Status: DC
  Administered 2021-07-21: 1000 mL via INTRAVENOUS

## 2021-07-21 MED ORDER — DEXMEDETOMIDINE (PRECEDEX) IN NS 20 MCG/5ML (4 MCG/ML) IV SYRINGE
PREFILLED_SYRINGE | INTRAVENOUS | Status: DC | PRN
  Administered 2021-07-21: 12 ug via INTRAVENOUS

## 2021-07-21 NOTE — Op Note (Signed)
[facility] Patient Name: Greg Alvarez Procedure Date: 07/21/2021 11:21 AM MRN: [id number] Date of Birth: [birth year] Attending MD: Gennette Pac , MD CSN: [id number] Age: 62 Admit Type: Outpatient Procedure:                Colonoscopy Indications:              Hematochezia Providers:                Gennette Pac, MD, Jannett Celestine, RN, Burke Keels, Technician Referring MD:              Medicines:                Propofol per Anesthesia Complications:            No immediate complications. Estimated Blood Loss:     Estimated blood loss: none. Procedure:                Pre-Anesthesia Assessment:                           - Prior to the procedure, a History and Physical                            was performed, and patient medications and                            allergies were reviewed. The patient's tolerance of                            previous anesthesia was also reviewed. The risks                            and benefits of the procedure and the sedation                            options and risks were discussed with the patient.                            All questions were answered, and informed consent                            was obtained. Prior Anticoagulants: The patient has                            taken no previous anticoagulant or antiplatelet                            agents. ASA Grade Assessment: III - A patient with                            severe systemic disease. After reviewing the risks                            and  benefits, the patient was deemed in                            satisfactory condition to undergo the procedure.                           After obtaining informed consent, the colonoscope                            was passed under direct vision. Throughout the                            procedure, the patient's blood pressure, pulse, and                            oxygen saturations were  monitored continuously. The                            [id number]) scope was introduced through                            the anus and advanced to the the cecum, identified                            by appendiceal orifice and ileocecal valve. The                            colonoscopy was performed without difficulty. The                            patient tolerated the procedure well. The quality                            of the bowel preparation was adequate. Scope In: 11:21:12 AM Scope Out: 11:32:12 AM Scope Withdrawal Time: 0 hours 8 minutes 3 seconds  Total Procedure Duration: 0 hours 11 minutes 0 seconds  Findings:      The perianal and digital rectal examinations were normal.      Non-bleeding internal hemorrhoids were found during retroflexion. The       hemorrhoids were mild, small and Grade I (internal hemorrhoids that do       not prolapse).      The exam was otherwise without abnormality on direct and retroflexion       views. Impression:               - Non-bleeding internal hemorrhoids.                           - The examination was otherwise normal on direct                            and retroflexion views.                           - No specimens collected. Moderate Sedation:      Moderate (conscious) sedation  was personally administered by an       anesthesia professional. The following parameters were monitored: oxygen       saturation, heart rate, blood pressure, respiratory rate, EKG, adequacy       of pulmonary ventilation, and response to care. Recommendation:           - Patient has a contact number available for                            emergencies. The signs and symptoms of potential                            delayed complications were discussed with the                            patient. Return to normal activities tomorrow.                            Written discharge instructions were provided to the                            patient.                            - Resume previous diet.                           - Continue present medications.                           - Repeat colonoscopy in 5 years for screening                            purposes.                           - Return to GI office in 3 months. See EGD report Procedure Code(s):        --- Professional ---                           [phone], Colonoscopy, flexible; diagnostic, including                            collection of specimen(s) by brushing or washing,                            when performed (separate procedure) Diagnosis Code(s):        --- Professional ---                           K64.0, First degree hemorrhoids                           K92.1, Melena (includes Hematochezia) CPT copyright 2019 American Medical Association. All rights reserved. The codes documented in this report are preliminary and upon coder review may  be revised to meet current compliance requirements. [name].  [name], MD [name], MD 07/21/2021 11:45:59 AM This report has been signed electronically. Number of Addenda: 0

## 2021-07-21 NOTE — Anesthesia Preprocedure Evaluation (Addendum)
Anesthesia Evaluation  Patient identified by MRN, date of birth, ID band Patient awake    Reviewed: Allergy & Precautions, NPO status , Patient's Chart, lab work & pertinent test results  Airway Mallampati: II  TM Distance: >3 FB Neck ROM: Full    Dental  (+) Dental Advisory Given, Missing   Pulmonary sleep apnea , former smoker,    Pulmonary exam normal breath sounds clear to auscultation       Cardiovascular hypertension, Pt. on medications + angina + CAD and + Past MI  (-) PND Normal cardiovascular exam Rhythm:Regular Rate:Normal     Neuro/Psych negative neurological ROS  negative psych ROS   GI/Hepatic Neg liver ROS, GERD  Medicated,  Endo/Other  diabetes, Well Controlled, Type 2, Oral Hypoglycemic Agents  Renal/GU negative Renal ROS  negative genitourinary   Musculoskeletal  (+) Arthritis , Osteoarthritis and Rheumatoid disorders,    Abdominal   Peds negative pediatric ROS (+)  Hematology negative hematology ROS (+)   Anesthesia Other Findings   Reproductive/Obstetrics negative OB ROS                            Anesthesia Physical Anesthesia Plan  ASA: 3  Anesthesia Plan: General   Post-op Pain Management: Minimal or no pain anticipated   Induction: Intravenous  PONV Risk Score and Plan: Propofol infusion  Airway Management Planned: Nasal Cannula and Natural Airway  Additional Equipment:   Intra-op Plan:   Post-operative Plan:   Informed Consent: I have reviewed the patients History and Physical, chart, labs and discussed the procedure including the risks, benefits and alternatives for the proposed anesthesia with the patient or authorized representative who has indicated his/her understanding and acceptance.       Plan Discussed with: CRNA and Surgeon  Anesthesia Plan Comments:        Anesthesia Quick Evaluation

## 2021-07-21 NOTE — H&P (Signed)
 @   Primary Care Physician:  Byrd Hesselbach, PA Primary Gastroenterologist:  Dr. Jena Gauss  Pre-Procedure History & Physical: HPI:  Greg Alvarez is a 61 y.o. male here for  surveillance exam short segment Barrett's esophagus.  Episode of rectal bleeding.  Now positive family history of precancerous polyps in patient's sister.  Here for a diagnostic colonoscopy as well.  Past Medical History:  Diagnosis Date   Coronary artery disease    Diabetes mellitus type 2, noninsulin dependent (HCC)    GERD (gastroesophageal reflux disease)    High cholesterol    Hypertension    under control with med., has been on med. x 10 yr.   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   left shoulder, states unable to raise left arm   Myocardial infarction [facility])    Panic attacks    Rheumatoid arthritis (HCC)    Sleep apnea    Tenosynovitis of wrist 03/2013   right   Wears glasses     Past Surgical History:  Procedure Laterality Date   APPENDECTOMY  02/27/1978   BIOPSY  04/11/2018   Procedure: BIOPSY;  Surgeon: Corbin Ade, MD;  [facility];  Service: Endoscopy;;  esophagus   CARDIAC CATHETERIZATION  02/28/2000   1 stent placed in LAD   CARPAL TUNNEL RELEASE Left 07/29/2013   Procedure: LEFT CARPAL TUNNEL RELEASE;  Surgeon: Nicki Reaper, MD;  Location: [facility];  Service: Orthopedics;  Laterality: Left;   COLONOSCOPY N/A 01/12/2014   Procedure: COLONOSCOPY;  Surgeon: Corbin Ade, MD;  Location: [facility];  Service: Endoscopy;  Laterality: N/A;  9:30 AM   ESOPHAGOGASTRODUODENOSCOPY (EGD) WITH PROPOFOL N/A 04/11/2018   Procedure: ESOPHAGOGASTRODUODENOSCOPY (EGD) WITH PROPOFOL;  Surgeon: Corbin Ade, MD;  Location: [facility];  Service: Endoscopy;  Laterality: N/A;  1:15pm   SHOULDER ARTHROSCOPY W/ ROTATOR CUFF REPAIR  05/28/2013   left-DSC   TENDON REPAIR Right    hand   TENOSYNOVECTOMY Right 04/08/2013   Procedure: TENOSYNOVECTOMY/FLEXOR TENDONS  RIGHT WRIST;  Surgeon: Nicki Reaper, MD;  Location: [facility];  Service: Orthopedics;  Laterality: Right;    Prior to Admission medications   Medication Sig Start Date End Date Taking? Authorizing Provider  amLODipine (NORVASC) 5 MG tablet Take 5 mg by mouth daily. 10/30/19  Yes [provider]  aspirin 81 MG chewable tablet Chew 81 mg by mouth daily. 04/25/14  Yes [provider]  atorvastatin (LIPITOR) 40 MG tablet Take 40 mg by mouth daily.   Yes [provider]  citalopram (CELEXA) 40 MG tablet Take 20-40 mg by mouth See admin instructions. 40 mg in the morning, 20 mg at 2pm   Yes [provider]  cyclobenzaprine (FLEXERIL) 10 MG tablet Take 10 mg by mouth 3 (three) times daily as needed for muscle spasms. 04/20/21  Yes [provider]  hydrOXYzine (ATARAX) 25 MG tablet Take 25 mg by mouth 3 (three) times daily as needed for anxiety. 04/05/21  Yes [provider]  losartan (COZAAR) 100 MG tablet Take 100 mg by mouth daily.   Yes [provider]  metFORMIN (GLUCOPHAGE) 1000 MG tablet Take 1,000 mg by mouth 2 (two) times daily. 10/30/19  Yes [provider]  Multiple Vitamin (ONE-A-DAY MENS PO) Take 1 tablet by mouth daily.    Yes [provider]  Omega-3 Fatty Acids (FISH OIL) 1000 MG CAPS Take 1,000 mg by mouth 2 (two) times daily.  Yes [provider]  pantoprazole (PROTONIX) 40 MG tablet Take 40 mg by mouth daily. 10/30/19  Yes [provider]  polyethylene glycol-electrolytes (NULYTELY) 420 g solution As directed 06/01/21  Yes Dervin Vore, Gerrit Friendsobert M, MD  Semaglutide (OZEMPIC, 0.25 OR 0.5 MG/DOSE, [place]) Inject 0.5 mg into the skin every Friday.   Yes [provider]  traZODone (DESYREL) 100 MG tablet Take 100 mg by mouth at bedtime.   Yes [provider]    Allergies as of 06/01/2021 - Review Complete 05/27/2021  Allergen Reaction Noted   Ativan [lorazepam] Other (See  Comments) 04/04/2013    Family History  Problem Relation Age of Onset   Anesthesia problems Sister        hard to wake up post-op   Parkinson's disease Sister    Multiple sclerosis Sister    Epilepsy Mother    Heart disease Father    Parkinson's disease Father    Heart failure Father    Heart attack Father    Heart disease Brother    Heart attack Brother    Stroke Brother    Colon cancer Neg Hx    Gastric cancer Neg Hx    Esophageal cancer Neg Hx     Social History   Socioeconomic History   Marital status: Divorced    Spouse name: Not on file   Number of children: Not on file   Years of education: Not on file   Highest education level: Not on file  Occupational History   Not on file  Tobacco Use   Smoking status: Former    Types: Cigarettes    Quit date: 03/13/1998    Years since quitting: 23.3   Smokeless tobacco: Never  Vaping Use   Vaping Use: Never used  Substance and Sexual Activity   Alcohol use: Not Currently    Comment: once or twice a year   Drug use: No   Sexual activity: Yes    Partners: Female  Other Topics Concern   Not on file  Social History Narrative   Not on file   Social Determinants of Health   Financial Resource Strain: Not on file  Food Insecurity: Not on file  Transportation Needs: Not on file  Physical Activity: Not on file  Stress: Not on file  Social Connections: Not on file  Intimate Partner Violence: Not on file    Review of Systems: See HPI, otherwise negative ROS  Physical Exam: BP 117/81   Pulse 79   Temp 98.3 F (36.8 C) (Oral)   Resp 16   Ht 5\' 6"  (1.676 m)   Wt 106.6 kg   SpO2 94%   BMI 37.93 kg/m  General:   Alert,  Well-developed, well-nourished, pleasant and cooperative in NAD Neck:  Supple; no masses or thyromegaly. No significant cervical adenopathy. Lungs:  Clear throughout to auscultation.   No wheezes, crackles, or rhonchi. No acute distress. Heart:  Regular rate and rhythm; no murmurs, clicks,  rubs,  or gallops. Abdomen: Non-distended, normal bowel sounds.  Soft and nontender without appreciable mass or hepatosplenomegaly.  Pulses:  Normal pulses noted. Extremities:  Without clubbing or edema.  Impression/Plan:    61 year old  gentleman with known short segment Barrett's esophagus here for surveillance EGD.  Also, single episode of rectal bleeding.  Colonoscopy in the distant past.  Now family history of precancerous polyps in first-degree relative  Denies dysphagia   I have offered the patient a surveillance EGD and diagnostic colonoscopy today per plan.  The risks, benefits, limitations, imponderables and alternatives regarding both EGD and colonoscopy have been reviewed with the patient. Questions have been answered. All parties agreeable.       Notice: This dictation was prepared with Dragon dictation along with smaller phrase technology. Any transcriptional errors that result from this process are unintentional and may not be corrected upon review.

## 2021-07-21 NOTE — Transfer of Care (Signed)
Immediate Anesthesia Transfer of Care Note  Patient: Greg Alvarez  Procedure(s) Performed: COLONOSCOPY WITH PROPOFOL ESOPHAGOGASTRODUODENOSCOPY (EGD) WITH PROPOFOL BIOPSY  Patient Location: PACU  Anesthesia Type:General  Level of Consciousness: awake, alert  and oriented  Airway & Oxygen Therapy: Patient Spontanous Breathing  Post-op Assessment: Report given to RN, Post -op Vital signs reviewed and stable, Patient moving all extremities X 4 and Patient able to stick tongue midline  Post vital signs: Reviewed  Last Vitals:  Vitals Value Taken Time  BP 85/51   Temp 97.8   Pulse 62   Resp 7   SpO2 100     Last Pain:  Vitals:   07/21/21 1006  TempSrc: Oral  PainSc: 0-No pain      Patients Stated Pain Goal: 8 ([id number])  Complications: No notable events documented.

## 2021-07-21 NOTE — Anesthesia Postprocedure Evaluation (Signed)
Anesthesia Post Note  Patient: Greg Alvarez  Procedure(s) Performed: COLONOSCOPY WITH PROPOFOL ESOPHAGOGASTRODUODENOSCOPY (EGD) WITH PROPOFOL BIOPSY  Patient location during evaluation: Phase II Anesthesia Type: General Level of consciousness: awake and alert and oriented Pain management: pain level controlled Vital Signs Assessment: post-procedure vital signs reviewed and stable Respiratory status: spontaneous breathing, nonlabored ventilation and respiratory function stable Cardiovascular status: blood pressure returned to baseline and stable Postop Assessment: no apparent nausea or vomiting Anesthetic complications: no   No notable events documented.   Last Vitals:  Vitals:   07/21/21 1137 07/21/21 1218  BP: (!) 85/51 114/68  Pulse: 63   Resp: (!) 22   Temp: (!) 36.3 C   SpO2:  98%    Last Pain:  Vitals:   07/21/21 1137  TempSrc: Axillary  PainSc: 0-No pain                 Perina Salvaggio C Pilar Corrales

## 2021-07-21 NOTE — Op Note (Signed)
[facility] Patient Name: Greg Alvarez Procedure Date: 07/21/2021 10:43 AM MRN: [id number] Date of Birth: [birth year] Attending MD: Norvel Richards , MD CSN: [id number] Age: 61 Admit Type: Outpatient Procedure:                Upper GI endoscopy Indications:              Surveillance for malignancy due to personal history                            of Barrett's esophagus Providers:                Norvel Richards, MD, Janeece Riggers, RN, Randa Spike, Technician Referring MD:              Medicines:                Propofol per Anesthesia Complications:            No immediate complications. Estimated Blood Loss:     Estimated blood loss was minimal. Procedure:                Pre-Anesthesia Assessment:                           - Prior to the procedure, a History and Physical                            was performed, and patient medications and                            allergies were reviewed. The patient's tolerance of                            previous anesthesia was also reviewed. The risks                            and benefits of the procedure and the sedation                            options and risks were discussed with the patient.                            All questions were answered, and informed consent                            was obtained. Prior Anticoagulants: The patient has                            taken no previous anticoagulant or antiplatelet                            agents. ASA Grade Assessment: III - A patient with  severe systemic disease. After reviewing the risks                            and benefits, the patient was deemed in                            satisfactory condition to undergo the procedure.                           After obtaining informed consent, the endoscope was                            passed under direct vision. Throughout the                            procedure,  the patient's blood pressure, pulse, and                            oxygen saturations were monitored continuously. The                            [id number] scope was introduced through the                            mouth, and advanced to the second part of duodenum.                            The upper GI endoscopy was accomplished without                            difficulty. The patient tolerated the procedure                            well. Scope In: 11:10:56 AM Scope Out: 11:15:22 AM Total Procedure Duration: 0 hours 4 minutes 26 seconds  Findings:      A mild Schatzki ring was found at the gastroesophageal junction.      A small hiatal hernia was present. Gastric cavity appeared empty.       Gastric mucosa appeared normal. Patent pylorus.      The duodenal bulb and second portion of the duodenum were normal. There       were 2 "tongues" of salmon-colored epithelium appear to be coming up no       more than 5 mm from the GE junction. No nodularity. No esophagitis. This       was biopsied with a cold forceps for histology. Estimated blood loss was       minimal. Impression:               - Mild Schatzki ring. Abnormal esophageal                            mucosa?"biopsied.                           - Small hiatal hernia; otherwise normal stomach                           -  Normal duodenal bulb and second portion of the                            duodenum. Moderate Sedation:      Moderate (conscious) sedation was personally administered by an       anesthesia professional. The following parameters were monitored: oxygen       saturation, heart rate, blood pressure, respiratory rate, EKG, adequacy       of pulmonary ventilation, and response to care. Recommendation:           - Patient has a contact number available for                            emergencies. The signs and symptoms of potential                            delayed complications were discussed with the                             patient. Return to normal activities tomorrow.                            Written discharge instructions were provided to the                            patient.                           - Advance diet as tolerated.                           - Continue present medications.                           - Return to my office in 6 months. Procedure Code(s):        --- Professional ---                           [phone], Esophagogastroduodenoscopy, flexible,                            transoral; with biopsy, single or multiple Diagnosis Code(s):        --- Professional ---                           K22.2, Esophageal obstruction                           K44.9, Diaphragmatic hernia without obstruction or                            gangrene                           K22.70, Barrett's esophagus without dysplasia CPT copyright 2019 [employer] Medical Association. All rights reserved. The codes documented in this report are preliminary and upon coder review may  be  revised to meet current compliance requirements. Cristopher Estimable. Rahmah Mccamy, MD Norvel Richards, MD 07/21/2021 11:34:18 AM This report has been signed electronically. Number of Addenda: 0

## 2021-07-21 NOTE — Discharge Instructions (Addendum)
Colonoscopy Discharge Instructions  Read the instructions outlined below and refer to this sheet in the next few weeks. These discharge instructions provide you with general information on caring for yourself after you leave the hospital. Your doctor may also give you specific instructions. While your treatment has been planned according to the most current medical practices available, unavoidable complications occasionally occur. If you have any problems or questions after discharge, call Dr. Gala Romney at [phone]. ACTIVITY You may resume your regular activity, but move at a slower pace for the next 24 hours.  Take frequent rest periods for the next 24 hours.  Walking will help get rid of the air and reduce the bloated feeling in your belly (abdomen).  No driving for 24 hours (because of the medicine (anesthesia) used during the test).   Do not sign any important legal documents or operate any machinery for 24 hours (because of the anesthesia used during the test).  NUTRITION Drink plenty of fluids.  You may resume your normal diet as instructed by your doctor.  Begin with a light meal and progress to your normal diet. Heavy or fried foods are harder to digest and may make you feel sick to your stomach (nauseated).  Avoid alcoholic beverages for 24 hours or as instructed.  MEDICATIONS You may resume your normal medications unless your doctor tells you otherwise.  WHAT YOU CAN EXPECT TODAY Some feelings of bloating in the abdomen.  Passage of more gas than usual.  Spotting of blood in your stool or on the toilet paper.  IF YOU HAD POLYPS REMOVED DURING THE COLONOSCOPY: No aspirin products for 7 days or as instructed.  No alcohol for 7 days or as instructed.  Eat a soft diet for the next 24 hours.  FINDING OUT THE RESULTS OF YOUR TEST Not all test results are available during your visit. If your test results are not back during the visit, make an appointment with your caregiver to find out the  results. Do not assume everything is normal if you have not heard from your caregiver or the medical facility. It is important for you to follow up on all of your test results.  SEEK IMMEDIATE MEDICAL ATTENTION IF: You have more than a spotting of blood in your stool.  Your belly is swollen (abdominal distention).  You are nauseated or vomiting.  You have a temperature over 101.  You have abdominal pain or discomfort that is severe or gets worse throughout the day.   EGD Discharge instructions Please read the instructions outlined below and refer to this sheet in the next few weeks. These discharge instructions provide you with general information on caring for yourself after you leave the hospital. Your doctor may also give you specific instructions. While your treatment has been planned according to the most current medical practices available, unavoidable complications occasionally occur. If you have any problems or questions after discharge, please call your doctor. ACTIVITY You may resume your regular activity but move at a slower pace for the next 24 hours.  Take frequent rest periods for the next 24 hours.  Walking will help expel (get rid of) the air and reduce the bloated feeling in your abdomen.  No driving for 24 hours (because of the anesthesia (medicine) used during the test).  You may shower.  Do not sign any important legal documents or operate any machinery for 24 hours (because of the anesthesia used during the test).  NUTRITION Drink plenty of fluids.  You may  resume your normal diet.  Begin with a light meal and progress to your normal diet.  Avoid alcoholic beverages for 24 hours or as instructed by your caregiver.  MEDICATIONS You may resume your normal medications unless your caregiver tells you otherwise.  WHAT YOU CAN EXPECT TODAY You may experience abdominal discomfort such as a feeling of fullness or "gas" pains.  FOLLOW-UP Your doctor will discuss the results of  your test with you.  SEEK IMMEDIATE MEDICAL ATTENTION IF ANY OF THE FOLLOWING OCCUR: Excessive nausea (feeling sick to your stomach) and/or vomiting.  Severe abdominal pain and distention (swelling).  Trouble swallowing.  Temperature over 101 F (37.8 C).  Rectal bleeding or vomiting of blood.     Your esophagus was biopsied today  No polyp was found in your colon.  You do have some mild hemorrhoids   I recommend repeat colonoscopy in 5 years given sisters history   office visit with [place] in 3 months  Further recommendations to follow pending review of pathology report   at patient request, I called Mariann Laster at [phone] -  rolled to voicemail.  Left a message.

## 2021-07-22 ENCOUNTER — Encounter: Payer: Self-pay | Admitting: Internal Medicine

## 2021-07-22 LAB — SURGICAL PATHOLOGY

## 2021-07-28 ENCOUNTER — Encounter (HOSPITAL_COMMUNITY): Payer: Self-pay | Admitting: Internal Medicine

## 2021-08-04 ENCOUNTER — Ambulatory Visit (INDEPENDENT_AMBULATORY_CARE_PROVIDER_SITE_OTHER): Payer: Medicare HMO | Admitting: Cardiology

## 2021-08-04 ENCOUNTER — Other Ambulatory Visit: Payer: Self-pay | Admitting: Cardiology

## 2021-08-04 ENCOUNTER — Encounter: Payer: Self-pay | Admitting: Cardiology

## 2021-08-04 VITALS — BP 135/80 | HR 70 | Ht 66.0 in | Wt 227.6 lb

## 2021-08-04 DIAGNOSIS — E119 Type 2 diabetes mellitus without complications: Secondary | ICD-10-CM

## 2021-08-04 DIAGNOSIS — I200 Unstable angina: Secondary | ICD-10-CM

## 2021-08-04 DIAGNOSIS — I10 Essential (primary) hypertension: Secondary | ICD-10-CM

## 2021-08-04 DIAGNOSIS — I25110 Atherosclerotic heart disease of native coronary artery with unstable angina pectoris: Secondary | ICD-10-CM | POA: Diagnosis not present

## 2021-08-04 DIAGNOSIS — E7800 Pure hypercholesterolemia, unspecified: Secondary | ICD-10-CM | POA: Diagnosis not present

## 2021-08-04 LAB — COMPREHENSIVE METABOLIC PANEL
ALT: 63 IU/L — ABNORMAL HIGH (ref 0–44)
AST: 46 IU/L — ABNORMAL HIGH (ref 0–40)
Albumin/Globulin Ratio: 2.1 (ref 1.2–2.2)
Albumin: 4.9 g/dL (ref 3.8–4.9)
Alkaline Phosphatase: 66 IU/L (ref 44–121)
BUN/Creatinine Ratio: 14 (ref 9–20)
BUN: 13 mg/dL (ref 6–24)
Bilirubin Total: 0.6 mg/dL (ref 0.0–1.2)
CO2: 28 mmol/L (ref 20–29)
Calcium: 10 mg/dL (ref 8.7–10.2)
Chloride: 102 mmol/L (ref 96–106)
Creatinine, Ser: 0.93 mg/dL (ref 0.76–1.27)
Globulin, Total: 2.3 g/dL (ref 1.5–4.5)
Glucose: 115 mg/dL — ABNORMAL HIGH (ref 70–99)
Potassium: 4.6 mmol/L (ref 3.5–5.2)
Sodium: 141 mmol/L (ref 134–144)
Total Protein: 7.2 g/dL (ref 6.0–8.5)
eGFR: 95 mL/min/{1.73_m2} (ref >59)

## 2021-08-04 LAB — CBC
Hematocrit: 37.8 % (ref 37.5–51.0)
Hemoglobin: 13.6 g/dL (ref 13.0–17.7)
MCH: 32.6 pg (ref 26.6–33.0)
MCHC: 36 g/dL — ABNORMAL HIGH (ref 31.5–35.7)
MCV: 91 fL (ref 79–97)
Platelets: 272 10*3/uL (ref 150–450)
RBC: 4.17 x10E6/uL (ref 4.14–5.80)
RDW: 12.8 % (ref 11.6–15.4)
WBC: 7.9 10*3/uL (ref 3.4–10.8)

## 2021-08-04 LAB — LIPID PANEL
Chol/HDL Ratio: 4.3 ratio (ref 0.0–5.0)
Cholesterol, Total: 154 mg/dL (ref 100–199)
HDL: 36 mg/dL — ABNORMAL LOW (ref >39)
LDL Chol Calc (NIH): 92 mg/dL (ref 0–99)
Triglycerides: 148 mg/dL (ref 0–149)
VLDL Cholesterol Cal: 26 mg/dL (ref 5–40)

## 2021-08-04 MED ORDER — SODIUM CHLORIDE 0.9% FLUSH: 3.0000 mL | Freq: Two times a day (BID) | INTRAVENOUS | Status: DC

## 2021-08-04 NOTE — Progress Notes (Signed)
  Cardiology Office Note    Date:  08/04/2021   ID:  Tarrence R Beed, DOB 10/04/1960, MRN [id number]  PCP:  Ankabrandt, Emily, PA  Cardiologist:  Tenise Stetler, MD   Chief Complaint  Patient presents with   Chest Pain   Shortness of Breath       History of Present Illness: Yaniel R Muldoon is a 60 y.o. male who is seen for evaluation of chest pain and DOE. He has a history of HTN, HLD, OSA and DM. He presented in 7/18 to [facility] for evaluation of an episode of dizziness, shortness of breath, chest tightness, and aphasia. His troponin was negative and a stress echocardiogram was ordered. His stress test was positive for inducible ischemia. EF was normal at rest. Patient had a left heart cath on 09/14/2016. Cath was significant for 75% prox ramus, 70% RPDA, 60% RPL and 75% mCx (small). Culprit lesion was a 90% prox/mid LAD lesion beginning at 1st septal and spans into mid segment. A 2.75 x 28 mm Promus stent was placed in the LAD and post dilated to 3.25 mm.  I personally reviewed cardiac cath films from [facility] on 09/14/16 and concur with the findings noted above.  He had a Myoview 03/15/18 and this showed normal perfusion and normal EF.   He does have a history of OSA. Was told in the past it was severe. He was on CPAP with improvement in his symptoms but states he hasn't been able to tolerate CPAP or nasal Bipap due to his panic disorder.  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   [facility].   Over the past 2 months he has noticed symptoms of DOE, dizziness and chest tightness. Has been under increased stress since January. Mother passed. Sister had a heart attack and 6 stents. Another sister has parkinsons. He is primary caregiver. He recently underwent pan endoscopy which was unremarkable. It was noted at the time that his Ecg was abnormal and referred here.    Past Medical History:  Diagnosis Date   Coronary artery disease    Diabetes  mellitus type 2, noninsulin dependent (HCC)    GERD (gastroesophageal reflux disease)    High cholesterol    Hypertension    under control with med., has been on med. x 10 yr.   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   left shoulder, states unable to raise left arm   Myocardial infarction (HCC)    Panic attacks    Rheumatoid arthritis (HCC)    Sleep apnea    Tenosynovitis of wrist 03/2013   right   Wears glasses     Past Surgical History:  Procedure Laterality Date   APPENDECTOMY  02/27/1978   BIOPSY  04/11/2018   Procedure: BIOPSY;  Surgeon: Rourk, Robert M, MD;  [facility];  Service: Endoscopy;;  esophagus   BIOPSY  07/21/2021   Procedure: BIOPSY;  Surgeon: Rourk, Robert M, MD;  [facility];  Service: Endoscopy;;   CARDIAC CATHETERIZATION  02/28/2000   1 stent placed in LAD   CARPAL TUNNEL RELEASE Left 07/29/2013   Procedure: LEFT CARPAL TUNNEL RELEASE;  Surgeon: Gary R Kuzma, MD;  Location: [facility];  Service: Orthopedics;  Laterality: Left;   COLONOSCOPY N/A 01/12/2014   Procedure: COLONOSCOPY;  Surgeon: Robert M Rourk, MD;  [facility];  Service: Endoscopy;  Laterality: N/A;  9:30 AM     COLONOSCOPY WITH PROPOFOL N/A 07/21/2021   Procedure: COLONOSCOPY WITH PROPOFOL;  Surgeon: Daneil Dolin, MD;  [facility];  Service: Endoscopy;  Laterality: N/A;  10:30am   ESOPHAGOGASTRODUODENOSCOPY (EGD) WITH PROPOFOL N/A 04/11/2018   Procedure: ESOPHAGOGASTRODUODENOSCOPY (EGD) WITH PROPOFOL;  Surgeon: Daneil Dolin, MD;  [facility];  Service: Endoscopy;  Laterality: N/A;  1:15pm   ESOPHAGOGASTRODUODENOSCOPY (EGD) WITH PROPOFOL N/A 07/21/2021   Procedure: ESOPHAGOGASTRODUODENOSCOPY (EGD) WITH PROPOFOL;  Surgeon: Daneil Dolin, MD;  [facility];  Service: Endoscopy;  Laterality: N/A;   SHOULDER ARTHROSCOPY W/ ROTATOR CUFF REPAIR  05/28/2013   left-DSC   TENDON REPAIR Right    hand    TENOSYNOVECTOMY Right 04/08/2013   Procedure: TENOSYNOVECTOMY/FLEXOR TENDONS RIGHT WRIST;  Surgeon: Wynonia Sours, MD;  Location: [place];  Service: Orthopedics;  Laterality: Right;     Current Outpatient Medications  Medication Sig Dispense Refill   amLODipine (NORVASC) 5 MG tablet Take 5 mg by mouth daily.     aspirin 81 MG chewable tablet Chew 81 mg by mouth daily.     atorvastatin (LIPITOR) 40 MG tablet Take 40 mg by mouth daily.     citalopram (CELEXA) 40 MG tablet Take 20-40 mg by mouth See admin instructions. 40 mg in the morning, 20 mg at 2pm     cyclobenzaprine (FLEXERIL) 10 MG tablet Take 10 mg by mouth 3 (three) times daily as needed for muscle spasms.     hydrOXYzine (ATARAX) 25 MG tablet Take 25 mg by mouth 3 (three) times daily as needed for anxiety.     losartan (COZAAR) 100 MG tablet Take 100 mg by mouth daily.     metFORMIN (GLUCOPHAGE) 1000 MG tablet Take 1,000 mg by mouth 2 (two) times daily.     Multiple Vitamin (ONE-A-DAY MENS PO) Take 1 tablet by mouth daily.      Omega-3 Fatty Acids (FISH OIL) 1000 MG CAPS Take 1,000 mg by mouth 2 (two) times daily.     OZEMPIC, 0.25 OR 0.5 MG/DOSE, 2 MG/3ML SOPN Inject into the skin.     pantoprazole (PROTONIX) 40 MG tablet Take 40 mg by mouth daily.     traZODone (DESYREL) 100 MG tablet Take 100 mg by mouth at bedtime.     No current facility-administered medications for this visit.    Allergies:   Ativan [lorazepam]    Social History:  The patient  reports that he quit smoking about 23 years ago. His smoking use included cigarettes. He has never used smokeless tobacco. He reports that he does not currently use alcohol. He reports that he does not use drugs.   Family History:  The patient's family history includes Anesthesia problems in his sister; Epilepsy in his mother; Heart attack in his brother and father; Heart disease in his brother and father; Heart failure in his father; Multiple sclerosis in his sister;  Parkinson's disease in his father and sister; Stroke in his brother.    ROS:  Please see the history of present illness.   Otherwise, review of systems are positive for .   All other systems are reviewed and negative.    PHYSICAL EXAM: VS:  BP 135/80   Pulse 70   Ht 5\' 6"  (1.676 m)   Wt 227 lb 9.6 oz (103.2 kg)   SpO2 95%   BMI 36.74 kg/m  , BMI Body mass index is 36.74 kg/m. GENERAL:  Well appearing WM in NAD HEENT:  PERRL,  EOMI, sclera are clear. Oropharynx is clear. NECK:  No jugular venous distention, carotid upstroke brisk and symmetric, no bruits, no thyromegaly or adenopathy LUNGS:  Clear to auscultation bilaterally CHEST:  Unremarkable HEART:  RRR,  PMI not displaced or sustained,S1 and S2 within normal limits, no S3, no S4: no clicks, no rubs, no murmurs ABD:  Soft, nontender. BS +, no masses or bruits. No hepatomegaly, no splenomegaly EXT:  2 + pulses throughout, no edema, no cyanosis no clubbing SKIN:  Warm and dry.  No rashes NEURO:  Alert and oriented x 3. Cranial nerves II through XII intact. PSYCH:  Cognitively intact  EKG:  EKG done 07/21/21 showed ectopic atrial rhythm rate 82. New T wave inversion inferiorly. Otherwise normal. I have personally reviewed and interpreted this study.     Recent Labs: 11/29/2020: ALT 21; Hemoglobin 13.5; Platelets 367 07/18/2021: BUN 10; Creatinine, Ser 0.80; Potassium 4.1; Sodium 139    Lipid Panel    Component Value Date/Time   CHOL 140 03/15/2018 1223   TRIG 204 (H) 03/15/2018 1223   HDL 33 (L) 03/15/2018 1223   CHOLHDL 4.2 03/15/2018 1223   CHOLHDL 8.3 03/11/2008 0540   VLDL 50 (H) 03/11/2008 0540   LDLCALC 66 03/15/2018 1223   Labs dated 12/17/13: WBC 10.6. platelets 416K. Hgb 14.3. Cholesterol 295, triglycerides 281, HDL 35, LDL 204. Aic 6.4%. creatinine normal.     Labs dated 09/15/16: glucose 160. Other chemistries normal. Cholesterol 150, triglycerides 314, HDL 27, LDL 86. Hgb 13.5.   Labs from [place] one year ago:  cholesterol 102, triglycerides 88, HDL 32, LDL 52.   Wt Readings from Last 3 Encounters:  08/04/21 227 lb 9.6 oz (103.2 kg)  07/21/21 235 lb 0.2 oz (106.6 kg)  07/18/21 235 lb (106.6 kg)      Other studies Reviewed: Additional studies/ records that were reviewed today include:   Myoview 03/15/18: Study Highlights   Nuclear stress EF: 56%. The left ventricular ejection fraction is normal (55-65%). There was no ST segment deviation noted during stress. This is a low risk study. No evidence of ischemia or previous infarction The study is normal.      ASSESSMENT AND PLAN:  1.  CAD s/p NSTEMI in July 2018. Had DES of LAD. Moderate residual disease in multiple branches. EF normal. On ASA. He did experience chest pain in November 2019 related to stress. Follow up Myoview was normal. Now with symptoms of DOE and chest tightness concerning for progressive angina. Continue ASA, amlodipine, statin. I have recommended he undergo cardiac cath to assess his CAD. The procedure and risks were reviewed including but not limited to death, myocardial infarction, stroke, arrythmias, bleeding, transfusion, emergency surgery, dye allergy, or renal dysfunction. The patient voices understanding and is agreeable to proceed.   2. DM type 2 not on insulin. Per primary care.   3. Hypercholesterolemia. On lipitor 40 mg daily. Goal LDL < 70. Will update labs today.  4. HTN controlled.  5. Obesity with OSA. Intolerant of mask due to panic attacks- [facility]   6. RA on methotrexate   Current medicines are reviewed at length with the patient today.  The patient does not have concerns regarding medicines.  The following changes have been made:  no change  Labs/ tests ordered today include:   Orders Placed This Encounter  Procedures   Comprehensive metabolic panel   CBC   Lipid panel      Disposition:   Cardiac cath next week.  Signed, Sundiata Ferrick [place], MD  08/04/2021 11:25 AM    [facility] [address] [place], [place], [address] Phone [phone], Fax [phone]

## 2021-08-04 NOTE — H&P (View-Only) (Signed)
Cardiology Office Note    Date:  08/04/2021   ID:  Greg Alvarez, DOB Dec 23, 1960, MRN [id number]  PCP:  [place], [place]  Cardiologist:  Reola Buckles [place], MD   Chief Complaint  Patient presents with   Chest Pain   Shortness of Breath       History of Present Illness: Greg Alvarez is a 61 y.o. male who is seen for evaluation of chest pain and DOE. He has a history of HTN, HLD, OSA and DM. He presented in 7/18 to [facility] for evaluation of an episode of dizziness, shortness of breath, chest tightness, and aphasia. His troponin was negative and a stress echocardiogram was ordered. His stress test was positive for inducible ischemia. EF was normal at rest. Patient had a left heart cath on 09/14/2016. Cath was significant for 75% prox ramus, 70% RPDA, 60% RPL and 75% mCx (small). Culprit lesion was a 90% prox/mid LAD lesion beginning at 1st septal and spans into mid segment. A 2.75 x 28 mm Promus stent was placed in the LAD and post dilated to 3.25 mm.  I personally reviewed cardiac cath films from [facility] on 09/14/16 and concur with the findings noted above.  He had a Myoview 03/15/18 and this showed normal perfusion and normal EF.   He does have a history of OSA. Was told in the past it was severe. He was on CPAP with improvement in his symptoms but states he hasn't been able to tolerate CPAP or nasal Bipap due to his panic disorder.  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   He is followed at the [facility] and by [facility].   Over the past 2 months he has noticed symptoms of DOE, dizziness and chest tightness. Has been under increased stress since January. Mother passed. Sister had a heart attack and 6 stents. Another sister has parkinsons. He is primary caregiver. He recently underwent pan endoscopy which was unremarkable. It was noted at the time that his Ecg was abnormal and referred here.    Past Medical History:  Diagnosis Date   Coronary artery disease    Diabetes  mellitus type 2, noninsulin dependent (HCC)    GERD (gastroesophageal reflux disease)    High cholesterol    Hypertension    under control with med., has been on med. x 10 yr.   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   left shoulder, states unable to raise left arm   Myocardial infarction [facility])    Panic attacks    Rheumatoid arthritis ([place])    Sleep apnea    Tenosynovitis of wrist 03/2013   right   Wears glasses     Past Surgical History:  Procedure Laterality Date   APPENDECTOMY  02/27/1978   BIOPSY  04/11/2018   Procedure: BIOPSY;  Surgeon: Daneil Dolin, MD;  [facility];  Service: Endoscopy;;  esophagus   BIOPSY  07/21/2021   Procedure: BIOPSY;  Surgeon: Daneil Dolin, MD;  [facility];  Service: Endoscopy;;   CARDIAC CATHETERIZATION  02/28/2000   1 stent placed in LAD   CARPAL TUNNEL RELEASE Left 07/29/2013   Procedure: LEFT CARPAL [place];  Surgeon: Wynonia Sours, MD;  Location: [place];  Service: Orthopedics;  Laterality: Left;   COLONOSCOPY N/A 01/12/2014   Procedure: COLONOSCOPY;  Surgeon: Daneil Dolin, MD;  [facility];  Service: Endoscopy;  Laterality: N/A;  9:30 AM  COLONOSCOPY WITH PROPOFOL N/A 07/21/2021   Procedure: COLONOSCOPY WITH PROPOFOL;  Surgeon: Daneil Dolin, MD;  [facility];  Service: Endoscopy;  Laterality: N/A;  10:30am   ESOPHAGOGASTRODUODENOSCOPY (EGD) WITH PROPOFOL N/A 04/11/2018   Procedure: ESOPHAGOGASTRODUODENOSCOPY (EGD) WITH PROPOFOL;  Surgeon: Daneil Dolin, MD;  [facility];  Service: Endoscopy;  Laterality: N/A;  1:15pm   ESOPHAGOGASTRODUODENOSCOPY (EGD) WITH PROPOFOL N/A 07/21/2021   Procedure: ESOPHAGOGASTRODUODENOSCOPY (EGD) WITH PROPOFOL;  Surgeon: Daneil Dolin, MD;  [facility];  Service: Endoscopy;  Laterality: N/A;   SHOULDER ARTHROSCOPY W/ ROTATOR CUFF REPAIR  05/28/2013   left-DSC   TENDON REPAIR Right    hand    TENOSYNOVECTOMY Right 04/08/2013   Procedure: TENOSYNOVECTOMY/FLEXOR TENDONS RIGHT WRIST;  Surgeon: Wynonia Sours, MD;  Location: [place];  Service: Orthopedics;  Laterality: Right;     Current Outpatient Medications  Medication Sig Dispense Refill   amLODipine (NORVASC) 5 MG tablet Take 5 mg by mouth daily.     aspirin 81 MG chewable tablet Chew 81 mg by mouth daily.     atorvastatin (LIPITOR) 40 MG tablet Take 40 mg by mouth daily.     citalopram (CELEXA) 40 MG tablet Take 20-40 mg by mouth See admin instructions. 40 mg in the morning, 20 mg at 2pm     cyclobenzaprine (FLEXERIL) 10 MG tablet Take 10 mg by mouth 3 (three) times daily as needed for muscle spasms.     hydrOXYzine (ATARAX) 25 MG tablet Take 25 mg by mouth 3 (three) times daily as needed for anxiety.     losartan (COZAAR) 100 MG tablet Take 100 mg by mouth daily.     metFORMIN (GLUCOPHAGE) 1000 MG tablet Take 1,000 mg by mouth 2 (two) times daily.     Multiple Vitamin (ONE-A-DAY MENS PO) Take 1 tablet by mouth daily.      Omega-3 Fatty Acids (FISH OIL) 1000 MG CAPS Take 1,000 mg by mouth 2 (two) times daily.     OZEMPIC, 0.25 OR 0.5 MG/DOSE, 2 MG/3ML SOPN Inject into the skin.     pantoprazole (PROTONIX) 40 MG tablet Take 40 mg by mouth daily.     traZODone (DESYREL) 100 MG tablet Take 100 mg by mouth at bedtime.     No current facility-administered medications for this visit.    Allergies:   Ativan [lorazepam]    Social History:  The patient  reports that he quit smoking about 23 years ago. His smoking use included cigarettes. He has never used smokeless tobacco. He reports that he does not currently use alcohol. He reports that he does not use drugs.   Family History:  The patient's family history includes Anesthesia problems in his sister; Epilepsy in his mother; Heart attack in his brother and father; Heart disease in his brother and father; Heart failure in his father; Multiple sclerosis in his sister;  Parkinson's disease in his father and sister; Stroke in his brother.    ROS:  Please see the history of present illness.   Otherwise, review of systems are positive for .   All other systems are reviewed and negative.    PHYSICAL EXAM: VS:  BP 135/80   Pulse 70   Ht 5\' 6"  (1.676 m)   Wt 227 lb 9.6 oz (103.2 kg)   SpO2 95%   BMI 36.74 kg/m  , BMI Body mass index is 36.74 kg/m. GENERAL:  Well appearing WM in NAD HEENT:  PERRL,  EOMI, sclera are clear. Oropharynx is clear. NECK:  No jugular venous distention, carotid upstroke brisk and symmetric, no bruits, no thyromegaly or adenopathy LUNGS:  Clear to auscultation bilaterally CHEST:  Unremarkable HEART:  RRR,  PMI not displaced or sustained,S1 and S2 within normal limits, no S3, no S4: no clicks, no rubs, no murmurs ABD:  Soft, nontender. BS +, no masses or bruits. No hepatomegaly, no splenomegaly EXT:  2 + pulses throughout, no edema, no cyanosis no clubbing SKIN:  Warm and dry.  No rashes NEURO:  Alert and oriented x 3. Cranial nerves II through XII intact. PSYCH:  Cognitively intact  EKG:  EKG done 07/21/21 showed ectopic atrial rhythm rate 82. New T wave inversion inferiorly. Otherwise normal. I have personally reviewed and interpreted this study.     Recent Labs: 11/29/2020: ALT 21; Hemoglobin 13.5; Platelets 367 07/18/2021: BUN 10; Creatinine, Ser 0.80; Potassium 4.1; Sodium 139    Lipid Panel    Component Value Date/Time   CHOL 140 03/15/2018 1223   TRIG 204 (H) 03/15/2018 1223   HDL 33 (L) 03/15/2018 1223   CHOLHDL 4.2 03/15/2018 1223   CHOLHDL 8.3 03/11/2008 0540   VLDL 50 (H) 03/11/2008 0540   LDLCALC 66 03/15/2018 1223   Labs dated 12/17/13: WBC 10.6. platelets 416K. Hgb 14.3. Cholesterol 295, triglycerides 281, HDL 35, LDL 204. Aic 6.4%. creatinine normal.     Labs dated 09/15/16: glucose 160. Other chemistries normal. Cholesterol 150, triglycerides 314, HDL 27, LDL 86. Hgb 13.5.   Labs from [place] one year ago:  cholesterol 102, triglycerides 88, HDL 32, LDL 52.   Wt Readings from Last 3 Encounters:  08/04/21 227 lb 9.6 oz (103.2 kg)  07/21/21 235 lb 0.2 oz (106.6 kg)  07/18/21 235 lb (106.6 kg)      Other studies Reviewed: Additional studies/ records that were reviewed today include:   Myoview 03/15/18: Study Highlights   Nuclear stress EF: 56%. The left ventricular ejection fraction is normal (55-65%). There was no ST segment deviation noted during stress. This is a low risk study. No evidence of ischemia or previous infarction The study is normal.      ASSESSMENT AND PLAN:  1.  CAD s/p NSTEMI in July 2018. Had DES of LAD. Moderate residual disease in multiple branches. EF normal. On ASA. He did experience chest pain in November 2019 related to stress. Follow up Myoview was normal. Now with symptoms of DOE and chest tightness concerning for progressive angina. Continue ASA, amlodipine, statin. I have recommended he undergo cardiac cath to assess his CAD. The procedure and risks were reviewed including but not limited to death, myocardial infarction, stroke, arrythmias, bleeding, transfusion, emergency surgery, dye allergy, or renal dysfunction. The patient voices understanding and is agreeable to proceed.   2. DM type 2 not on insulin. Per primary care.   3. Hypercholesterolemia. On lipitor 40 mg daily. Goal LDL < 70. Will update labs today.  4. HTN controlled.  5. Obesity with OSA. Intolerant of mask due to panic attacks- [facility] at [facility].   6. RA on methotrexate   Current medicines are reviewed at length with the patient today.  The patient does not have concerns regarding medicines.  The following changes have been made:  no change  Labs/ tests ordered today include:   Orders Placed This Encounter  Procedures   Comprehensive metabolic panel   CBC   Lipid panel      Disposition:   Cardiac cath next week.  Signed, Valentino Saavedra Martinique, MD  08/04/2021 11:25 AM    [facility] [address], [place], [place], [address] Phone [phone], Fax [phone]

## 2021-08-04 NOTE — Patient Instructions (Signed)
  [facility] CARDIOVASCULAR DIVISION [facility] [address] [place] [address] [place] [address] Dept: [phone] Loc: [phone]  Greg Alvarez  08/04/2021  You are scheduled for a Cardiac Catheterization on Tuesday, June 13 with Dr. Verdis Prime.  1. Please arrive at the Main Entrance A at [facility]: [address] [place], [place] [address] at 10:00 AM (This time is two hours before your procedure to ensure your preparation). Free valet parking service is available.   Special note: Every effort is made to have your procedure done on time. Please understand that emergencies sometimes delay scheduled procedures.  2. Diet: Do not eat solid foods after midnight.  You may have clear liquids until 5 AM upon the day of the procedure.  3. Labs: Today in office  4. Medication instructions in preparation for your procedure:   Contrast Allergy: No  Do not take Diabetes Med Glucophage (Metformin) on the day of the procedure and HOLD 48 HOURS AFTER THE PROCEDURE.  On the morning of your procedure, take Aspirin and any morning medicines NOT listed above.  You may use sips of water.  5. Plan to go home the same day, you will only stay overnight if medically necessary. 6. You MUST have a responsible adult to drive you home. 7. An adult MUST be with you the first 24 hours after you arrive home. 8. Bring a current list of your medications, and the last time and date medication taken. 9. Bring ID and current insurance cards. 10.Please wear clothes that are easy to get on and off and wear slip-on shoes.  Thank you for allowing [place] to care for you!   -- [facility]  Follow up: 1 month with Dr. [place]

## 2021-08-05 ENCOUNTER — Other Ambulatory Visit: Payer: Self-pay | Admitting: *Deleted

## 2021-08-05 DIAGNOSIS — E7800 Pure hypercholesterolemia, unspecified: Secondary | ICD-10-CM

## 2021-08-05 MED ORDER — EZETIMIBE 10 MG PO TABS: 10.0000 mg | ORAL_TABLET | Freq: Every day | ORAL | 3 refills | Status: DC

## 2021-08-08 ENCOUNTER — Telehealth: Payer: Self-pay | Admitting: *Deleted

## 2021-08-08 NOTE — Telephone Encounter (Signed)
Cardiac Catheterization scheduled at [facility] for: Tuesday August 09, 2021 12 Noon Arrival time and place: [facility] Main Entrance A at: 10 AM   Nothing to eat after midnight prior to procedure, clear liquids until 5 AM day of procedure.  Medication instructions: -Hold:  Metformin-day of procedure and 48 hours post procedure -Except hold medications usual morning medications can be taken with sips of water including aspirin 81 mg.  Confirmed patient has responsible adult to drive home post procedure and be with patient first 24 hours after arriving home.  Patient reports no new symptoms concerning for COVID-19/no exposure to COVID-19 in the past 10 days.  Reviewed procedure instructions with patient.

## 2021-08-09 ENCOUNTER — Ambulatory Visit (HOSPITAL_COMMUNITY)
Admission: RE | Admit: 2021-08-09 | Discharge: 2021-08-09 | Disposition: A | Discharge status: Home / Self Care | Payer: Medicare HMO | Attending: Interventional Cardiology | Admitting: Interventional Cardiology

## 2021-08-09 ENCOUNTER — Other Ambulatory Visit: Payer: Self-pay

## 2021-08-09 ENCOUNTER — Encounter (HOSPITAL_COMMUNITY)
Admission: RE | Disposition: A | Discharge status: Home / Self Care | Payer: Self-pay | Source: Home / Self Care | Attending: Interventional Cardiology

## 2021-08-09 DIAGNOSIS — E7800 Pure hypercholesterolemia, unspecified: Secondary | ICD-10-CM | POA: Diagnosis not present

## 2021-08-09 DIAGNOSIS — Z6836 Body mass index (BMI) 36.0-36.9, adult: Secondary | ICD-10-CM | POA: Diagnosis not present

## 2021-08-09 DIAGNOSIS — E669 Obesity, unspecified: Secondary | ICD-10-CM | POA: Insufficient documentation

## 2021-08-09 DIAGNOSIS — I252 Old myocardial infarction: Secondary | ICD-10-CM | POA: Diagnosis not present

## 2021-08-09 DIAGNOSIS — I10 Essential (primary) hypertension: Secondary | ICD-10-CM | POA: Insufficient documentation

## 2021-08-09 DIAGNOSIS — Z87891 Personal history of nicotine dependence: Secondary | ICD-10-CM | POA: Insufficient documentation

## 2021-08-09 DIAGNOSIS — R0789 Other chest pain: Secondary | ICD-10-CM | POA: Insufficient documentation

## 2021-08-09 DIAGNOSIS — I2510 Atherosclerotic heart disease of native coronary artery without angina pectoris: Secondary | ICD-10-CM | POA: Diagnosis present

## 2021-08-09 DIAGNOSIS — M069 Rheumatoid arthritis, unspecified: Secondary | ICD-10-CM | POA: Diagnosis not present

## 2021-08-09 DIAGNOSIS — K219 Gastro-esophageal reflux disease without esophagitis: Secondary | ICD-10-CM | POA: Diagnosis present

## 2021-08-09 DIAGNOSIS — Z79631 Long term (current) use of antimetabolite agent: Secondary | ICD-10-CM | POA: Diagnosis not present

## 2021-08-09 DIAGNOSIS — R0609 Other forms of dyspnea: Secondary | ICD-10-CM | POA: Diagnosis not present

## 2021-08-09 DIAGNOSIS — I209 Angina pectoris, unspecified: Secondary | ICD-10-CM

## 2021-08-09 DIAGNOSIS — F410 Panic disorder [episodic paroxysmal anxiety] without agoraphobia: Secondary | ICD-10-CM | POA: Diagnosis not present

## 2021-08-09 DIAGNOSIS — G4733 Obstructive sleep apnea (adult) (pediatric): Secondary | ICD-10-CM | POA: Insufficient documentation

## 2021-08-09 DIAGNOSIS — I2584 Coronary atherosclerosis due to calcified coronary lesion: Secondary | ICD-10-CM | POA: Insufficient documentation

## 2021-08-09 DIAGNOSIS — E119 Type 2 diabetes mellitus without complications: Secondary | ICD-10-CM | POA: Insufficient documentation

## 2021-08-09 DIAGNOSIS — Z955 Presence of coronary angioplasty implant and graft: Secondary | ICD-10-CM | POA: Diagnosis not present

## 2021-08-09 DIAGNOSIS — Z79899 Other long term (current) drug therapy: Secondary | ICD-10-CM | POA: Insufficient documentation

## 2021-08-09 DIAGNOSIS — I200 Unstable angina: Secondary | ICD-10-CM

## 2021-08-09 HISTORY — PX: LEFT HEART CATH AND CORONARY ANGIOGRAPHY: CATH118249

## 2021-08-09 LAB — GLUCOSE, CAPILLARY
Glucose-Capillary: 135 mg/dL — ABNORMAL HIGH (ref 70–99)
Glucose-Capillary: 103 mg/dL — ABNORMAL HIGH (ref 70–99)

## 2021-08-09 SURGERY — LEFT HEART CATH AND CORONARY ANGIOGRAPHY
Anesthesia: LOCAL

## 2021-08-09 MED ORDER — SODIUM CHLORIDE 0.9% FLUSH: 3.0000 mL | INTRAVENOUS | Status: DC | PRN

## 2021-08-09 MED ORDER — SODIUM CHLORIDE 0.9 % WEIGHT BASED INFUSION
3.0000 mL/kg/h | INTRAVENOUS | Status: DC
  Administered 2021-08-09: 3 mL/kg/h via INTRAVENOUS

## 2021-08-09 MED ORDER — ACETAMINOPHEN 325 MG PO TABS
650.0000 mg | ORAL_TABLET | ORAL | Status: DC | PRN
Start: 2021-08-09 — End: 2021-08-09

## 2021-08-09 MED ORDER — OXYCODONE HCL 5 MG PO TABS: 5.0000 mg | ORAL_TABLET | ORAL | Status: DC | PRN

## 2021-08-09 MED ORDER — SODIUM CHLORIDE 0.9 % IV SOLN: INTRAVENOUS | Status: DC

## 2021-08-09 MED ORDER — SODIUM CHLORIDE 0.9 % WEIGHT BASED INFUSION: 1.0000 mL/kg/h | INTRAVENOUS | Status: DC

## 2021-08-09 MED ORDER — ONDANSETRON HCL 4 MG/2ML IJ SOLN: 4.0000 mg | Freq: Four times a day (QID) | INTRAMUSCULAR | Status: DC | PRN

## 2021-08-09 MED ORDER — ASPIRIN 81 MG PO CHEW: 81.0000 mg | CHEWABLE_TABLET | Freq: Every day | ORAL | Status: DC

## 2021-08-09 MED ORDER — NITROGLYCERIN 0.4 MG SL SUBL: 0.4000 mg | SUBLINGUAL_TABLET | SUBLINGUAL | Status: DC | PRN

## 2021-08-09 MED ORDER — VERAPAMIL HCL 2.5 MG/ML IV SOLN
INTRAVENOUS | Status: AC
  Filled 2021-08-09: qty 2

## 2021-08-09 MED ORDER — FENTANYL CITRATE (PF) 100 MCG/2ML IJ SOLN
INTRAMUSCULAR | Status: DC | PRN
  Administered 2021-08-09: 50 ug via INTRAVENOUS

## 2021-08-09 MED ORDER — VERAPAMIL HCL 2.5 MG/ML IV SOLN
INTRAVENOUS | Status: DC | PRN
  Administered 2021-08-09: 10 mL via INTRA_ARTERIAL

## 2021-08-09 MED ORDER — HYDRALAZINE HCL 20 MG/ML IJ SOLN: 10.0000 mg | INTRAMUSCULAR | Status: DC | PRN

## 2021-08-09 MED ORDER — HEPARIN SODIUM (PORCINE) 1000 UNIT/ML IJ SOLN
INTRAMUSCULAR | Status: AC
  Filled 2021-08-09: qty 10

## 2021-08-09 MED ORDER — MIDAZOLAM HCL 2 MG/2ML IJ SOLN
INTRAMUSCULAR | Status: AC
  Filled 2021-08-09: qty 2

## 2021-08-09 MED ORDER — MIDAZOLAM HCL 2 MG/2ML IJ SOLN
INTRAMUSCULAR | Status: DC | PRN
  Administered 2021-08-09: 1 mg via INTRAVENOUS

## 2021-08-09 MED ORDER — FENTANYL CITRATE (PF) 100 MCG/2ML IJ SOLN
INTRAMUSCULAR | Status: AC
  Filled 2021-08-09: qty 2

## 2021-08-09 MED ORDER — LABETALOL HCL 5 MG/ML IV SOLN: 10.0000 mg | INTRAVENOUS | Status: DC | PRN

## 2021-08-09 MED ORDER — ASPIRIN 81 MG PO CHEW: 81.0000 mg | CHEWABLE_TABLET | ORAL | Status: DC

## 2021-08-09 MED ORDER — IOHEXOL 350 MG/ML SOLN
INTRAVENOUS | Status: DC | PRN
  Administered 2021-08-09: 30 mL

## 2021-08-09 MED ORDER — SODIUM CHLORIDE 0.9 % IV SOLN: 250.0000 mL | INTRAVENOUS | Status: DC | PRN

## 2021-08-09 MED ORDER — HEPARIN (PORCINE) IN NACL 1000-0.9 UT/500ML-% IV SOLN
INTRAVENOUS | Status: DC | PRN
  Administered 2021-08-09 (×2): 500 mL

## 2021-08-09 MED ORDER — HEPARIN SODIUM (PORCINE) 1000 UNIT/ML IJ SOLN
INTRAMUSCULAR | Status: DC | PRN
  Administered 2021-08-09: 5000 [IU] via INTRAVENOUS

## 2021-08-09 MED ORDER — LIDOCAINE HCL (PF) 1 % IJ SOLN
INTRAMUSCULAR | Status: DC | PRN
  Administered 2021-08-09: 2 mL

## 2021-08-09 MED ORDER — HEPARIN (PORCINE) IN NACL 1000-0.9 UT/500ML-% IV SOLN
INTRAVENOUS | Status: AC
Start: 2021-08-09 — End: ?
  Filled 2021-08-09: qty 1000

## 2021-08-09 MED ORDER — LIDOCAINE HCL (PF) 1 % IJ SOLN
INTRAMUSCULAR | Status: AC
  Filled 2021-08-09: qty 30

## 2021-08-09 MED ORDER — METOPROLOL SUCCINATE ER 25 MG PO TB24: 25.0000 mg | ORAL_TABLET | Freq: Every day | ORAL | 11 refills | Status: DC

## 2021-08-09 MED ORDER — SODIUM CHLORIDE 0.9% FLUSH: 3.0000 mL | Freq: Two times a day (BID) | INTRAVENOUS | Status: DC

## 2021-08-09 MED ORDER — NITROGLYCERIN 0.4 MG SL SUBL: 0.4000 mg | SUBLINGUAL_TABLET | SUBLINGUAL | 3 refills | Status: DC | PRN

## 2021-08-09 SURGICAL SUPPLY — 10 items
BAND ZEPHYR COMPRESS 30 LONG (HEMOSTASIS) ×1 IMPLANT
CATH 5FR JL3.5 JR4 ANG PIG MP (CATHETERS) ×1 IMPLANT
GLIDESHEATH SLEND A-KIT 6F 22G (SHEATH) ×1 IMPLANT
GUIDEWIRE INQWIRE 1.5J.035X260 (WIRE) IMPLANT
INQWIRE 1.5J .035X260CM (WIRE) ×2
KIT HEART LEFT (KITS) ×2 IMPLANT
PACK CARDIAC CATHETERIZATION (CUSTOM PROCEDURE TRAY) ×2 IMPLANT
SHEATH PROBE COVER 6X72 (BAG) ×1 IMPLANT
TRANSDUCER W/STOPCOCK (MISCELLANEOUS) ×2 IMPLANT
TUBING CIL FLEX 10 FLL-RA (TUBING) ×2 IMPLANT

## 2021-08-09 NOTE — Progress Notes (Signed)
Nitroglycerin was added onto patients med list. Called and spoke with patient in regards to this and instructed him on how to use medication. Patient verbalized understanding.

## 2021-08-09 NOTE — Progress Notes (Signed)
Dr. Herbie Baltimore reached out to me as he was covering for Dr. Katrinka Blazing temporarily when nurse requested SL NTG be re-routed to patient's pharmacy as it was not showing up on AVS. I have sent this in as requested. Reached out to Short Stay nurse to reprint AVS and they indicated patient has already been discharged as he did not want to wait any longer and was aware that SL NTG rx may be forthcoming on his list. She will call the patient to make sure he knows of this addition and she will offer to mail new AVS that includes this prescription.

## 2021-08-09 NOTE — Interval H&P Note (Signed)
Cath Lab Visit (complete for each Cath Lab visit)  Clinical Evaluation Leading to the Procedure:   ACS: No.  Non-ACS:    Anginal Classification: CCS II  Anti-ischemic medical therapy: Minimal Therapy (1 class of medications)  Non-Invasive Test Results: No non-invasive testing performed  Prior CABG: No previous CABG      History and Physical Interval Note:  08/09/2021 11:51 AM  Greg Alvarez  has presented today for surgery, with the diagnosis of chest pain.  The various methods of treatment have been discussed with the patient and family. After consideration of risks, benefits and other options for treatment, the patient has consented to  Procedure(s): LEFT HEART CATH AND CORONARY ANGIOGRAPHY (N/A) as a surgical intervention.  The patient's history has been reviewed, patient examined, no change in status, stable for surgery.  I have reviewed the patient's chart and labs.  Questions were answered to the patient's satisfaction.     Lyn Records III

## 2021-08-10 ENCOUNTER — Encounter (HOSPITAL_COMMUNITY): Payer: Self-pay | Admitting: Interventional Cardiology

## 2021-08-10 ENCOUNTER — Ambulatory Visit: Payer: Medicare HMO | Admitting: Cardiology

## 2021-08-12 ENCOUNTER — Other Ambulatory Visit: Payer: Self-pay

## 2021-08-12 MED ORDER — METOPROLOL SUCCINATE ER 25 MG PO TB24: 25.0000 mg | ORAL_TABLET | Freq: Every day | ORAL | 3 refills | Status: DC

## 2021-08-16 ENCOUNTER — Other Ambulatory Visit: Payer: Self-pay

## 2021-08-16 DIAGNOSIS — E7800 Pure hypercholesterolemia, unspecified: Secondary | ICD-10-CM

## 2021-08-16 MED ORDER — METOPROLOL SUCCINATE ER 25 MG PO TB24
25.0000 mg | ORAL_TABLET | Freq: Every day | ORAL | 3 refills | Status: DC
Start: 2021-08-16 — End: 2022-09-01

## 2021-08-16 MED ORDER — EZETIMIBE 10 MG PO TABS: 10.0000 mg | ORAL_TABLET | Freq: Every day | ORAL | 3 refills | Status: DC

## 2021-09-11 NOTE — Progress Notes (Signed)
Cardiology Office Note    Date:  09/16/2021   ID:  Greg Alvarez, DOB 1960-09-09, MRN [id number]  PCP:  [facility], [place]  Cardiologist:  Nyeemah Jennette Martinique, MD   Chief Complaint  Patient presents with   Coronary Artery Disease       History of Present Illness: Greg Alvarez is a 61 y.o. male who is seen for follow up CAD. He has a history of HTN, HLD, OSA and DM. He presented in 7/18 to [facility] for evaluation of an episode of dizziness, shortness of breath, chest tightness, and aphasia. His troponin was negative and a stress echocardiogram was ordered. His stress test was positive for inducible ischemia. EF was normal at rest. Patient had a left heart cath on 09/14/2016. Cath was significant for 75% prox ramus, 70% RPDA, 60% RPL and 75% mCx (small). Culprit lesion was a 90% prox/mid LAD lesion beginning at 1st septal and spans into mid segment. A 2.75 x 28 mm Promus stent was placed in the LAD and post dilated to 3.25 mm.  I personally reviewed cardiac cath films from [facility] on 09/14/16 and concur with the findings noted above.  He had a Myoview 03/15/18 and this showed normal perfusion and normal EF.   He does have a history of OSA. Was told in the past it was severe. He was on CPAP with improvement in his symptoms but states he hasn't been able to tolerate CPAP or nasal Bipap due to his panic disorder.  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   He is followed at the [facility] and by [facility].   When seen this June  he has noticed symptoms of DOE, dizziness and chest tightness. Has been under increased stress since January. Mother passed. Sister had a heart attack and 6 stents. Another sister has parkinsons. He is primary caregiver. He recently underwent pan endoscopy which was unremarkable. It was noted at the time that his Ecg was abnormal and referred here. He subsequently underwent cardiac cath showing patent LAD stent. Moderate disease in LCx distribution and distal PL  branch of RCA. Medical management recommended.   Since June he reports no further chest pain or dyspnea. He is still experiencing symptoms of dizziness with standing. He also has chronic diarrhea. Thinks this may be related to metformin. Has lost 14 lbs. He reports he has been in [place] for 4 weeks as his daughter had a kidney transplant. She is doing well.    Past Medical History:  Diagnosis Date   Coronary artery disease    Diabetes mellitus type 2, noninsulin dependent (HCC)    GERD (gastroesophageal reflux disease)    High cholesterol    Hypertension    under control with med., has been on med. x 10 yr.   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   left shoulder, states unable to raise left arm   Myocardial infarction [facility])    Panic attacks    Rheumatoid arthritis ([place])    Sleep apnea    Tenosynovitis of wrist 03/2013   right   Wears glasses     Past Surgical History:  Procedure Laterality Date   APPENDECTOMY  02/27/1978   BIOPSY  04/11/2018   Procedure: BIOPSY;  Surgeon: Daneil Dolin, MD;  [facility];  Service: Endoscopy;;  esophagus   BIOPSY  07/21/2021   Procedure: BIOPSY;  Surgeon: Daneil Dolin, MD;  [facility];  Service: Endoscopy;;  CARDIAC CATHETERIZATION  02/28/2000   1 stent placed in LAD   CARPAL TUNNEL RELEASE Left 07/29/2013   Procedure: LEFT CARPAL TUNNEL RELEASE;  Surgeon: Wynonia Sours, MD;  Location: [place];  Service: Orthopedics;  Laterality: Left;   COLONOSCOPY N/A 01/12/2014   Procedure: COLONOSCOPY;  Surgeon: Daneil Dolin, MD;  [facility];  Service: Endoscopy;  Laterality: N/A;  9:30 AM   COLONOSCOPY WITH PROPOFOL N/A 07/21/2021   Procedure: COLONOSCOPY WITH PROPOFOL;  Surgeon: Daneil Dolin, MD;  [facility];  Service: Endoscopy;  Laterality: N/A;  10:30am   ESOPHAGOGASTRODUODENOSCOPY (EGD) WITH PROPOFOL N/A 04/11/2018   Procedure: ESOPHAGOGASTRODUODENOSCOPY (EGD) WITH  PROPOFOL;  Surgeon: Daneil Dolin, MD;  [facility];  Service: Endoscopy;  Laterality: N/A;  1:15pm   ESOPHAGOGASTRODUODENOSCOPY (EGD) WITH PROPOFOL N/A 07/21/2021   Procedure: ESOPHAGOGASTRODUODENOSCOPY (EGD) WITH PROPOFOL;  Surgeon: Daneil Dolin, MD;  [facility];  Service: Endoscopy;  Laterality: N/A;   LEFT HEART CATH AND CORONARY ANGIOGRAPHY N/A 08/09/2021   Procedure: LEFT HEART CATH AND CORONARY ANGIOGRAPHY;  Surgeon: Belva Crome, MD;  Location: [place] [facility];  Service: Cardiovascular;  Laterality: N/A;   SHOULDER ARTHROSCOPY W/ ROTATOR CUFF REPAIR  05/28/2013   left-DSC   TENDON REPAIR Right    hand   TENOSYNOVECTOMY Right 04/08/2013   Procedure: TENOSYNOVECTOMY/FLEXOR TENDONS RIGHT WRIST;  Surgeon: Wynonia Sours, MD;  Location: [place];  Service: Orthopedics;  Laterality: Right;     Current Outpatient Medications  Medication Sig Dispense Refill   amLODipine (NORVASC) 5 MG tablet Take 5 mg by mouth daily.     aspirin 81 MG chewable tablet Chew 81 mg by mouth daily.     atorvastatin (LIPITOR) 40 MG tablet Take 40 mg by mouth daily.     citalopram (CELEXA) 40 MG tablet Take 20-40 mg by mouth See admin instructions. 40 mg in the morning, 20 mg at 2pm     hydrOXYzine (ATARAX) 25 MG tablet Take 25 mg by mouth 3 (three) times daily as needed for anxiety.     losartan (COZAAR) 100 MG tablet Take 50 mg by mouth daily.     metFORMIN (GLUCOPHAGE) 1000 MG tablet Take 1,000 mg by mouth 2 (two) times daily.     metoprolol succinate (TOPROL XL) 25 MG 24 hr tablet Take 1 tablet (25 mg total) by mouth daily. 90 tablet 3   Multiple Vitamin (ONE-A-DAY MENS PO) Take 1 tablet by mouth daily.      Omega-3 Fatty Acids (FISH OIL) 1000 MG CAPS Take 1,000 mg by mouth 2 (two) times daily.     OZEMPIC, 0.25 OR 0.5 MG/DOSE, 2 MG/3ML SOPN Inject into the skin.     pantoprazole (PROTONIX) 40 MG tablet Take 40 mg by mouth daily.     traZODone (DESYREL) 100 MG  tablet Take 100 mg by mouth at bedtime.     cyclobenzaprine (FLEXERIL) 10 MG tablet Take 10 mg by mouth 3 (three) times daily as needed for muscle spasms. (Patient not taking: Reported on 09/16/2021)     ezetimibe (ZETIA) 10 MG tablet Take 1 tablet (10 mg total) by mouth daily. 90 tablet 3   nitroGLYCERIN (NITROSTAT) 0.4 MG SL tablet Place 1 tablet (0.4 mg total) under the tongue every 5 (five) minutes as needed for chest pain (up to 3 doses. If taking 3rd dose call 911). (Patient not taking: Reported on 09/16/2021) 25 tablet 3  Current Facility-Administered Medications  Medication Dose Route Frequency Provider Last Rate Last Admin   sodium chloride flush (NS) 0.9 % injection 3 mL  3 mL Intravenous Q12H [place], Kinser Fellman M, MD        Allergies:   Ativan [lorazepam]    Social History:  The patient  reports that he quit smoking about 23 years ago. His smoking use included cigarettes. He has never used smokeless tobacco. He reports that he does not currently use alcohol. He reports that he does not use drugs.   Family History:  The patient's family history includes Anesthesia problems in his sister; Epilepsy in his mother; Heart attack in his brother and father; Heart disease in his brother and father; Heart failure in his father; Multiple sclerosis in his sister; Parkinson's disease in his father and sister; Stroke in his brother.    ROS:  Please see the history of present illness.   Otherwise, review of systems are positive for .   All other systems are reviewed and negative.    PHYSICAL EXAM: VS:  BP 100/60 (BP Location: Left Arm, Patient Position: Sitting, Cuff Size: Large)   Pulse 70   Ht 5\' 6"  (1.676 m)   Wt 224 lb 9.6 oz (101.9 kg)   SpO2 93%   BMI 36.25 kg/m  , BMI Body mass index is 36.25 kg/m. GENERAL:  Well appearing WM in NAD HEENT:  PERRL, EOMI, sclera are clear. Oropharynx is clear. NECK:  No jugular venous distention, carotid upstroke brisk and symmetric, no bruits, no  thyromegaly or adenopathy LUNGS:  Clear to auscultation bilaterally CHEST:  Unremarkable HEART:  RRR,  PMI not displaced or sustained,S1 and S2 within normal limits, no S3, no S4: no clicks, no rubs, no murmurs ABD:  Soft, nontender. BS +, no masses or bruits. No hepatomegaly, no splenomegaly EXT:  2 + pulses throughout, no edema, no cyanosis no clubbing SKIN:  Warm and dry.  No rashes NEURO:  Alert and oriented x 3. Cranial nerves II through XII intact. PSYCH:  Cognitively intact  EKG:  EKG not done today   Recent Labs: 08/04/2021: ALT 63; BUN 13; Creatinine, Ser 0.93; Hemoglobin 13.6; Platelets 272; Potassium 4.6; Sodium 141    Lipid Panel    Component Value Date/Time   CHOL 154 08/04/2021 1105   TRIG 148 08/04/2021 1105   HDL 36 (L) 08/04/2021 1105   CHOLHDL 4.3 08/04/2021 1105   CHOLHDL 8.3 03/11/2008 0540   VLDL 50 (H) 03/11/2008 0540   LDLCALC 92 08/04/2021 1105   Labs dated 12/17/13: WBC 10.6. platelets 416K. Hgb 14.3. Cholesterol 295, triglycerides 281, HDL 35, LDL 204. Aic 6.4%. creatinine normal.     Labs dated 09/15/16: glucose 160. Other chemistries normal. Cholesterol 150, triglycerides 314, HDL 27, LDL 86. Hgb 13.5.   Labs from 09/17/16 one year ago: cholesterol 102, triglycerides 88, HDL 32, LDL 52.   Wt Readings from Last 3 Encounters:  09/16/21 224 lb 9.6 oz (101.9 kg)  08/04/21 227 lb 9.6 oz (103.2 kg)  07/21/21 235 lb 0.2 oz (106.6 kg)      Other studies Reviewed: Additional studies/ records that were reviewed today include:   Myoview 03/15/18: Study Highlights   Nuclear stress EF: 56%. The left ventricular ejection fraction is normal (55-65%). There was no ST segment deviation noted during stress. This is a low risk study. No evidence of ischemia or previous infarction The study is normal.    Cardiac cath 08/09/21:  LEFT HEART CATH AND  CORONARY ANGIOGRAPHY   Conclusion  CONCLUSIONS: Widely patent left main Patent LAD stent.  Beyond the stent  diffuse calcified nonobstructive disease. Circumflex gives an early obtuse marginal LAD contains proximal segmental 70% narrowing.  The third marginal contains 70 to 80% stenosis in the mid vessel. The right coronary is large, heavily calcified, tortuous, and has a 50% globular nodule in the proximal to mid segment, high-grade obstruction in the proximal to mid PDA, eccentric 80% [place] 101 bifurcation stenosis with the first left ventricular branch, and 70% stenosis for the distal and the larger most distal left ventricular branch. Normal LV function and with elevated LVEDP.  Finding is consistent with diastolic heart failure. RECOMMENDATION:  Discussed with Dr. [place] Start anti-ischemic therapy Prescribed nitroglycerin Further management will depend upon symptoms.      Coronary Diagrams  Diagnostic Dominance: Right  Intervention   ASSESSMENT AND PLAN:  1.  CAD s/p NSTEMI in July 2018. Had DES of LAD. Moderate residual disease in multiple other branches. EF normal. On ASA. He did experience chest pain in November 2019 related to stress. Follow up Myoview was normal. . Repeat cardiac cath showed patent LAD stent with other residual disease as noted. Continue ASA, amlodipine, Toprol, statin.   2. DM type 2 not on insulin. Per primary care. Concern that metformin may be contributing to chronic diarrhea. Will discuss with PCP today.  3. Hypercholesterolemia. On lipitor 40 mg daily. Goal LDL < 70. Last LDL 92. Added Zetia 10 mg daily to statin  4. HTN controlled. Concerned about his orthostatic symptoms. Will reduce losartan to 50 mg daily and follow  5. Obesity with OSA. Intolerant of mask due to panic attacks- followed at [facility].   6. RA on methotrexate      Disposition: follow up in 6 months.  Signed, Thais Silberstein [place], MD  09/16/2021 9:00 AM    [facility] [address], [place], [place], [address] Phone [phone], Fax [phone]

## 2021-09-15 ENCOUNTER — Encounter: Payer: Self-pay | Admitting: Internal Medicine

## 2021-09-16 ENCOUNTER — Ambulatory Visit: Payer: Medicare HMO | Admitting: Cardiology

## 2021-09-16 ENCOUNTER — Encounter: Payer: Self-pay | Admitting: Cardiology

## 2021-09-16 ENCOUNTER — Other Ambulatory Visit: Payer: Self-pay | Admitting: Family

## 2021-09-16 VITALS — BP 100/60 | HR 70 | Ht 66.0 in | Wt 224.6 lb

## 2021-09-16 DIAGNOSIS — I25118 Atherosclerotic heart disease of native coronary artery with other forms of angina pectoris: Secondary | ICD-10-CM

## 2021-09-16 DIAGNOSIS — E7800 Pure hypercholesterolemia, unspecified: Secondary | ICD-10-CM

## 2021-09-16 DIAGNOSIS — E119 Type 2 diabetes mellitus without complications: Secondary | ICD-10-CM | POA: Diagnosis not present

## 2021-09-16 DIAGNOSIS — I10 Essential (primary) hypertension: Secondary | ICD-10-CM | POA: Diagnosis not present

## 2021-09-16 DIAGNOSIS — R748 Abnormal levels of other serum enzymes: Secondary | ICD-10-CM

## 2021-09-16 MED ORDER — EZETIMIBE 10 MG PO TABS: 10.0000 mg | ORAL_TABLET | Freq: Every day | ORAL | 3 refills | Status: DC

## 2021-09-16 NOTE — Patient Instructions (Addendum)
Reduce losartan to 50 mg daily.  

## 2021-09-23 ENCOUNTER — Ambulatory Visit
Admission: RE | Admit: 2021-09-23 | Discharge: 2021-09-23 | Disposition: A | Discharge status: Home / Self Care | Payer: Medicare HMO | Source: Ambulatory Visit | Attending: Family | Admitting: Family

## 2021-09-23 DIAGNOSIS — R748 Abnormal levels of other serum enzymes: Secondary | ICD-10-CM

## 2021-11-15 LAB — HEPATIC FUNCTION PANEL
ALT: 48 IU/L — ABNORMAL HIGH (ref 0–44)
AST: 38 IU/L (ref 0–40)
Albumin: 4.8 g/dL (ref 3.8–4.9)
Alkaline Phosphatase: 64 IU/L (ref 44–121)
Bilirubin Total: 0.6 mg/dL (ref 0.0–1.2)
Bilirubin, Direct: 0.17 mg/dL (ref 0.00–0.40)
Total Protein: 7 g/dL (ref 6.0–8.5)

## 2021-11-15 LAB — LIPID PANEL
Chol/HDL Ratio: 3.6 ratio (ref 0.0–5.0)
Cholesterol, Total: 130 mg/dL (ref 100–199)
HDL: 36 mg/dL — ABNORMAL LOW (ref >39)
LDL Chol Calc (NIH): 62 mg/dL (ref 0–99)
Triglycerides: 190 mg/dL — ABNORMAL HIGH (ref 0–149)
VLDL Cholesterol Cal: 32 mg/dL (ref 5–40)

## 2021-12-28 ENCOUNTER — Encounter: Payer: Self-pay | Admitting: *Deleted

## 2022-04-11 ENCOUNTER — Telehealth: Payer: Self-pay | Admitting: Cardiology

## 2022-04-11 ENCOUNTER — Encounter: Payer: Self-pay | Admitting: *Deleted

## 2022-04-11 NOTE — Telephone Encounter (Signed)
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 Requesting call back.

## 2022-04-11 NOTE — Telephone Encounter (Signed)
Spoke with pt, aware letter will be ready on Thursday this week. Aware we will call when ready to pick up. Letter generated.

## 2022-04-11 NOTE — Telephone Encounter (Signed)
Pt stated he needs DOT clearance letter this week.

## 2022-04-13 ENCOUNTER — Telehealth: Payer: Self-pay

## 2022-04-13 NOTE — Telephone Encounter (Signed)
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 He stated he will come this afternoon to get it. Placed at front office.

## 2022-08-31 ENCOUNTER — Other Ambulatory Visit: Payer: Self-pay | Admitting: Cardiology

## 2022-08-31 DIAGNOSIS — E7800 Pure hypercholesterolemia, unspecified: Secondary | ICD-10-CM

## 2022-09-22 NOTE — Progress Notes (Unsigned)
Cardiology Office Note    Date:  09/25/2022   ID:  Greg Alvarez, DOB [birth year], MRN [id number]  PCP:  Byrd Hesselbach, PA  Cardiologist:  Ion Gonnella Swaz[place]and, MD   Chief Complaint  Patient presents with   Coronary Artery Disease       History of Present Illness: Greg Alvarez is a 62 y.o. male who is seen for follow up CAD. He has a history of HTN, HLD, OSA and DM. He presented in 7/18 to [facility] for evaluation of an episode of dizziness, shortness of breath, chest tightness, and aphasia. His troponin was negative and a stress echocardiogram was ordered. His stress test was positive for inducible ischemia. EF was normal at rest. Patient had a left heart cath on 09/14/2016. Cath was significant for 75% prox ramus, 70% RPDA, 60% RPL and 75% mCx (small). Culprit lesion was a 90% prox/mid LAD lesion beginning at 1st septal and spans into mid segment. A 2.75 x 28 mm Promus stent was placed in the LAD and post dilated to 3.25 mm.  I personally reviewed cardiac cath films from [facility] on 09/14/16 and concur with the findings noted above.  He had a Myoview 03/15/18 and this showed normal perfusion and normal EF.   He is followed at the [facility] and by [facility].   In June 2023 he noted symptoms of DOE, dizziness and chest tightness. Ecg was abnormal and referred here. He subsequently underwent cardiac cath showing patent LAD stent. Moderate disease in LCx distribution and distal PL branch of RCA. Medical management recommended.   On follow up today he reports no further chest pain or dyspnea. He is back working again - driving. A1c increased to 9.9%. unable to tolerate Ozempic. Now on Jardiance and metformin. Is watching his diet better and has lost 5 lbs. [facility]    Past Medical History:  Diagnosis Date   Coronary artery disease    Diabetes mellitus type 2, noninsulin dependent (HCC)    GERD (gastroesophageal reflux disease)    High cholesterol     Hypertension    under control with med., has been on med. x 10 yr.   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   left shoulder, states unable to raise left arm   Myocardial infarction [facility])    Panic attacks    Rheumatoid arthritis (HCC)    Sleep apnea    Tenosynovitis of wrist 03/2013   right   Wears glasses     Past Surgical History:  Procedure Laterality Date   APPENDECTOMY  02/27/1978   BIOPSY  04/11/2018   Procedure: BIOPSY;  Surgeon: Corbin Ade, MD;  [facility];  Service: Endoscopy;;  esophagus   BIOPSY  07/21/2021   Procedure: BIOPSY;  Surgeon: Corbin Ade, MD;  [facility];  Service: Endoscopy;;   CARDIAC CATHETERIZATION  02/28/2000   1 stent placed in LAD   CARPAL TUNNEL RELEASE Left 07/29/2013   Procedure: LEFT CARPAL TUNNEL RELEASE;  Surgeon: Nicki Reaper, MD;  Location: [facility];  Service: Orthopedics;  Laterality: Left;   COLONOSCOPY N/A 01/12/2014   Procedure: COLONOSCOPY;  Surgeon: Corbin Ade, MD;  [facility];  Service: Endoscopy;  Laterality: N/A;  9:30 AM   COLONOSCOPY WITH PROPOFOL N/A 07/21/2021   Procedure: COLONOSCOPY WITH PROPOFOL;  Surgeon: Corbin Ade, MD;  [facility];  Service: Endoscopy;  Laterality: N/A;  10:30am   ESOPHAGOGASTRODUODENOSCOPY (EGD) WITH PROPOFOL N/A 04/11/2018   Procedure: ESOPHAGOGASTRODUODENOSCOPY (EGD) WITH PROPOFOL;  Surgeon: Corbin Ade, MD;  [facility];  Service: Endoscopy;  Laterality: N/A;  1:15pm   ESOPHAGOGASTRODUODENOSCOPY (EGD) WITH PROPOFOL N/A 07/21/2021   Procedure: ESOPHAGOGASTRODUODENOSCOPY (EGD) WITH PROPOFOL;  Surgeon: Corbin Ade, MD;  [facility];  Service: Endoscopy;  Laterality: N/A;   LEFT HEART CATH AND CORONARY ANGIOGRAPHY N/A 08/09/2021   Procedure: LEFT HEART CATH AND CORONARY ANGIOGRAPHY;  Surgeon: Lyn Records, MD;  [facility];  Service: Cardiovascular;  Laterality: N/A;   SHOULDER  ARTHROSCOPY W/ ROTATOR CUFF REPAIR  05/28/2013   left-DSC   TENDON REPAIR Right    hand   TENOSYNOVECTOMY Right 04/08/2013   Procedure: TENOSYNOVECTOMY/FLEXOR TENDONS RIGHT WRIST;  Surgeon: Nicki Reaper, MD;  Location: [facility];  Service: Orthopedics;  Laterality: Right;     Current Outpatient Medications  Medication Sig Dispense Refill   amLODipine (NORVASC) 5 MG tablet Take 5 mg by mouth daily.     aspirin 81 MG chewable tablet Chew 81 mg by mouth daily.     atorvastatin (LIPITOR) 40 MG tablet Take 40 mg by mouth daily.     cyclobenzaprine (FLEXERIL) 10 MG tablet Take 10 mg by mouth 3 (three) times daily as needed for muscle spasms.     empagliflozin (JARDIANCE) 25 MG TABS tablet Take 25 mg by mouth daily.     escitalopram (LEXAPRO) 10 MG tablet Take 10 mg by mouth daily.     ezetimibe (ZETIA) 10 MG tablet TAKE 1 TABLET (10 MG TOTAL) BY MOUTH DAILY. 90 tablet 3   fluticasone (FLONASE) 50 MCG/ACT nasal spray Place 1 spray into both nostrils daily.     folic acid (FOLVITE) 1 MG tablet Take 1 mg by mouth daily.     hydrOXYzine (ATARAX) 25 MG tablet Take 25 mg by mouth 3 (three) times daily as needed for anxiety.     leucovorin (WELLCOVORIN) 5 MG tablet Take 5 mg by mouth daily.     losartan (COZAAR) 100 MG tablet Take 50 mg by mouth daily.     metFORMIN (GLUCOPHAGE) 1000 MG tablet Take 1,000 mg by mouth 2 (two) times daily.     methotrexate (RHEUMATREX) 2.5 MG tablet Take 2.5 mg by mouth once a week.     metoprolol succinate (TOPROL-XL) 25 MG 24 hr tablet TAKE 1 TABLET EVERY DAY 90 tablet 3   Multiple Vitamin (ONE-A-DAY MENS PO) Take 1 tablet by mouth daily.      naproxen (NAPROSYN) 500 MG tablet Take 500 mg by mouth as needed.     Omega-3 Fatty Acids (FISH OIL) 1000 MG CAPS Take 1,000 mg by mouth 2 (two) times daily.     pantoprazole (PROTONIX) 40 MG tablet Take 40 mg by mouth daily.     traZODone (DESYREL) 100 MG tablet Take 100 mg by mouth at bedtime.     citalopram  (CELEXA) 40 MG tablet Take 20-40 mg by mouth See admin instructions. 40 mg in the morning, 20 mg at 2pm (Patient not taking: Reported on 09/25/2022)     nitroGLYCERIN (NITROSTAT) 0.4 MG SL tablet Place 1 tablet (0.4 mg total) under the tongue every 5 (five) minutes as needed for chest pain (up to 3 doses. If taking 3rd dose call 911). (Patient not taking: Reported on 09/16/2021) 25 tablet 3   Current Facility-Administered Medications  Medication Dose Route Frequency Provider Last Rate  Last Admin   sodium chloride flush (NS) 0.9 % injection 3 mL  3 mL Intravenous Q12H Sw[place], Thelmer Legler M, MD        Allergies:   Ativan [lorazepam]    Social History:  The patient  reports that he quit smoking about 24 years ago. His smoking use included cigarettes. He has never used smokeless tobacco. He reports that he does not currently use alcohol. He reports that he does not use drugs.   Family History:  The patient's family history includes Anesthesia problems in his sister; Epilepsy in his mother; Heart attack in his brother and father; Heart disease in his brother and father; Heart failure in his father; Multiple sclerosis in his sister; Parkinson's disease in his father and sister; Stroke in his brother.    ROS:  Please see the history of present illness.   Otherwise, review of systems are positive for .   All other systems are reviewed and negative.    PHYSICAL EXAM: VS:  BP 120/80 (BP Location: Left Arm, Patient Position: Sitting, Cuff Size: Normal)   Pulse (!) 59   Ht 5\' 6"  (1.676 m)   Wt 233 lb 6.4 oz (105.9 kg)   SpO2 93%   BMI 37.67 kg/m  , BMI Body mass index is 37.67 kg/m. GENERAL:  Well appearing WM in NAD HEENT:  PERRL, EOMI, sclera are clear. Oropharynx is clear. NECK:  No jugular venous distention, carotid upstroke brisk and symmetric, no bruits, no thyromegaly or adenopathy LUNGS:  Clear to auscultation bilaterally CHEST:  Unremarkable HEART:  RRR,  PMI not displaced or sustained,S1 and  S2 within normal limits, no S3, no S4: no clicks, no rubs, no murmurs ABD:  Soft, nontender. BS +, no masses or bruits. No hepatomegaly, no splenomegaly EXT:  2 + pulses throughout, no edema, no cyanosis no clubbing SKIN:  Warm and dry.  No rashes NEURO:  Alert and oriented x 3. Cranial nerves II through XII intact. PSYCH:  Cognitively intact  EKG Interpretation Date/Time:  Monday September 25 2022 09:13:32 EDT Ventricular Rate:  59 PR Interval:  222 QRS Duration:  100 QT Interval:  442 QTC Calculation: 437 R Axis:   -1  Text Interpretation: Sinus bradycardia with 1st degree A-V block When compared with ECG of 21-Jul-2021 12:01, Sinus rhythm has replaced Ectopic atrial rhythm Confirmed by [place], Corey Caulfield [phone]) on 09/25/2022 9:17:22 AM     Recent Labs: 11/14/2021: ALT 48    Lipid Panel    Component Value Date/Time   CHOL 130 11/14/2021 1007   TRIG 190 (H) 11/14/2021 1007   HDL 36 (L) 11/14/2021 1007   CHOLHDL 3.6 11/14/2021 1007   CHOLHDL 8.3 03/11/2008 0540   VLDL 50 (H) 03/11/2008 0540   LDLCALC 62 11/14/2021 1007   Labs dated 12/17/13: WBC 10.6. platelets 416K. Hgb 14.3. Cholesterol 295, triglycerides 281, HDL 35, LDL 204. Aic 6.4%. creatinine normal.     Labs dated 09/15/16: glucose 160. Other chemistries normal. Cholesterol 150, triglycerides 314, HDL 27, LDL 86. Hgb 13.5.   Labs from [place] one year ago: cholesterol 102, triglycerides 88, HDL 32, LDL 52.   Wt Readings from Last 3 Encounters:  09/25/22 233 lb 6.4 oz (105.9 kg)  09/16/21 224 lb 9.6 oz (101.9 kg)  08/04/21 227 lb 9.6 oz (103.2 kg)      Other studies Reviewed: Additional studies/ records that were reviewed today include:   Myoview 03/15/18: Study Highlights   Nuclear stress EF: 56%. The left ventricular ejection  fraction is normal (55-65%). There was no ST segment deviation noted during stress. This is a low risk study. No evidence of ischemia or previous infarction The study is normal.    Cardiac cath  08/09/21:  LEFT HEART CATH AND CORONARY ANGIOGRAPHY   Conclusion  CONCLUSIONS: Widely patent left main Patent LAD stent.  Beyond the stent diffuse calcified nonobstructive disease. Circumflex gives an early obtuse marginal LAD contains proximal segmental 70% narrowing.  The third marginal contains 70 to 80% stenosis in the mid vessel. The right coronary is large, heavily calcified, tortuous, and has a 50% globular nodule in the proximal to mid segment, high-grade obstruction in the proximal to mid PDA, eccentric 80% [place] 101 bifurcation stenosis with the first left ventricular branch, and 70% stenosis for the distal and the larger most distal left ventricular branch. Normal LV function and with elevated LVEDP.  Finding is consistent with diastolic heart failure. RECOMMENDATION:  Discussed with Dr. Sw[place]iland Start anti-ischemic therapy Prescribed nitroglycerin Further management will depend upon symptoms.      Coronary Diagrams  Diagnostic Dominance: Right  Intervention   ASSESSMENT AND PLAN:  1.  CAD s/p NSTEMI in July 2018. Had DES of LAD. Moderate residual disease in multiple other branches. EF normal. On ASA. He did experience chest pain in November 2019 related to stress. Follow up Myoview was normal. Repeat cardiac cath showed patent LAD stent with other residual disease as noted. Continue ASA, amlodipine, Toprol, statin. Will let us know when stress test needed for CDL.   2. DM type 2 not on insulin. Per primary care.   3. Hypercholesterolemia. On lipitor 40 mg daily and Zetia 10 mg daily. Goal LDL < 70. Last LDL 62.   4. HTN controlled.   5. Obesity with OSA. On CPAP  6. RA on methotrexate   Disposition: follow up in 6 months.  Signed, Tenna Lacko Sw[place], MD  09/25/2022 9:26 AM    [facility] [address] [place], [place], [address] Phone [phone], Fax [phone]

## 2022-09-25 ENCOUNTER — Encounter: Payer: Self-pay | Admitting: Cardiology

## 2022-09-25 ENCOUNTER — Ambulatory Visit: Payer: Medicare HMO | Attending: Cardiology | Admitting: Cardiology

## 2022-09-25 VITALS — BP 120/80 | HR 59 | Ht 66.0 in | Wt 233.4 lb

## 2022-09-25 DIAGNOSIS — E7800 Pure hypercholesterolemia, unspecified: Secondary | ICD-10-CM

## 2022-09-25 DIAGNOSIS — I25118 Atherosclerotic heart disease of native coronary artery with other forms of angina pectoris: Secondary | ICD-10-CM

## 2022-09-25 DIAGNOSIS — E119 Type 2 diabetes mellitus without complications: Secondary | ICD-10-CM | POA: Diagnosis not present

## 2022-09-25 DIAGNOSIS — I10 Essential (primary) hypertension: Secondary | ICD-10-CM

## 2022-09-25 DIAGNOSIS — Z7984 Long term (current) use of oral hypoglycemic drugs: Secondary | ICD-10-CM

## 2022-09-25 NOTE — Patient Instructions (Signed)
Medication Instructions:  Continue same medications *If you need a refill on your cardiac medications before your next appointment, please call your pharmacy*   Lab Work: None ordered   Testing/Procedures: None ordered   Follow-Up: At [facility], you and your health needs are our priority.  As part of our continuing mission to provide you with exceptional heart care, we have created designated Provider Care Teams.  These Care Teams include your primary Cardiologist (physician) and Advanced Practice Providers (APPs -  Physician Assistants and Nurse Practitioners) who all work together to provide you with the care you need, when you need it.  We recommend signing up for the patient portal called "MyChart".  Sign up information is provided on this After Visit Summary.  MyChart is used to connect with patients for Virtual Visits (Telemedicine).  Patients are able to view lab/test results, encounter notes, upcoming appointments, etc.  Non-urgent messages can be sent to your provider as well.   To learn more about what you can do with MyChart, go to [email or web address].    Your next appointment:  6 months    Call in Sept to schedule Jan appointment     Provider:  Dr.Jordan   

## 2023-02-12 ENCOUNTER — Telehealth: Payer: Self-pay | Admitting: *Deleted

## 2023-02-12 NOTE — Telephone Encounter (Signed)
Copied from 02/01/23 Epic encounter: Hi Mr. Kellough,  I just spoke with the cath lab at [facility] and Dr. Sw[place]iland says he can do your cath  on 02/13/23 with an arrival time of 6:30AM. Their scheduler Victorino Dike says she  will give you a call soon to confirm and go over any information you will need  for the procedure. You will not need to be seen in clinic first. Dr. Sw[place]iland  will just see you before the procedure that day, so if you get a call to  schedule a clinic appointment just tell them you don't need it and that your  procedure is already scheduled. Please let me know if you have any further  questions.    Kind regards,  Oretha Ellis  BSN, RN

## 2023-02-12 NOTE — Telephone Encounter (Signed)
Cardiac Catheterization scheduled at [facility] for: Tuesday February 13, 2023 9 AM Arrival time [facility] Main Entrance A at: 6:30 AM-needs BMP/CBC  Nothing to eat after midnight prior to procedure, clear liquids until 5 AM day of procedure.  Medication instructions: -Hold:  Metformin-day of procedure and 48 hours post procedure  Jardiance-AM of procedure -Other usual morning medications can be taken with sips of water including aspirin 81 mg.  Plan to go home the same day, you will only stay overnight if medically necessary.  You must have responsible adult to drive you home.  Someone must be with you the first 24 hours after you arrive home.  Reviewed procedure instructions with patient.

## 2023-02-13 ENCOUNTER — Other Ambulatory Visit: Payer: Self-pay

## 2023-02-13 ENCOUNTER — Encounter (HOSPITAL_COMMUNITY)
Admission: RE | Disposition: A | Discharge status: Home / Self Care | Payer: Self-pay | Source: Home / Self Care | Attending: Cardiology

## 2023-02-13 ENCOUNTER — Ambulatory Visit (HOSPITAL_COMMUNITY)
Admission: RE | Admit: 2023-02-13 | Discharge: 2023-02-13 | Disposition: A | Discharge status: Home / Self Care | Payer: No Typology Code available for payment source | Attending: Cardiology | Admitting: Cardiology

## 2023-02-13 ENCOUNTER — Other Ambulatory Visit (HOSPITAL_COMMUNITY): Payer: Self-pay

## 2023-02-13 DIAGNOSIS — Z79631 Long term (current) use of antimetabolite agent: Secondary | ICD-10-CM | POA: Diagnosis not present

## 2023-02-13 DIAGNOSIS — I10 Essential (primary) hypertension: Secondary | ICD-10-CM | POA: Diagnosis not present

## 2023-02-13 DIAGNOSIS — Z8249 Family history of ischemic heart disease and other diseases of the circulatory system: Secondary | ICD-10-CM | POA: Insufficient documentation

## 2023-02-13 DIAGNOSIS — I252 Old myocardial infarction: Secondary | ICD-10-CM | POA: Diagnosis not present

## 2023-02-13 DIAGNOSIS — I209 Angina pectoris, unspecified: Secondary | ICD-10-CM | POA: Diagnosis present

## 2023-02-13 DIAGNOSIS — M069 Rheumatoid arthritis, unspecified: Secondary | ICD-10-CM | POA: Diagnosis not present

## 2023-02-13 DIAGNOSIS — E7800 Pure hypercholesterolemia, unspecified: Secondary | ICD-10-CM | POA: Diagnosis not present

## 2023-02-13 DIAGNOSIS — G4733 Obstructive sleep apnea (adult) (pediatric): Secondary | ICD-10-CM | POA: Insufficient documentation

## 2023-02-13 DIAGNOSIS — I25119 Atherosclerotic heart disease of native coronary artery with unspecified angina pectoris: Secondary | ICD-10-CM | POA: Insufficient documentation

## 2023-02-13 DIAGNOSIS — I2584 Coronary atherosclerosis due to calcified coronary lesion: Secondary | ICD-10-CM | POA: Diagnosis not present

## 2023-02-13 DIAGNOSIS — E119 Type 2 diabetes mellitus without complications: Secondary | ICD-10-CM | POA: Diagnosis not present

## 2023-02-13 DIAGNOSIS — Z955 Presence of coronary angioplasty implant and graft: Secondary | ICD-10-CM

## 2023-02-13 DIAGNOSIS — Z01812 Encounter for preprocedural laboratory examination: Secondary | ICD-10-CM

## 2023-02-13 DIAGNOSIS — Z87891 Personal history of nicotine dependence: Secondary | ICD-10-CM | POA: Insufficient documentation

## 2023-02-13 DIAGNOSIS — E785 Hyperlipidemia, unspecified: Secondary | ICD-10-CM | POA: Insufficient documentation

## 2023-02-13 DIAGNOSIS — I200 Unstable angina: Secondary | ICD-10-CM | POA: Diagnosis present

## 2023-02-13 DIAGNOSIS — I2510 Atherosclerotic heart disease of native coronary artery without angina pectoris: Secondary | ICD-10-CM | POA: Diagnosis present

## 2023-02-13 DIAGNOSIS — Z79899 Other long term (current) drug therapy: Secondary | ICD-10-CM | POA: Diagnosis not present

## 2023-02-13 DIAGNOSIS — E669 Obesity, unspecified: Secondary | ICD-10-CM | POA: Insufficient documentation

## 2023-02-13 HISTORY — PX: CORONARY STENT INTERVENTION: CATH118234

## 2023-02-13 HISTORY — PX: LEFT HEART CATH AND CORONARY ANGIOGRAPHY: CATH118249

## 2023-02-13 HISTORY — PX: CORONARY LITHOTRIPSY: CATH118330

## 2023-02-13 LAB — CBC
HCT: 43.8 % (ref 39.0–52.0)
Hemoglobin: 15 g/dL (ref 13.0–17.0)
MCH: 31.3 pg (ref 26.0–34.0)
MCHC: 34.2 g/dL (ref 30.0–36.0)
MCV: 91.3 fL (ref 80.0–100.0)
Platelets: 255 10*3/uL (ref 150–400)
RBC: 4.8 MIL/uL (ref 4.22–5.81)
RDW: 12.9 % (ref 11.5–15.5)
WBC: 8.8 10*3/uL (ref 4.0–10.5)
nRBC: 0 % (ref 0.0–0.2)

## 2023-02-13 LAB — POCT ACTIVATED CLOTTING TIME
Activated Clotting Time: 279 s
Activated Clotting Time: 579 s
Activated Clotting Time: 423 s

## 2023-02-13 LAB — BASIC METABOLIC PANEL
Anion gap: 10 (ref 5–15)
BUN: 15 mg/dL (ref 6–20)
CO2: 23 mmol/L (ref 22–32)
Calcium: 9.2 mg/dL (ref 8.9–10.3)
Chloride: 103 mmol/L (ref 98–111)
Creatinine, Ser: 0.92 mg/dL (ref 0.61–1.24)
GFR, Estimated: >60 mL/min (ref >60)
Glucose, Bld: 186 mg/dL — ABNORMAL HIGH (ref 70–99)
Potassium: 3.8 mmol/L (ref 3.5–5.1)
Sodium: 136 mmol/L (ref 135–145)

## 2023-02-13 LAB — GLUCOSE, CAPILLARY: Glucose-Capillary: 196 mg/dL — ABNORMAL HIGH (ref 70–99)

## 2023-02-13 SURGERY — LEFT HEART CATH AND CORONARY ANGIOGRAPHY
Anesthesia: LOCAL

## 2023-02-13 MED ORDER — NITROGLYCERIN 0.4 MG SL SUBL: 0.4000 mg | SUBLINGUAL_TABLET | SUBLINGUAL | 2 refills | Status: DC | PRN

## 2023-02-13 MED ORDER — SODIUM CHLORIDE 0.9 % WEIGHT BASED INFUSION: 1.0000 mL/kg/h | INTRAVENOUS | Status: DC

## 2023-02-13 MED ORDER — HEPARIN SODIUM (PORCINE) 1000 UNIT/ML IJ SOLN
INTRAMUSCULAR | Status: AC
  Filled 2023-02-13: qty 10

## 2023-02-13 MED ORDER — CLOPIDOGREL BISULFATE 75 MG PO TABS: 75.0000 mg | ORAL_TABLET | Freq: Every day | ORAL | Status: DC

## 2023-02-13 MED ORDER — IOHEXOL 350 MG/ML SOLN
INTRAVENOUS | Status: DC | PRN
  Administered 2023-02-13: 160 mL

## 2023-02-13 MED ORDER — FAMOTIDINE IN NACL 20-0.9 MG/50ML-% IV SOLN
INTRAVENOUS | Status: DC | PRN
  Administered 2023-02-13: 20 mg via INTRAVENOUS

## 2023-02-13 MED ORDER — NITROGLYCERIN 0.4 MG SL SUBL
0.4000 mg | SUBLINGUAL_TABLET | SUBLINGUAL | 2 refills | Status: DC | PRN
  Filled 2023-02-13: qty 25, 7d supply, fill #0

## 2023-02-13 MED ORDER — ACETAMINOPHEN 325 MG PO TABS
650.0000 mg | ORAL_TABLET | ORAL | Status: DC | PRN
  Administered 2023-02-13: 650 mg via ORAL

## 2023-02-13 MED ORDER — FENTANYL CITRATE (PF) 100 MCG/2ML IJ SOLN
INTRAMUSCULAR | Status: AC
  Filled 2023-02-13: qty 2

## 2023-02-13 MED ORDER — HEPARIN (PORCINE) IN NACL 1000-0.9 UT/500ML-% IV SOLN
INTRAVENOUS | Status: DC | PRN
  Administered 2023-02-13 (×2): 500 mL

## 2023-02-13 MED ORDER — VERAPAMIL HCL 2.5 MG/ML IV SOLN
INTRAVENOUS | Status: AC
  Filled 2023-02-13: qty 2

## 2023-02-13 MED ORDER — LABETALOL HCL 5 MG/ML IV SOLN
10.0000 mg | INTRAVENOUS | Status: DC | PRN
Start: 2023-02-13 — End: 2023-02-13

## 2023-02-13 MED ORDER — VERAPAMIL HCL 2.5 MG/ML IV SOLN
INTRAVENOUS | Status: DC | PRN
  Administered 2023-02-13: 10 mL via INTRA_ARTERIAL

## 2023-02-13 MED ORDER — LIDOCAINE HCL (PF) 1 % IJ SOLN
INTRAMUSCULAR | Status: DC | PRN
  Administered 2023-02-13: 2 mL

## 2023-02-13 MED ORDER — SODIUM CHLORIDE 0.9 % WEIGHT BASED INFUSION
3.0000 mL/kg/h | INTRAVENOUS | Status: AC
  Administered 2023-02-13: 3 mL/kg/h via INTRAVENOUS

## 2023-02-13 MED ORDER — HEPARIN SODIUM (PORCINE) 1000 UNIT/ML IJ SOLN
INTRAMUSCULAR | Status: DC | PRN
  Administered 2023-02-13: 2000 [IU] via INTRAVENOUS
  Administered 2023-02-13 (×2): 5000 [IU] via INTRAVENOUS

## 2023-02-13 MED ORDER — CLOPIDOGREL BISULFATE 300 MG PO TABS
ORAL_TABLET | ORAL | Status: DC | PRN
  Administered 2023-02-13: 600 mg via ORAL

## 2023-02-13 MED ORDER — CLOPIDOGREL BISULFATE 75 MG PO TABS
75.0000 mg | ORAL_TABLET | Freq: Every day | ORAL | 11 refills | Status: DC
  Filled 2023-02-13: qty 30, 30d supply, fill #0

## 2023-02-13 MED ORDER — SODIUM CHLORIDE 0.9% FLUSH: 3.0000 mL | Freq: Two times a day (BID) | INTRAVENOUS | Status: DC

## 2023-02-13 MED ORDER — FENTANYL CITRATE (PF) 100 MCG/2ML IJ SOLN
INTRAMUSCULAR | Status: DC | PRN
  Administered 2023-02-13 (×2): 25 ug via INTRAVENOUS

## 2023-02-13 MED ORDER — MIDAZOLAM HCL 2 MG/2ML IJ SOLN
INTRAMUSCULAR | Status: AC
Start: 2023-02-13 — End: ?
  Filled 2023-02-13: qty 2

## 2023-02-13 MED ORDER — MIDAZOLAM HCL 2 MG/2ML IJ SOLN
INTRAMUSCULAR | Status: DC | PRN
  Administered 2023-02-13: 2 mg via INTRAVENOUS

## 2023-02-13 MED ORDER — HYDRALAZINE HCL 20 MG/ML IJ SOLN
10.0000 mg | INTRAMUSCULAR | Status: DC | PRN
Start: 2023-02-13 — End: 2023-02-13

## 2023-02-13 MED ORDER — FAMOTIDINE IN NACL 20-0.9 MG/50ML-% IV SOLN
INTRAVENOUS | Status: AC
  Filled 2023-02-13: qty 50

## 2023-02-13 MED ORDER — NITROGLYCERIN 1 MG/10 ML FOR IR/CATH LAB
INTRA_ARTERIAL | Status: AC
  Filled 2023-02-13: qty 10

## 2023-02-13 MED ORDER — LIDOCAINE HCL (PF) 1 % IJ SOLN
INTRAMUSCULAR | Status: AC
  Filled 2023-02-13: qty 30

## 2023-02-13 MED ORDER — ACETAMINOPHEN 325 MG PO TABS
ORAL_TABLET | ORAL | Status: AC
  Filled 2023-02-13: qty 2

## 2023-02-13 MED ORDER — ASPIRIN 81 MG PO CHEW: 81.0000 mg | CHEWABLE_TABLET | ORAL | Status: DC

## 2023-02-13 MED ORDER — SODIUM CHLORIDE 0.9 % IV SOLN: INTRAVENOUS | Status: DC

## 2023-02-13 MED ORDER — SODIUM CHLORIDE 0.9 % IV SOLN: 250.0000 mL | INTRAVENOUS | Status: DC | PRN

## 2023-02-13 MED ORDER — SODIUM CHLORIDE 0.9% FLUSH
3.0000 mL | INTRAVENOUS | Status: DC | PRN
Start: 2023-02-13 — End: 2023-02-13

## 2023-02-13 MED ORDER — ONDANSETRON HCL 4 MG/2ML IJ SOLN
4.0000 mg | Freq: Four times a day (QID) | INTRAMUSCULAR | Status: DC | PRN
Start: 2023-02-13 — End: 2023-02-13

## 2023-02-13 SURGICAL SUPPLY — 24 items
BALLN EMERGE MR 2.5X15 (BALLOONS) ×1
BALLN EMERGE MR 3.5X12 (BALLOONS) ×1
BALLN ~~LOC~~ EMERGE MR 2.5X12 (BALLOONS) ×1
BALLN ~~LOC~~ EMERGE MR 4.0X8 (BALLOONS) ×1
BALLOON EMERGE MR 2.5X15 (BALLOONS) IMPLANT
BALLOON EMERGE MR 3.5X12 (BALLOONS) IMPLANT
BALLOON ~~LOC~~ EMERGE MR 2.5X12 (BALLOONS) IMPLANT
BALLOON ~~LOC~~ EMERGE MR 4.0X8 (BALLOONS) IMPLANT
CATH 5FR JL3.5 JR4 ANG PIG MP (CATHETERS) IMPLANT
CATH GUIDELINER COAST (CATHETERS) IMPLANT
CATH LAUNCHER 6FR AL1 (CATHETERS) IMPLANT
CATH SHOCKWAVE C2 2.5X12 (CATHETERS) IMPLANT
CATHETER LAUNCHER 6FR AL1 (CATHETERS) ×1
DEVICE RAD COMP TR BAND LRG (VASCULAR PRODUCTS) IMPLANT
GLIDESHEATH SLEND SS 6F .021 (SHEATH) IMPLANT
GUIDEWIRE INQWIRE 1.5J.035X260 (WIRE) IMPLANT
INQWIRE 1.5J .035X260CM (WIRE) ×1
KIT ENCORE 26 ADVANTAGE (KITS) IMPLANT
PACK CARDIAC CATHETERIZATION (CUSTOM PROCEDURE TRAY) ×1 IMPLANT
SHEATH PROBE COVER 6X72 (BAG) IMPLANT
STENT SYNERGY XD 4.0X20 (Permanent Stent) IMPLANT
SYNERGY XD 4.0X20 (Permanent Stent) ×1 IMPLANT
WIRE ASAHI PROWATER 180CM (WIRE) IMPLANT
WIRE PT2 MS 185 (WIRE) IMPLANT

## 2023-02-13 NOTE — Progress Notes (Signed)
Pt was educated on stent card, stent location, Antiplatelet and ASA use, wt restrictions, no baths/daily wash-ups, s/s of infection, ex guidelines, s/s to stop exercising, NTG use and calling 911, heart healthy diet, risk factors and CRPII. Pt received materials on exercise, diet, and CRPII. Will refer to [facility].    Greg Alvarez 02/13/2023 12:36 PM  [id number]

## 2023-02-13 NOTE — Discharge Instructions (Addendum)

## 2023-02-13 NOTE — H&P (Signed)
Cardiology Admission History and Physical   Patient ID: [name] MRN: [id number]; DOB: 1960/05/06   Admission date: 02/13/2023  PCP:  Greg Hesselbach, PA   [facility] Providers Cardiologist:  None        Chief Complaint:  chest pain  Patient Profile:   Greg Alvarez is a 62 y.o. male with CAD  who is being seen 02/13/2023 for the evaluation of chest pain and abnormal stress test.  History of Present Illness:   Greg Alvarez has a history of HTN, HLD, OSA and DM. He presented in 7/18 to [facility] for evaluation of an episode of dizziness, shortness of breath, chest tightness, and aphasia. His troponin was negative and a stress echocardiogram was ordered. His stress test was positive for inducible ischemia. EF was normal at rest. Patient had a left heart cath on 09/14/2016. Cath was significant for 75% prox ramus, 70% RPDA, 60% RPL and 75% mCx (small). Culprit lesion was a 90% prox/mid LAD lesion beginning at 1st septal and spans into mid segment. A 2.75 x 28 mm Promus stent was placed in the LAD and post dilated to 3.25 mm.  I personally reviewed cardiac cath films from [facility] on 09/14/16 and concur with the findings noted above.   He had a Myoview 03/15/18 and this showed normal perfusion and normal EF.    He is followed at the [facility] and by [facility].    In June 2023 he noted symptoms of DOE, dizziness and chest tightness. Ecg was abnormal and referred here. He subsequently underwent cardiac cath showing patent LAD stent. Moderate disease in LCx distribution and distal PL branch of RCA. Medical management recommended.    More recently he describes symptoms of increased SOB and chest pain over the past 2-3 months. Was evaluated at the [facility] study was done. This demonstrated a moderate sized reversible perfusion abnormality in the basal to mid inferior and inferolateral wall segments. Due to his symptoms and abnormal stress test repeat cardiac cath  recommended.      Past Medical History:  Diagnosis Date   Coronary artery disease    Diabetes mellitus type 2, noninsulin dependent (HCC)    GERD (gastroesophageal reflux disease)    High cholesterol    Hypertension    under control with med., has been on med. x 10 yr.   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   left shoulder, states unable to raise left arm   Myocardial infarction [facility])    Panic attacks    Rheumatoid arthritis (HCC)    Sleep apnea    Tenosynovitis of wrist 03/2013   right   Wears glasses     Past Surgical History:  Procedure Laterality Date   APPENDECTOMY  02/27/1978   BIOPSY  04/11/2018   Procedure: BIOPSY;  Surgeon: Corbin Ade, MD;  [facility];  Service: Endoscopy;;  esophagus   BIOPSY  07/21/2021   Procedure: BIOPSY;  Surgeon: Corbin Ade, MD;  [facility];  Service: Endoscopy;;   CARDIAC CATHETERIZATION  02/28/2000   1 stent placed in LAD   CARPAL TUNNEL RELEASE Left 07/29/2013   Procedure: LEFT CARPAL TUNNEL RELEASE;  Surgeon: Nicki Reaper, MD;  Location: [facility];  Service: Orthopedics;  Laterality: Left;   COLONOSCOPY N/A 01/12/2014   Procedure: COLONOSCOPY;  Surgeon: Corbin Ade, MD;  [facility];  Service: Endoscopy;  Laterality: N/A;  9:30 AM  COLONOSCOPY WITH PROPOFOL N/A 07/21/2021   Procedure: COLONOSCOPY WITH PROPOFOL;  Surgeon: Corbin Ade, MD;  [facility];  Service: Endoscopy;  Laterality: N/A;  10:30am   ESOPHAGOGASTRODUODENOSCOPY (EGD) WITH PROPOFOL N/A 04/11/2018   Procedure: ESOPHAGOGASTRODUODENOSCOPY (EGD) WITH PROPOFOL;  Surgeon: Corbin Ade, MD;  [facility];  Service: Endoscopy;  Laterality: N/A;  1:15pm   ESOPHAGOGASTRODUODENOSCOPY (EGD) WITH PROPOFOL N/A 07/21/2021   Procedure: ESOPHAGOGASTRODUODENOSCOPY (EGD) WITH PROPOFOL;  Surgeon: Corbin Ade, MD;  [facility];  Service: Endoscopy;  Laterality: N/A;   LEFT HEART  CATH AND CORONARY ANGIOGRAPHY N/A 08/09/2021   Procedure: LEFT HEART CATH AND CORONARY ANGIOGRAPHY;  Surgeon: Lyn Records, MD;  [facility]: [facility];  Service: Cardiovascular;  Laterality: N/A;   SHOULDER ARTHROSCOPY W/ ROTATOR CUFF REPAIR  05/28/2013   left-DSC   TENDON REPAIR Right    hand   TENOSYNOVECTOMY Right 04/08/2013   Procedure: TENOSYNOVECTOMY/FLEXOR TENDONS RIGHT WRIST;  Surgeon: Nicki Reaper, MD;  Location: [facility];  Service: Orthopedics;  Laterality: Right;     Medications Prior to Admission: Prior to Admission medications   Medication Sig Start Date End Date Taking? Authorizing Provider  amLODipine (NORVASC) 5 MG tablet Take 5 mg by mouth daily. 10/30/19  Yes [provider]  aspirin 81 MG chewable tablet Chew 81 mg by mouth daily. 04/25/14  Yes [provider]  atorvastatin (LIPITOR) 40 MG tablet Take 40 mg by mouth at bedtime.   Yes [provider]  cyclobenzaprine (FLEXERIL) 10 MG tablet Take 10 mg by mouth 3 (three) times daily as needed for muscle spasms. 04/20/21  Yes [provider]  empagliflozin (JARDIANCE) 25 MG TABS tablet Take 12.5 mg by mouth daily. 05/15/22  Yes [provider]  escitalopram (LEXAPRO) 10 MG tablet Take 10 mg by mouth daily. 08/24/22  Yes [provider]  ezetimibe (ZETIA) 10 MG tablet TAKE 1 TABLET (10 MG TOTAL) BY MOUTH DAILY. 09/01/22  Yes [place], Willis Holquin M, MD  hydrOXYzine (ATARAX) 25 MG tablet Take 25 mg by mouth 3 (three) times daily as needed for anxiety. 04/05/21  Yes [provider]  isosorbide mononitrate (IMDUR) 30 MG 24 hr tablet Take 30 mg by mouth daily.   Yes [provider]  losartan (COZAAR) 100 MG tablet Take 100 mg by mouth daily.   Yes [provider]  metFORMIN (GLUCOPHAGE-XR) 500 MG 24 hr tablet Take 500 mg by mouth in the morning and at bedtime.   Yes [provider]  metoprolol succinate (TOPROL-XL) 25 MG 24 hr tablet  TAKE 1 TABLET EVERY DAY 09/01/22  Yes [place], Raekwan Spelman M, MD  Mometasone Furoate [facility] HFA) 200 MCG/ACT AERO Inhale 2 puffs into the lungs daily.   Yes [provider]  Multiple Vitamin (ONE-A-DAY MENS PO) Take 1 tablet by mouth daily.    Yes [provider]  naproxen (NAPROSYN) 500 MG tablet Take 500 mg by mouth daily as needed for moderate pain (pain score 4-6). 05/15/22  Yes [provider]  Omega-3 Fatty Acids (FISH OIL) 1000 MG CAPS Take 1,000 mg by mouth 2 (two) times daily.   Yes [provider]  pantoprazole (PROTONIX) 40 MG tablet Take 40 mg by mouth daily. 10/30/19  Yes [provider]  Tiotropium Bromide Monohydrate 1.25 MCG/ACT AERS Take 2 puffs by mouth daily. 10/13/22  Yes [provider]  traZODone (DESYREL) 100 MG tablet Take 100 mg by mouth at bedtime.  Yes [provider]     Allergies:    Allergies  Allergen Reactions   Ativan [Lorazepam] Other (See Comments)    HYPERACTIVITY   Benazepril Cough    Social History:   Social History   Socioeconomic History   Marital status: Divorced    Spouse name: Not on file   Number of children: Not on file   Years of education: Not on file   Highest education level: Not on file  Occupational History   Not on file  Tobacco Use   Smoking status: Former    Current packs/day: 0.00    Types: Cigarettes    Quit date: 03/13/1998    Years since quitting: 24.9   Smokeless tobacco: Never  Vaping Use   Vaping status: Never Used  Substance and Sexual Activity   Alcohol use: Not Currently    Comment: once or twice a year   Drug use: No   Sexual activity: Yes    Partners: Female  Other Topics Concern   Not on file  Social History Narrative   Not on file   Social Drivers of Health   Financial Resource Strain: Not on file  Food Insecurity: Not on file  Transportation Needs: Not on file  Physical Activity: Not on file  Stress: Not on file  Social Connections: Not on  file  Intimate Partner Violence: Not on file    Family History:   The patient's family history includes Anesthesia problems in his sister; Epilepsy in his mother; Heart attack in his brother and father; Heart disease in his brother and father; Heart failure in his father; Multiple sclerosis in his sister; Parkinson's disease in his father and sister; Stroke in his brother. There is no history of Colon cancer, Gastric cancer, or Esophageal cancer.    ROS:  Please see the history of present illness.  All other ROS reviewed and negative.     Physical Exam/Data:   Vitals:   02/13/23 0650  BP: 107/64  Pulse: (!) 58  Resp: 17  Temp: (!) 97.5 F (36.4 C)  TempSrc: Oral  SpO2: 92%  Weight: 108 kg  Height: 5\' 6"  (1.676 m)   No intake or output data in the 24 hours ending 02/13/23 0852    02/13/2023    6:50 AM 09/25/2022    9:00 AM 09/16/2021    8:40 AM  Last 3 Weights  Weight (lbs) 238 lb 233 lb 6.4 oz 224 lb 9.6 oz  Weight (kg) 107.956 kg 105.87 kg 101.878 kg     Body mass index is 38.41 kg/m.  General:  Well nourished, well developed, in no acute distress HEENT: normal Neck: no JVD Vascular: No carotid bruits; Distal pulses 2+ bilaterally   Cardiac:  normal S1, S2; RRR; no murmur  Lungs:  clear to auscultation bilaterally, no wheezing, rhonchi or rales  Abd: soft, nontender, no hepatomegaly  Ext: no edema Musculoskeletal:  No deformities, BUE and BLE strength normal and equal Skin: warm and dry  Neuro:  CNs 2-12 intact, no focal abnormalities noted Psych:  Normal affect    EKG:  The ECG that was done today was personally reviewed and demonstrates sinus brady with first degree AV block. Possible old inferior infarct.   Relevant CV Studies: See H&P  Myoview 03/15/18: Study Highlights    Nuclear stress EF: 56%. The left ventricular ejection fraction is normal (55-65%). There was no ST segment deviation noted during stress. This is a low risk study. No evidence of  ischemia or previous infarction The study is normal.      Cardiac cath 08/09/21:  LEFT HEART CATH AND CORONARY ANGIOGRAPHY    Conclusion   CONCLUSIONS: Widely patent left main Patent LAD stent.  Beyond the stent diffuse calcified nonobstructive disease. Circumflex gives an early obtuse marginal LAD contains proximal segmental 70% narrowing.  The third marginal contains 70 to 80% stenosis in the mid vessel. The right coronary is large, heavily calcified, tortuous, and has a 50% globular nodule in the proximal to mid segment, high-grade obstruction in the proximal to mid PDA, eccentric 80% [place] 101 bifurcation stenosis with the first left ventricular branch, and 70% stenosis for the distal and the larger most distal left ventricular branch. Normal LV function and with elevated LVEDP.  Finding is consistent with diastolic heart failure. RECOMMENDATION:  Discussed with Dr. [place] Start anti-ischemic therapy Prescribed nitroglycerin Further management will depend upon symptoms.      Coronary Diagrams   Diagnostic Dominance: Right  Intervention     Laboratory Data:  High Sensitivity Troponin:  No results for input(s): "TROPONINIHS" in the last 720 hours.    Chemistry Recent Labs  Lab 02/13/23 0716  NA 136  K 3.8  CL 103  CO2 23  GLUCOSE 186*  BUN 15  CREATININE 0.92  CALCIUM 9.2  GFRNONAA >60  ANIONGAP 10    No results for input(s): "PROT", "ALBUMIN", "AST", "ALT", "ALKPHOS", "BILITOT" in the last 168 hours. Lipids No results for input(s): "CHOL", "TRIG", "HDL", "LABVLDL", "LDLCALC", "CHOLHDL" in the last 168 hours. Hematology Recent Labs  Lab 02/13/23 0716  WBC 8.8  RBC 4.80  HGB 15.0  HCT 43.8  MCV 91.3  MCH 31.3  MCHC 34.2  RDW 12.9  PLT 255   Thyroid No results for input(s): "TSH", "FREET4" in the last 168 hours. BNPNo results for input(s): "BNP", "PROBNP" in the last 168 hours.  DDimer No results for input(s): "DDIMER" in the last 168  hours.   Radiology/Studies:  No results found.   Assessment and Plan:   1.  CAD s/p NSTEMI in July 2018. Had DES of LAD. Moderate residual disease in multiple other branches. EF normal. On ASA. He did experience chest pain in November 2019 related to stress. Follow up Myoview was normal. Repeat cardiac cath showed patent LAD stent with other residual disease as noted. Now with increased dyspnea and chest pain over past 2-3 months. Myoview showed inferior and inferolateral ischemia. Continue ASA, amlodipine, Toprol, statin. Will proceed with cardiac cath. The procedure and risks were reviewed including but not limited to death, myocardial infarction, stroke, arrythmias, bleeding, transfusion, emergency surgery, dye allergy, or renal dysfunction. The patient voices understanding and is agreeable to proceed.    2. DM type 2 not on insulin. Per primary care.    3. Hypercholesterolemia. On lipitor 40 mg daily and Zetia 10 mg daily. Goal LDL < 70. Last LDL 62.    4. HTN controlled.    5. Obesity with OSA. On CPAP   6. RA on methotrexate     Risk Assessment/Risk Scores:          Code Status: Full Code      Signed, Macgregor Aeschliman [place], MD  02/13/2023 8:52 AM

## 2023-02-13 NOTE — Discharge Summary (Signed)
Discharge Summary for Same Day PCI   Patient ID: Greg Alvarez MRN: [id number]; DOB: 11/14/1960  Admit date: 02/13/2023 Discharge date: 02/13/2023  Primary Care Provider: Byrd Hesselbach, PA  Primary Cardiologist: Dr. Swaziland Primary Electrophysiologist:  None   Discharge Diagnoses    Principal Problem:   Unstable angina [facility]) Active Problems:   CAD (coronary artery disease)   Hypertension   Hyperlipidemia   Type 2 diabetes mellitus with obesity (HCC)   Obstructive sleep apnea    Diagnostic Studies/Procedures    Cardiac Catheterization 02/13/2023:   Mid LAD lesion is 25% stenosed.   1st Mrg lesion is 70% stenosed.   Prox RCA lesion is 95% stenosed.   RPAV lesion is 100% stenosed.   RPDA lesion is 80% stenosed.   Prox Cx lesion is 80% stenosed.   A drug-eluting stent was successfully placed using a SYNERGY XD 4.0X20.   Balloon angioplasty was performed using a BALLN [place] EMERGE MR 2.5X12.   Post intervention, there is a 0% residual stenosis.   Post intervention, there is a 70% residual stenosis.   Post intervention, there is a 0% residual stenosis.   The left ventricular systolic function is normal.   LV end diastolic pressure is normal.   The left ventricular ejection fraction is 55-65% by visual estimate.   Recommend uninterrupted dual antiplatelet therapy with Aspirin 81mg  daily and Clopidogrel 75mg  daily for a minimum of 12 months (ACS-Class I recommendation).   2 vessel obstructive CAD. Continued patency of stent in the LAD. Disease in LCx is unchanged from prior angiogram and these branches are small in caliber. New high grade stenosis in the proximal to mid RCA. New occlusion of PL branch with left to right collaterals. Moderate obstructive disease in the PDA Good LV function with inferobasal HK. EF 55-60% Normal LVEDP Successful PCI and stenting of the proximal to mid RCA with DES Unsuccessful PCI of the PDA. Lesion treated with balloon angioplasty and  Shockwave lithotripsy with rigid unexpanding lesion Unsuccessful PCI of the PL branch of the RCA. This vessel is occluded and unable to cross with a wire. It is collateralized.    Plan: DAPT for one year. Optimize medical therapy. I think he will note significant improvement in his symptoms. If he has refractory limiting angina could consider repeat PCI of the PDA with atherectomy.  Diagnostic Dominance: Right  Intervention     _____________   History of Present Illness     Greg Alvarez is a 62 y.o. male with a history of CAD s/p DES to LAD in 08/2016, hypertension, hyperlipidemia, type 2 diabetes mellitus, and obstructive sleep apnea who is followed by Cardiology at the [facility] as well as Dr. Swaziland.  He was recently seen by Cardiology at the [facility] and reported intermittent chest tightness, shortness of breath, and left arm. He reported these symptoms were exacerbated with exertion and improved with rest and sublingual Nitroglycerin. He underwent a Lexiscan Myoview on 01/03/2023 which showed a medium sized, moderate intensity, full reversible perfusion defect involving the basal to mid inferior and inferolateral segments. Therefore, he was referred to Dr. Swaziland for cardiac catheterization.  Hospital Course     Patient presented to [facility] on 02/13/2023 for planned cardiac catheterization. LHC showed patent stent in LAD with only 25% in-stent restenosis, 70% stenosis of OM1, 95% of proximal RCA, 100% stenosis of RPAV, 80% stenosis of RPDA, and 80% stenosis of proximal LCX. He underwent successful PCI with DES to proximal to mid RCA.  Stenting of PDA was attempted but was unsuccessful; however, this lesion was treated with balloon angioplasty and shockwave lithotripsy. Plan is for DAPT with Aspirin 81mg  daily and Plavix 75mg  daily for at least 12 months. If he has refractory limiting angina, could consider repeat PCI of the PDA with atherectomy. The patient was seen by Cardiac Rehab while in short  stay. There were no observed complications post cath. Right radial cath site was re-evaluated prior to discharge and found to be stable without any complications. Instructions/precautions regarding cath site care were given prior to discharge.  LANCE MONTFORD was seen by Dr. Swaziland and determined stable for discharge home. Follow up with our office has been arranged. Medications are listed below. Pertinent changes include addition of Plavix. Recommended avoiding NSAIDs (discontinued home Naproxen) and recommended taking OTC Acetaminophen as needed for pain. Can restart Metformin 48 hours after cardiac catheterization (02/16/2023). Patient is a [employer]. Will provide him a work note for 1 week and asked him to make sure he notifies his [employer] rep of his PCI. _____________  Cath/PCI Registry Performance & Quality Measures: Aspirin prescribed? - Yes ADP Receptor Inhibitor (Plavix/Clopidogrel, Brilinta/Ticagrelor or Effient/Prasugrel) prescribed (includes medically managed patients)? - Yes High Intensity Statin (Lipitor 40-80mg  or Crestor 20-40mg ) prescribed? - Yes For EF <40%, was ACEI/ARB prescribed? - Not Applicable (EF >/= 40%) For EF <40%, Aldosterone Antagonist (Spironolactone or Eplerenone) prescribed? - Not Applicable (EF >/= 40%) Cardiac Rehab Phase II ordered (Included Medically managed Patients)? - Yes  _____________   Discharge Vitals Blood pressure 127/79, pulse (!) 57, temperature (!) 97.5 F (36.4 C), temperature source Oral, resp. rate 20, height 5\' 6"  (1.676 m), weight 108 kg, SpO2 94%.  Filed Weights   02/13/23 0650  Weight: 108 kg    Last Labs & Radiologic Studies    CBC Recent Labs    02/13/23 0716  WBC 8.8  HGB 15.0  HCT 43.8  MCV 91.3  PLT 255   Basic Metabolic Panel Recent Labs    72/53/66 0716  NA 136  K 3.8  CL 103  CO2 23  GLUCOSE 186*  BUN 15  CREATININE 0.92  CALCIUM 9.2   Liver Function Tests No results for input(s): "AST", "ALT", "ALKPHOS",  "BILITOT", "PROT", "ALBUMIN" in the last 72 hours. No results for input(s): "LIPASE", "AMYLASE" in the last 72 hours. High Sensitivity Troponin:   No results for input(s): "TROPONINIHS" in the last 720 hours.  BNP Invalid input(s): "POCBNP" D-Dimer No results for input(s): "DDIMER" in the last 72 hours. Hemoglobin A1C No results for input(s): "HGBA1C" in the last 72 hours. Fasting Lipid Panel No results for input(s): "CHOL", "HDL", "LDLCALC", "TRIG", "CHOLHDL", "LDLDIRECT" in the last 72 hours. Thyroid Function Tests No results for input(s): "TSH", "T4TOTAL", "T3FREE", "THYROIDAB" in the last 72 hours.  Invalid input(s): "FREET3" _____________  CARDIAC CATHETERIZATION Result Date: 02/13/2023   Mid LAD lesion is 25% stenosed.   1st Mrg lesion is 70% stenosed.   Prox RCA lesion is 95% stenosed.   RPAV lesion is 100% stenosed.   RPDA lesion is 80% stenosed.   Prox Cx lesion is 80% stenosed.   A drug-eluting stent was successfully placed using a SYNERGY XD 4.0X20.   Balloon angioplasty was performed using a BALLN [place] EMERGE MR 2.5X12.   Post intervention, there is a 0% residual stenosis.   Post intervention, there is a 70% residual stenosis.   Post intervention, there is a 0% residual stenosis.   The left ventricular systolic  function is normal.   LV end diastolic pressure is normal.   The left ventricular ejection fraction is 55-65% by visual estimate.   Recommend uninterrupted dual antiplatelet therapy with Aspirin 81mg  daily and Clopidogrel 75mg  daily for a minimum of 12 months (ACS-Class I recommendation). 2 vessel obstructive CAD. Continued patency of stent in the LAD. Disease in LCx is unchanged from prior angiogram and these branches are small in caliber. New high grade stenosis in the proximal to mid RCA. New occlusion of PL branch with left to right collaterals. Moderate obstructive disease in the PDA Good LV function with inferobasal HK. EF 55-60% Normal LVEDP Successful PCI and stenting of  the proximal to mid RCA with DES Unsuccessful PCI of the PDA. Lesion treated with balloon angioplasty and Shockwave lithotripsy with rigid unexpanding lesion Unsuccessful PCI of the PL branch of the RCA. This vessel is occluded and unable to cross with a wire. It is collateralized. Plan: DAPT for one year. Optimize medical therapy. I think he will note significant improvement in his symptoms. If he has refractory limiting angina could consider repeat PCI of the PDA with atherectomy    Disposition   Patient is being discharged home today in good condition.  Follow-up Plans & Appointments     Follow-up Information     Sw[place], Peter M, MD Follow up.   Specialty: Cardiology Why: Follow-up with Cardiology scheduled for 03/12/2022 at 9:40am. Please arrive 15 minutes early for check-in. If this date/ time does not work for you, please call our office to reschedule. Contact information: [address] [place] [address] [phone]                Discharge Instructions     Amb Referral to Cardiac Rehabilitation   Complete by: As directed    Diagnosis: Coronary Stents   After initial evaluation and assessments completed: Virtual Based Care may be provided alone or in conjunction with Phase 2 Cardiac Rehab based on patient barriers.: Yes   Intensive Cardiac Rehabilitation (ICR) [facility] location only OR Traditional Cardiac Rehabilitation (TCR) *If criteria for ICR are not met will enroll in TCR [facility] only): Yes        Discharge Medications   Allergies as of 02/13/2023       Reactions   Ativan [lorazepam] Other (See Comments)   HYPERACTIVITY   Benazepril Cough        Medication List     STOP taking these medications    naproxen 500 MG tablet Commonly known as: NAPROSYN       TAKE these medications    amLODipine 5 MG tablet Commonly known as: NORVASC Take 5 mg by mouth daily.   Asmanex HFA 200 MCG/ACT Aero Generic drug: Mometasone Furoate Inhale 2  puffs into the lungs daily.   aspirin 81 MG chewable tablet Chew 81 mg by mouth daily.   atorvastatin 40 MG tablet Commonly known as: LIPITOR Take 40 mg by mouth at bedtime.   clopidogrel 75 MG tablet Commonly known as: PLAVIX Take 1 tablet (75 mg total) by mouth daily with breakfast. Start taking on: February 14, 2023   cyclobenzaprine 10 MG tablet Commonly known as: FLEXERIL Take 10 mg by mouth 3 (three) times daily as needed for muscle spasms.   empagliflozin 25 MG Tabs tablet Commonly known as: JARDIANCE Take 12.5 mg by mouth daily.   escitalopram 10 MG tablet Commonly known as: LEXAPRO Take 10 mg by mouth daily.   ezetimibe 10 MG tablet  Commonly known as: ZETIA TAKE 1 TABLET (10 MG TOTAL) BY MOUTH DAILY.   Fish Oil 1000 MG Caps Take 1,000 mg by mouth 2 (two) times daily.   hydrOXYzine 25 MG tablet Commonly known as: ATARAX Take 25 mg by mouth 3 (three) times daily as needed for anxiety.   isosorbide mononitrate 30 MG 24 hr tablet Commonly known as: IMDUR Take 30 mg by mouth daily.   losartan 100 MG tablet Commonly known as: COZAAR Take 100 mg by mouth daily.   metFORMIN 500 MG 24 hr tablet Commonly known as: GLUCOPHAGE-XR Take 500 mg by mouth in the morning and at bedtime.   metoprolol succinate 25 MG 24 hr tablet Commonly known as: TOPROL-XL TAKE 1 TABLET EVERY DAY   nitroGLYCERIN 0.4 MG SL tablet Commonly known as: NITROSTAT Place 1 tablet (0.4 mg total) under the tongue every 5 (five) minutes as needed for chest pain.   ONE-A-DAY MENS PO Take 1 tablet by mouth daily.   pantoprazole 40 MG tablet Commonly known as: PROTONIX Take 40 mg by mouth daily.   Tiotropium Bromide Monohydrate 1.25 MCG/ACT Aers Take 2 puffs by mouth daily.   traZODone 100 MG tablet Commonly known as: DESYREL Take 100 mg by mouth at bedtime.           Allergies Allergies  Allergen Reactions   Ativan [Lorazepam] Other (See Comments)    HYPERACTIVITY    Benazepril Cough    Outstanding Labs/Studies   N/A  Duration of Discharge Encounter   Greater than 30 minutes including physician time.  Signed, Corrin Parker, PA-C 02/13/2023, 3:34 PM

## 2023-02-13 NOTE — Interval H&P Note (Signed)
History and Physical Interval Note:  02/13/2023 9:04 AM  Greg Alvarez  has presented today for surgery, with the diagnosis of positive stress test.  The various methods of treatment have been discussed with the patient and family. After consideration of risks, benefits and other options for treatment, the patient has consented to  Procedure(s): LEFT HEART CATH AND CORONARY ANGIOGRAPHY (N/A) as a surgical intervention.  The patient's history has been reviewed, patient examined, no change in status, stable for surgery.  I have reviewed the patient's chart and labs.  Questions were answered to the patient's satisfaction.   Cath Lab Visit (complete for each Cath Lab visit)  Clinical Evaluation Leading to the Procedure:   ACS: No.  Non-ACS:    Anginal Classification: CCS III  Anti-ischemic medical therapy: Maximal Therapy (2 or more classes of medications)  Non-Invasive Test Results: Intermediate-risk stress test findings: cardiac mortality 1-3%/year  Prior CABG: No previous CABG        Theron Arista [facility] 02/13/2023 9:04 AM

## 2023-02-14 ENCOUNTER — Encounter (HOSPITAL_COMMUNITY): Payer: Self-pay | Admitting: Cardiology

## 2023-02-15 ENCOUNTER — Telehealth (HOSPITAL_COMMUNITY): Payer: Self-pay

## 2023-02-15 ENCOUNTER — Other Ambulatory Visit: Payer: Self-pay

## 2023-02-15 MED ORDER — CLOPIDOGREL BISULFATE 75 MG PO TABS: 75.0000 mg | ORAL_TABLET | Freq: Every day | ORAL | 1 refills | Status: DC

## 2023-02-15 NOTE — Telephone Encounter (Signed)
Called patient regarding his referral to cardiac rehab with stent placement. He says he is not interested in participating in the program at this time.

## 2023-02-27 ENCOUNTER — Telehealth: Payer: Self-pay | Admitting: Cardiology

## 2023-02-27 NOTE — Telephone Encounter (Signed)
Left vm regarding ST-Disability paperwork - 2 forms needing completion in office (ROI & Billing) + $29 processing fee (cash, check, or money order). Pt has appt on 1/14 & can complete information at time of visit, if more convenient.  JB, 02-27-23

## 2023-03-07 NOTE — Progress Notes (Signed)
 Cardiology Office Note    Date:  03/13/2023   ID:  [facility], DOB 09-Dec-1960, MRN [id number]  PCP:  Dasie Perkins, PA  Cardiologist:  Jaren Vanetten, MD   Chief Complaint  Patient presents with   Coronary Artery Disease       History of Present Illness: [facility] is a 63 y.o. male who is seen for follow up CAD. He has a history of HTN, HLD, OSA and DM. He presented in 7/18 to [facility] for evaluation of an episode of dizziness, shortness of breath, chest tightness, and aphasia. His troponin was negative and a stress echocardiogram was ordered. His stress test was positive for inducible ischemia. EF was normal at rest. Patient had a left heart cath on 09/14/2016. Cath was significant for 75% prox ramus, 70% RPDA, 60% RPL and 75% mCx (small). Culprit lesion was a 90% prox/mid LAD lesion beginning at 1st septal and spans into mid segment. A 2.75 x 28 mm Promus stent was placed in the LAD and post dilated to 3.25 mm.  I personally reviewed cardiac cath films from [facility] on 09/14/16 and concur with the findings noted above.  He had a Myoview  03/15/18 and this showed normal perfusion and normal EF.   He is followed at the [facility] and by [facility].   In June 2023 he noted symptoms of DOE, dizziness and chest tightness. Ecg was abnormal and referred here. He subsequently underwent cardiac cath showing patent LAD stent. Moderate disease in LCx distribution and distal PL branch of RCA. Medical management recommended.   In Dec he presented with 2-3 month history of worsening chest pain and dyspnea. He had a nuclear stress test at the [facility] showing inferior and inferolateral ischemia. Repeat cardiac cath showed high grade stenosis in the proximal RCA treated with DES. Also the PL branch was occluded and there was a significant stenosis in the PDA. Unable to cross the PL occlusion with a wire. Attempted to treat the PDA but even with high pressure POBA and Shockwave lithotripsy we were unable to  expand the lesion. The LAD stent was patent. Moderate disease in small caliber LCx was unchanged.   Since this procedure he states he has not had any chest pain at all. Notes his breathing is much better. Apparently had a CT chest done at [facility] and was placed on steroid taper. No radial access issues. Primary care is doing A1c today.   Past Medical History:  Diagnosis Date   Coronary artery disease    Diabetes mellitus type 2, noninsulin dependent (HCC)    GERD (gastroesophageal reflux disease)    High cholesterol    Hypertension    under control with med., has been on med. x 10 yr.   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   left shoulder, states unable to raise left arm   Myocardial infarction [facility])    Panic attacks    Rheumatoid arthritis (HCC)    Sleep apnea    Tenosynovitis of wrist 03/2013   right   Wears glasses     Past Surgical History:  Procedure Laterality Date   APPENDECTOMY  02/27/1978   BIOPSY  04/11/2018   Procedure: BIOPSY;  Surgeon: Shaaron [facility] HERO, MD;  [facility];  Service: Endoscopy;;  esophagus   BIOPSY  07/21/2021   Procedure: BIOPSY;  Surgeon: Shaaron [facility] HERO, MD;  [facility];  Service: Endoscopy;;   CARDIAC CATHETERIZATION  02/28/2000   1 stent  placed in LAD   CARPAL TUNNEL RELEASE Left 07/29/2013   Procedure: LEFT CARPAL TUNNEL RELEASE;  Surgeon: Arley JONELLE Curia, MD;  Location: [facility];  Service: Orthopedics;  Laterality: Left;   COLONOSCOPY N/A 01/12/2014   Procedure: COLONOSCOPY;  Surgeon: [facility] CHRISTELLA Hollingshead, MD;  [facility];  Service: Endoscopy;  Laterality: N/A;  9:30 AM   COLONOSCOPY WITH PROPOFOL  N/A 07/21/2021   Procedure: COLONOSCOPY WITH PROPOFOL ;  Surgeon: [facility] CHRISTELLA, MD;  [facility];  Service: Endoscopy;  Laterality: N/A;  10:30am   CORONARY LITHOTRIPSY N/A 02/13/2023   Procedure: CORONARY LITHOTRIPSY;  Surgeon: Lexine Jaspers M, MD;  [facility];  Service:  Cardiovascular;  Laterality: N/A;   CORONARY STENT INTERVENTION N/A 02/13/2023   Procedure: CORONARY STENT INTERVENTION;  Surgeon: Quyen Cutsforth M, MD;  [facility];  Service: Cardiovascular;  Laterality: N/A;   ESOPHAGOGASTRODUODENOSCOPY (EGD) WITH PROPOFOL  N/A 04/11/2018   Procedure: ESOPHAGOGASTRODUODENOSCOPY (EGD) WITH PROPOFOL ;  Surgeon: [facility] CHRISTELLA, MD;  [facility];  Service: Endoscopy;  Laterality: N/A;  1:15pm   ESOPHAGOGASTRODUODENOSCOPY (EGD) WITH PROPOFOL  N/A 07/21/2021   Procedure: ESOPHAGOGASTRODUODENOSCOPY (EGD) WITH PROPOFOL ;  Surgeon: [facility] CHRISTELLA, MD;  [facility];  Service: Endoscopy;  Laterality: N/A;   LEFT HEART CATH AND CORONARY ANGIOGRAPHY N/A 08/09/2021   Procedure: LEFT HEART CATH AND CORONARY ANGIOGRAPHY;  Surgeon: Claudene Victory ORN, MD;  Location: [facility];  Service: Cardiovascular;  Laterality: N/A;   LEFT HEART CATH AND CORONARY ANGIOGRAPHY N/A 02/13/2023   Procedure: LEFT HEART CATH AND CORONARY ANGIOGRAPHY;  Surgeon: Keeven Matty M, MD;  [facility];  Service: Cardiovascular;  Laterality: N/A;   SHOULDER ARTHROSCOPY W/ ROTATOR CUFF REPAIR  05/28/2013   left-DSC   TENDON REPAIR Right    hand   TENOSYNOVECTOMY Right 04/08/2013   Procedure: TENOSYNOVECTOMY/FLEXOR TENDONS RIGHT WRIST;  Surgeon: Arley JONELLE Curia, MD;  Location: [facility];  Service: Orthopedics;  Laterality: Right;     Current Outpatient Medications  Medication Sig Dispense Refill   amLODipine  (NORVASC ) 5 MG tablet Take 5 mg by mouth daily.     aspirin  81 MG chewable tablet Chew 81 mg by mouth daily.     atorvastatin  (LIPITOR) 40 MG tablet Take 40 mg by mouth at bedtime.     clopidogrel  (PLAVIX ) 75 MG tablet Take 1 tablet (75 mg total) by mouth daily with breakfast. 90 tablet 1   cyclobenzaprine (FLEXERIL) 10 MG tablet Take 10 mg by mouth 3 (three) times daily as needed for muscle spasms.     empagliflozin  (JARDIANCE) 25 MG TABS tablet Take 12.5 mg by mouth daily.     escitalopram (LEXAPRO) 10 MG tablet Take 10 mg by mouth daily.     ezetimibe  (ZETIA ) 10 MG tablet TAKE 1 TABLET (10 MG TOTAL) BY MOUTH DAILY. 90 tablet 3   hydrOXYzine  (ATARAX ) 25 MG tablet Take 25 mg by mouth 3 (three) times daily as needed for anxiety.     isosorbide  mononitrate (IMDUR ) 30 MG 24 hr tablet Take 30 mg by mouth daily.     losartan  (COZAAR ) 100 MG tablet Take 100 mg by mouth daily.     metFORMIN (GLUCOPHAGE-XR) 500 MG 24 hr tablet Take 500 mg by mouth in the morning and at bedtime.     metoprolol  succinate (TOPROL -XL) 25 MG 24 hr tablet TAKE 1 TABLET EVERY DAY 90 tablet 3   Mometasone  Furoate (ASMANEX HFA) 200 MCG/ACT AERO Inhale 2 puffs into the lungs daily.     Multiple Vitamin (ONE-A-DAY MENS PO) Take 1 tablet by mouth daily.      nitroGLYCERIN  (NITROSTAT ) 0.4 MG SL tablet Place 1 tablet (0.4 mg total) under the tongue every 5 (five) minutes as needed for chest pain. 25 tablet 2   Omega-3 Fatty Acids (FISH OIL) 1000 MG CAPS Take 1,000 mg by mouth 2 (two) times daily.     pantoprazole  (PROTONIX ) 40 MG tablet Take 40 mg by mouth daily.     Tiotropium Bromide Monohydrate 1.25 MCG/ACT AERS Take 2 puffs by mouth daily.     traZODone  (DESYREL ) 100 MG tablet Take 100 mg by mouth at bedtime.     No current facility-administered medications for this visit.    Allergies:   Ativan [lorazepam] and Benazepril    Social History:  The patient  reports that he quit smoking about 25 years ago. His smoking use included cigarettes. He has never used smokeless tobacco. He reports that he does not currently use alcohol. He reports that he does not use drugs.   Family History:  The patient's family history includes Anesthesia problems in his sister; Epilepsy in his mother; Heart attack in his brother and father; Heart disease in his brother and father; Heart failure in his father; Multiple sclerosis in his sister; Parkinson's disease  in his father and sister; Stroke in his brother.    ROS:  Please see the history of present illness.   Otherwise, review of systems are positive for .   All other systems are reviewed and negative.    PHYSICAL EXAM: VS:  BP 130/78   Pulse 64   Ht 5' 6 (1.676 m)   Wt 231 lb 9.6 oz (105.1 kg)   SpO2 94%   BMI 37.38 kg/m  , BMI Body mass index is 37.38 kg/m. GENERAL:  Well appearing WM in NAD HEENT:  PERRL, EOMI, sclera are clear. Oropharynx is clear. NECK:  No jugular venous distention, carotid upstroke brisk and symmetric, no bruits, no thyromegaly or adenopathy LUNGS:  Clear to auscultation bilaterally CHEST:  Unremarkable HEART:  RRR,  PMI not displaced or sustained,S1 and S2 within normal limits, no S3, no S4: no clicks, no rubs, no murmurs ABD:  Soft, nontender. BS +, no masses or bruits. No hepatomegaly, no splenomegaly EXT:  2 + pulses throughout, no edema, no cyanosis no clubbing SKIN:  Warm and dry.  No rashes NEURO:  Alert and oriented x 3. Cranial nerves II through XII intact. PSYCH:  Cognitively intact        Recent Labs: 02/13/2023: BUN 15; Creatinine, Ser 0.92; Hemoglobin 15.0; Platelets 255; Potassium 3.8; Sodium 136    Lipid Panel    Component Value Date/Time   CHOL 130 11/14/2021 1007   TRIG 190 (H) 11/14/2021 1007   HDL 36 (L) 11/14/2021 1007   CHOLHDL 3.6 11/14/2021 1007   CHOLHDL 8.3 03/11/2008 0540   VLDL 50 (H) 03/11/2008 0540   LDLCALC 62 11/14/2021 1007   Labs dated 12/17/13: WBC 10.6. platelets 416K. Hgb 14.3. Cholesterol 295, triglycerides 281, HDL 35, LDL 204. Aic 6.4%. creatinine normal.     Labs dated 09/15/16: glucose 160. Other chemistries normal. Cholesterol 150, triglycerides 314, HDL 27, LDL 86. Hgb 13.5.   Labs from [facility] in July: cholesterol 102, triglycerides 88, HDL 32, LDL 52.   Wt Readings from Last 3 Encounters:  03/13/23 231 lb 9.6 oz (105.1 kg)  02/13/23 238 lb (108 kg)  09/25/22 233 lb 6.4 oz (105.9 kg)      Other  studies Reviewed: Additional studies/ records that were reviewed today include:   Myoview  03/15/18: Study Highlights   Nuclear stress EF: 56%. The left ventricular ejection fraction is normal (55-65%). There was no ST segment deviation noted during stress. This is a low risk study. No evidence of ischemia or previous infarction The study is normal.    Cardiac cath 08/09/21:  LEFT HEART CATH AND CORONARY ANGIOGRAPHY   Conclusion  CONCLUSIONS: Widely patent left main Patent LAD stent.  Beyond the stent diffuse calcified nonobstructive disease. Circumflex gives an early obtuse marginal LAD contains proximal segmental 70% narrowing.  The third marginal contains 70 to 80% stenosis in the mid vessel. The right coronary is large, heavily calcified, tortuous, and has a 50% globular nodule in the proximal to mid segment, high-grade obstruction in the proximal to mid PDA, eccentric 80% [place] 101 bifurcation stenosis with the first left ventricular branch, and 70% stenosis for the distal and the larger most distal left ventricular branch. Normal LV function and with elevated LVEDP.  Finding is consistent with diastolic heart failure. RECOMMENDATION:  Discussed with Dr. Anna-Marie Coller Start anti-ischemic therapy Prescribed nitroglycerin  Further management will depend upon symptoms.      Coronary Diagrams  Diagnostic Dominance: Right  Intervention  Cardiac cath 02/13/23:  LEFT HEART CATH AND CORONARY ANGIOGRAPHY  CORONARY STENT INTERVENTION  CORONARY LITHOTRIPSY   Conclusion      Mid LAD lesion is 25% stenosed.   1st Mrg lesion is 70% stenosed.   Prox RCA lesion is 95% stenosed.   RPAV lesion is 100% stenosed.   RPDA lesion is 80% stenosed.   Prox Cx lesion is 80% stenosed.   A drug-eluting stent was successfully placed using a SYNERGY XD 4.0X20.   Balloon angioplasty was performed using a BALLN [place] EMERGE MR 2.5X12.   Post intervention, there is a 0% residual stenosis.   Post  intervention, there is a 70% residual stenosis.   Post intervention, there is a 0% residual stenosis.   The left ventricular systolic function is normal.   LV end diastolic pressure is normal.   The left ventricular ejection fraction is 55-65% by visual estimate.   Recommend uninterrupted dual antiplatelet therapy with Aspirin  81mg  daily and Clopidogrel  75mg  daily for a minimum of 12 months (ACS-Class I recommendation).   2 vessel obstructive CAD. Continued patency of stent in the LAD. Disease in LCx is unchanged from prior angiogram and these branches are small in caliber. New high grade stenosis in the proximal to mid RCA. New occlusion of PL branch with left to right collaterals. Moderate obstructive disease in the PDA Good LV function with inferobasal HK. EF 55-60% Normal LVEDP Successful PCI and stenting of the proximal to mid RCA with DES Unsuccessful PCI of the PDA. Lesion treated with balloon angioplasty and Shockwave lithotripsy with rigid unexpanding lesion Unsuccessful PCI of the PL branch of the RCA. This vessel is occluded and unable to cross with a wire. It is collateralized.    Plan: DAPT for one year. Optimize medical therapy. I think he will note significant improvement in his symptoms. If he has refractory limiting angina could consider repeat PCI of the PDA with atherectomy   ASSESSMENT AND PLAN:  1.  CAD s/p NSTEMI in July 2018. Had DES of LAD. Moderate residual disease in multiple other branches.  More recently had accelerated angina and abnormal Myoview  at  the [place]. Underwent cardiac cath in Dec showing patent LAD stent. Moderate LCx disease stable. Severe proximal RCA disease. Mod- severe PDA disease and occluded PL branch. Had successful PCI of the proximal RCA with DES. Unable to expand PDA despite Shockwave lithotripsy. Unable to cross the PL branch with wire. Clinically he is much better. Continue medical therapy for residual disease.  Continue ASA, Plavix  for one year.  Continue amlodipine , Toprol , statin. 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   2. DM type 2 not on insulin . Per primary care.   3. Hypercholesterolemia. On lipitor 40 mg daily and Zetia  10 mg daily. Goal LDL < 55. Last LDL 52.   4. HTN controlled.   5. Obesity with OSA. On CPAP  6. RA no longer on methotrexate    Disposition: follow up in 6 months.  Signed, Jaspreet Bodner, MD  03/13/2023 10:00 AM    [facility] [address], [place], [place], [address] Phone [phone], Fax [phone]

## 2023-03-13 ENCOUNTER — Encounter: Payer: Self-pay | Admitting: Cardiology

## 2023-03-13 ENCOUNTER — Ambulatory Visit: Payer: No Typology Code available for payment source | Attending: Cardiology | Admitting: Cardiology

## 2023-03-13 VITALS — BP 130/78 | HR 64 | Ht 66.0 in | Wt 231.6 lb

## 2023-03-13 DIAGNOSIS — I10 Essential (primary) hypertension: Secondary | ICD-10-CM

## 2023-03-13 DIAGNOSIS — I25118 Atherosclerotic heart disease of native coronary artery with other forms of angina pectoris: Secondary | ICD-10-CM | POA: Diagnosis not present

## 2023-03-13 DIAGNOSIS — E119 Type 2 diabetes mellitus without complications: Secondary | ICD-10-CM | POA: Diagnosis not present

## 2023-03-13 DIAGNOSIS — E7800 Pure hypercholesterolemia, unspecified: Secondary | ICD-10-CM

## 2023-03-13 NOTE — Patient Instructions (Addendum)
 Medication Instructions:  NO changes  *If you need a refill on your cardiac medications before your next appointment, please call your pharmacy*   Follow-Up: At [facility], you and your health needs are our priority.  As part of our continuing mission to provide you with exceptional heart care, we have created designated Provider Care Teams.  These Care Teams include your primary Cardiologist (physician) and Advanced Practice Providers (APPs -  Physician Assistants and Nurse Practitioners) who all work together to provide you with the care you need, when you need it.   Your next appointment:   6 month(s)  Provider:   Dr. Jordan

## 2023-03-14 NOTE — Telephone Encounter (Signed)
 Spoke w/pt in regards to [facility] Dis. Paperwork - pt states he has went back to work & the company denied his claim due to starting work sooner than the allotted time out. Pt states nothing further is needed in regards to ST Dis.  I will contact pt in regards to refund of $29 fee (cash).  JB, 03-14-23

## 2023-03-16 NOTE — Telephone Encounter (Signed)
Lvm for pt to pick up [facility] payment refund. Left in envelope in pickup bin at front desk.  JB, 03-16-23

## 2023-03-29 ENCOUNTER — Ambulatory Visit: Payer: Medicare HMO | Admitting: Cardiology

## 2023-04-06 ENCOUNTER — Encounter: Payer: Self-pay | Admitting: Cardiology

## 2023-04-09 NOTE — Telephone Encounter (Signed)
 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

## 2023-04-24 IMAGING — CR DG CHEST 2V
2 series · 2 of 2 positions shown · non-contrast
Comparison: Chest x-ray dated February 26, 2007

CLINICAL DATA: Chest pain

EXAM:
CHEST - 2 VIEW

[w pa chest]
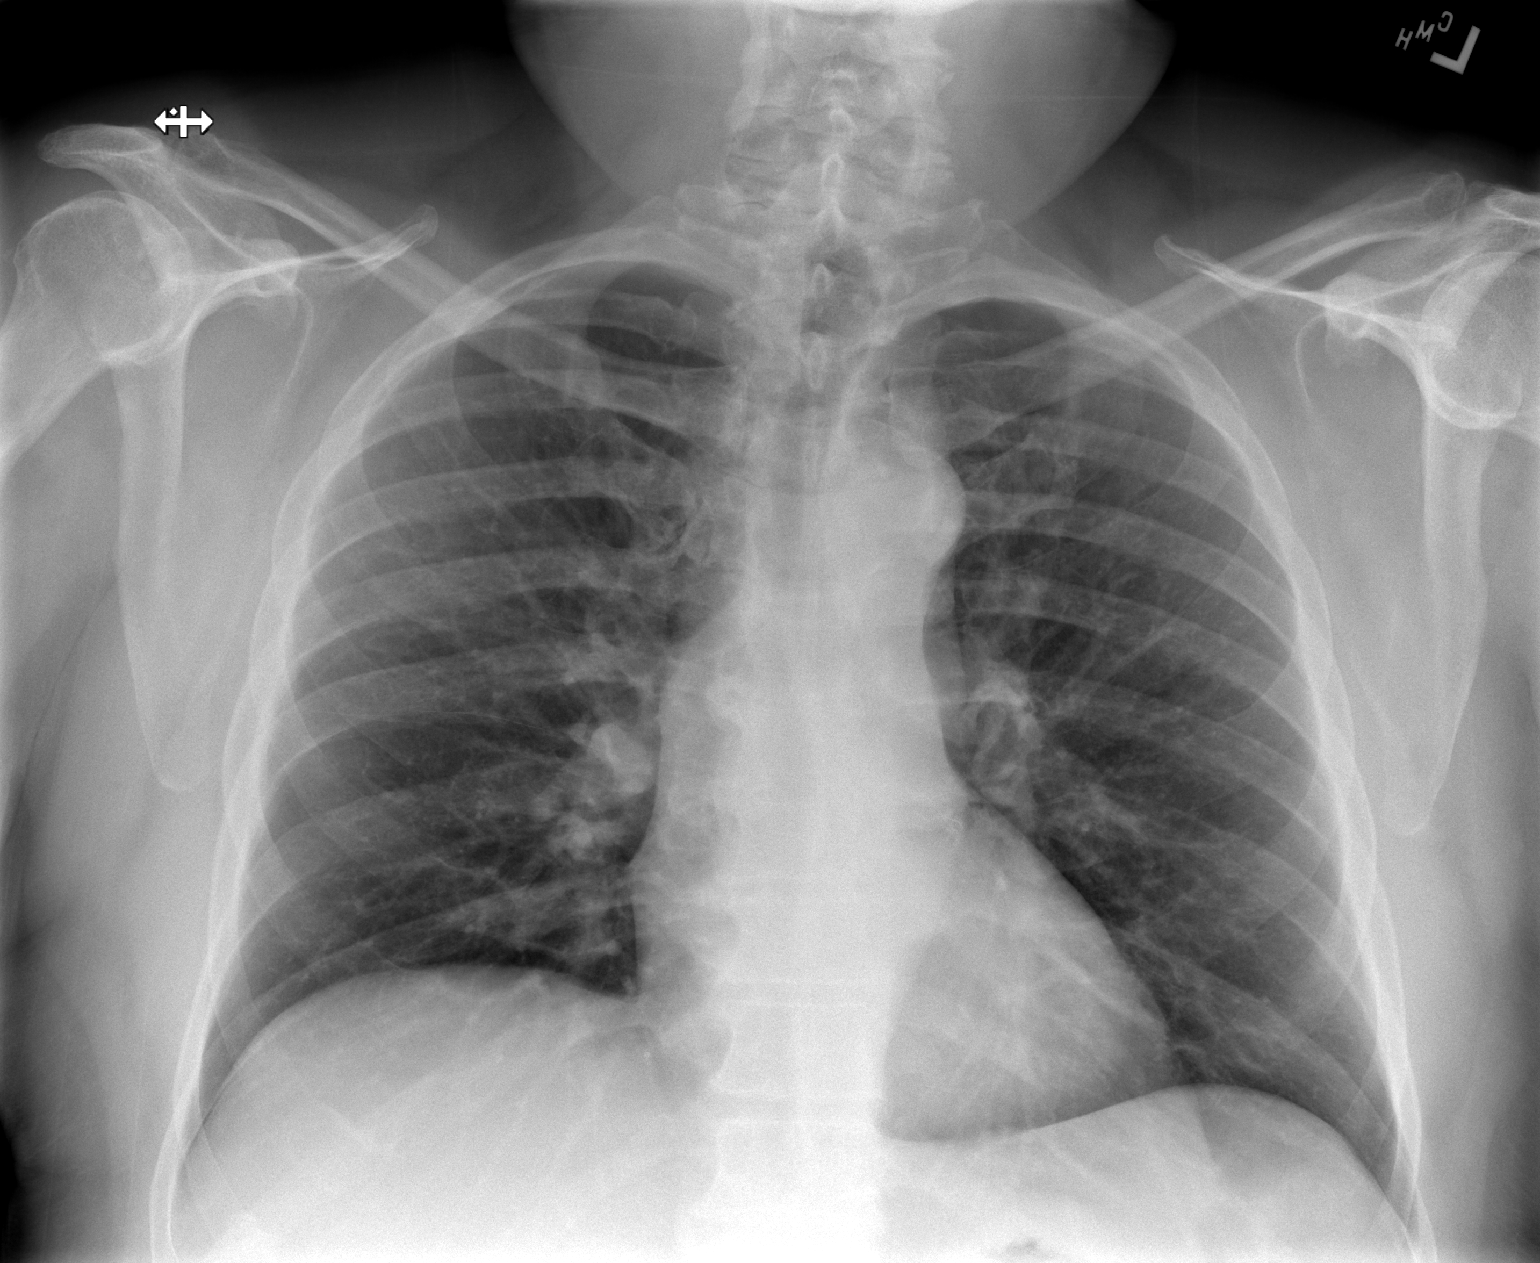

[w chest lat]
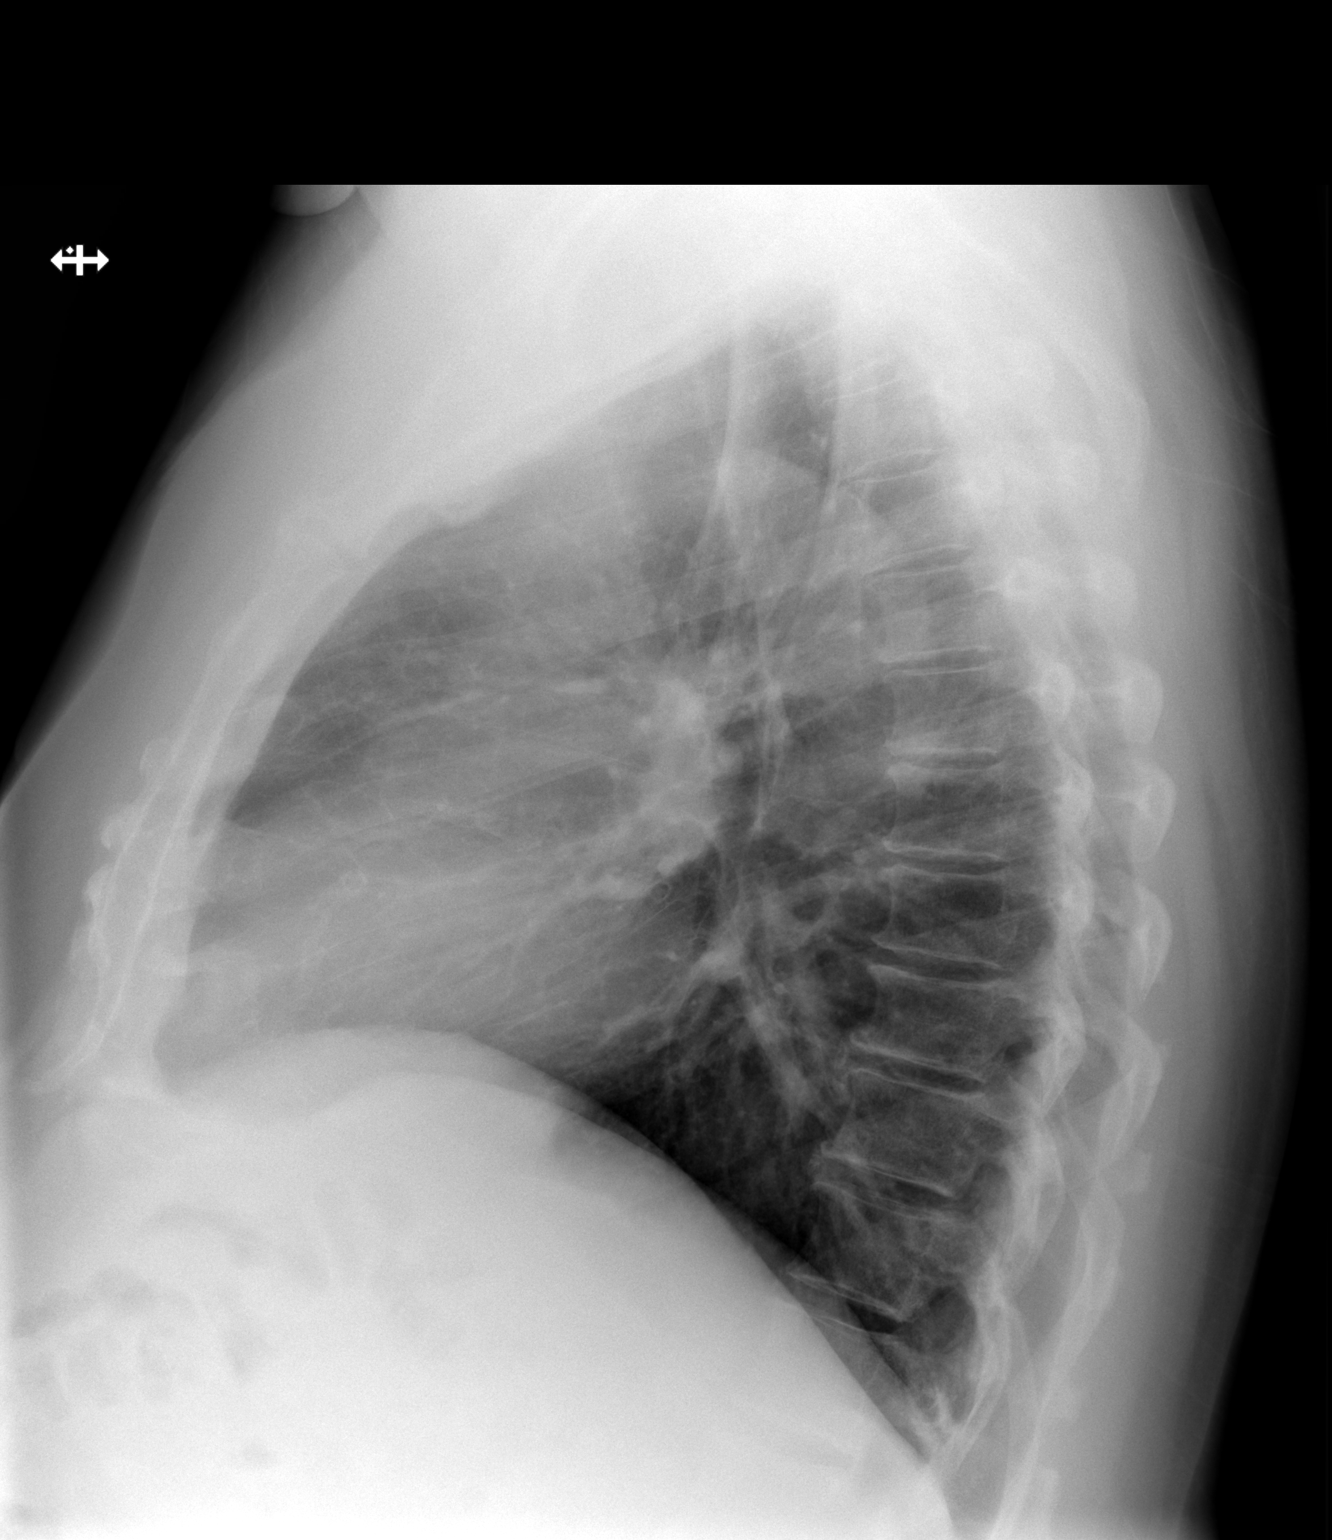

[2 of 2 positions shown; findings below may reference images not displayed]

FINDINGS: The heart size and mediastinal contours are within normal limits.
Both lungs are clear. The visualized skeletal structures are
unremarkable.
IMPRESSION: No active cardiopulmonary disease.

## 2023-05-08 ENCOUNTER — Ambulatory Visit: Admitting: Podiatry

## 2023-05-08 ENCOUNTER — Ambulatory Visit (INDEPENDENT_AMBULATORY_CARE_PROVIDER_SITE_OTHER)

## 2023-05-08 ENCOUNTER — Encounter: Payer: Self-pay | Admitting: Podiatry

## 2023-05-08 DIAGNOSIS — R5383 Other fatigue: Secondary | ICD-10-CM | POA: Insufficient documentation

## 2023-05-08 DIAGNOSIS — M2550 Pain in unspecified joint: Secondary | ICD-10-CM | POA: Insufficient documentation

## 2023-05-08 DIAGNOSIS — K2270 Barrett's esophagus without dysplasia: Secondary | ICD-10-CM | POA: Insufficient documentation

## 2023-05-08 DIAGNOSIS — E119 Type 2 diabetes mellitus without complications: Secondary | ICD-10-CM | POA: Insufficient documentation

## 2023-05-08 DIAGNOSIS — F410 Panic disorder [episodic paroxysmal anxiety] without agoraphobia: Secondary | ICD-10-CM | POA: Insufficient documentation

## 2023-05-08 DIAGNOSIS — J678 Hypersensitivity pneumonitis due to other organic dusts: Secondary | ICD-10-CM | POA: Insufficient documentation

## 2023-05-08 DIAGNOSIS — M722 Plantar fascial fibromatosis: Secondary | ICD-10-CM | POA: Diagnosis not present

## 2023-05-08 DIAGNOSIS — F32A Depression, unspecified: Secondary | ICD-10-CM | POA: Insufficient documentation

## 2023-05-08 DIAGNOSIS — L608 Other nail disorders: Secondary | ICD-10-CM | POA: Insufficient documentation

## 2023-05-08 DIAGNOSIS — E669 Obesity, unspecified: Secondary | ICD-10-CM | POA: Insufficient documentation

## 2023-05-08 DIAGNOSIS — M545 Low back pain, unspecified: Secondary | ICD-10-CM | POA: Insufficient documentation

## 2023-05-08 DIAGNOSIS — M7662 Achilles tendinitis, left leg: Secondary | ICD-10-CM

## 2023-05-08 DIAGNOSIS — M1991 Primary osteoarthritis, unspecified site: Secondary | ICD-10-CM | POA: Insufficient documentation

## 2023-05-08 DIAGNOSIS — I10 Essential (primary) hypertension: Secondary | ICD-10-CM | POA: Insufficient documentation

## 2023-05-08 DIAGNOSIS — M0609 Rheumatoid arthritis without rheumatoid factor, multiple sites: Secondary | ICD-10-CM | POA: Insufficient documentation

## 2023-05-08 DIAGNOSIS — R0600 Dyspnea, unspecified: Secondary | ICD-10-CM | POA: Insufficient documentation

## 2023-05-08 DIAGNOSIS — G4731 Primary central sleep apnea: Secondary | ICD-10-CM | POA: Insufficient documentation

## 2023-05-08 MED ORDER — DEXAMETHASONE SODIUM PHOSPHATE 120 MG/30ML IJ SOLN
2.0000 mg | Freq: Once | INTRAMUSCULAR | Status: AC
  Administered 2023-05-08: 2 mg via INTRA_ARTICULAR

## 2023-05-08 MED ORDER — MELOXICAM 15 MG PO TABS: 15.0000 mg | ORAL_TABLET | Freq: Every day | ORAL | 3 refills | Status: DC

## 2023-05-09 NOTE — Progress Notes (Signed)
 Subjective:  Patient ID: Greg Alvarez, male    DOB: [birth year],  MRN: [id number] HPI Chief Complaint  Patient presents with   Foot Pain    Posterior/plantar heel left - aching x 3 months, no injury, swelling posterior, stent put in and can't use NSAIDS, been icing  Sub 5th MPJ right - on a cruise 05/04/23 and cut foot on bottom of hot tub, wanted it check to make sure it was healing   New Patient (Initial Visit)    Diabetic - 7.9 Feb    63 y.o. male presents with the above complaint.   ROS: Denies fever chills nausea vomit muscle aches pains calf pain back pain chest pain shortness of breath.  Past Medical History:  Diagnosis Date   Coronary artery disease    Diabetes mellitus type 2, noninsulin dependent (HCC)    GERD (gastroesophageal reflux disease)    High cholesterol    Hypertension    under control with med., has been on med. x 10 yr.   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   left shoulder, states unable to raise left arm   Myocardial infarction [facility])    Panic attacks    Rheumatoid arthritis (HCC)    Sleep apnea    Tenosynovitis of wrist 03/2013   right   Wears glasses    Past Surgical History:  Procedure Laterality Date   APPENDECTOMY  02/27/1978   BIOPSY  04/11/2018   Procedure: BIOPSY;  Surgeon: Corbin Ade, MD;  [facility];  Service: Endoscopy;;  esophagus   BIOPSY  07/21/2021   Procedure: BIOPSY;  Surgeon: Corbin Ade, MD;  [facility];  Service: Endoscopy;;   CARDIAC CATHETERIZATION  02/28/2000   1 stent placed in LAD   CARPAL TUNNEL RELEASE Left 07/29/2013   Procedure: LEFT CARPAL TUNNEL RELEASE;  Surgeon: Nicki Reaper, MD;  Location: [facility];  Service: Orthopedics;  Laterality: Left;   COLONOSCOPY N/A 01/12/2014   Procedure: COLONOSCOPY;  Surgeon: Corbin Ade, MD;  [facility];  Service: Endoscopy;  Laterality: N/A;  9:30 AM   COLONOSCOPY WITH PROPOFOL N/A 07/21/2021   Procedure:  COLONOSCOPY WITH PROPOFOL;  Surgeon: Corbin Ade, MD;  [facility];  Service: Endoscopy;  Laterality: N/A;  10:30am   CORONARY LITHOTRIPSY N/A 02/13/2023   Procedure: CORONARY LITHOTRIPSY;  Surgeon: Swaziland, Peter M, MD;  Location: [facility] INVAS[facility] CV LAB;  Service: Cardiovascular;  Laterality: N/A;   CORONARY STENT INTERVENTION N/A 02/13/2023   Procedure: CORONARY STENT INTERVENTION;  Surgeon: Swaziland, Peter M, MD;  Location: [facility] INVASIVE CV LAB;  Service: Cardiovascular;  Laterality: N/A;   ESOPHAGOGASTRODUODENOSCOPY (EGD) WITH PROPOFOL N/A 04/11/2018   Procedure: ESOPHAGOGASTRODUODENOSCOPY (EGD) WITH PROPOFOL;  Surgeon: Corbin Ade, MD;  [facility];  Service: Endoscopy;  Laterality: N/A;  1:15pm   ESOPHAGOGASTRODUODENOSCOPY (EGD) WITH PROPOFOL N/A 07/21/2021   Procedure: ESOPHAGOGASTRODUODENOSCOPY (EGD) WITH PROPOFOL;  Surgeon: Corbin Ade, MD;  [facility];  Service: Endoscopy;  Laterality: N/A;   LEFT HEART CATH AND CORONARY ANGIOGRAPHY N/A 08/09/2021   Procedure: LEFT HEART CATH AND CORONARY ANGIOGRAPHY;  Surgeon: Lyn Records, MD;  Location: [facility];  Service: Cardiovascular;  Laterality: N/A;   LEFT HEART CATH AND CORONARY ANGIOGRAPHY N/A 02/13/2023   Procedure: LEFT HEART CATH AND CORONARY ANGIOGRAPHY;  Surgeon: Swaziland, Peter M, MD;  Location: [facility] INVASIVE CV LAB;  Service: Cardiovascular;  Laterality: N/A;   SHOULDER  ARTHROSCOPY W/ ROTATOR CUFF REPAIR  05/28/2013   left-DSC   TENDON REPAIR Right    hand   TENOSYNOVECTOMY Right 04/08/2013   Procedure: TENOSYNOVECTOMY/FLEXOR TENDONS RIGHT WRIST;  Surgeon: Nicki Reaper, MD;  Location: [facility];  Service: Orthopedics;  Laterality: Right;    Current Outpatient Medications:    meloxicam (MOBIC) 15 MG tablet, Take 1 tablet (15 mg total) by mouth daily., Disp: 30 tablet, Rfl: 3   traMADol (ULTRAM) 50 MG tablet, Take 50 mg by mouth 3 (three) times daily as needed., Disp: ,  Rfl:    amLODipine (NORVASC) 5 MG tablet, Take 5 mg by mouth daily., Disp: , Rfl:    aspirin 81 MG chewable tablet, Chew 81 mg by mouth daily., Disp: , Rfl:    atorvastatin (LIPITOR) 40 MG tablet, Take 40 mg by mouth at bedtime., Disp: , Rfl:    clopidogrel (PLAVIX) 75 MG tablet, Take 1 tablet (75 mg total) by mouth daily with breakfast., Disp: 90 tablet, Rfl: 1   cyclobenzaprine (FLEXERIL) 10 MG tablet, Take 10 mg by mouth 3 (three) times daily as needed for muscle spasms., Disp: , Rfl:    empagliflozin (JARDIANCE) 25 MG TABS tablet, Take 12.5 mg by mouth daily., Disp: , Rfl:    escitalopram (LEXAPRO) 10 MG tablet, Take 10 mg by mouth daily., Disp: , Rfl:    ezetimibe (ZETIA) 10 MG tablet, TAKE 1 TABLET (10 MG TOTAL) BY MOUTH DAILY., Disp: 90 tablet, Rfl: 3   hydrOXYzine (ATARAX) 25 MG tablet, Take 25 mg by mouth 3 (three) times daily as needed for anxiety., Disp: , Rfl:    isosorbide mononitrate (IMDUR) 30 MG 24 hr tablet, Take 30 mg by mouth daily., Disp: , Rfl:    losartan (COZAAR) 100 MG tablet, Take 100 mg by mouth daily., Disp: , Rfl:    metFORMIN (GLUCOPHAGE-XR) 500 MG 24 hr tablet, Take 500 mg by mouth in the morning and at bedtime., Disp: , Rfl:    metoprolol succinate (TOPROL-XL) 25 MG 24 hr tablet, TAKE 1 TABLET EVERY DAY, Disp: 90 tablet, Rfl: 3   Mometasone Furoate (ASMANEX HFA) 200 MCG/ACT AERO, Inhale 2 puffs into the lungs daily., Disp: , Rfl:    Multiple Vitamin (ONE-A-DAY MENS PO), Take 1 tablet by mouth daily. , Disp: , Rfl:    nitroGLYCERIN (NITROSTAT) 0.4 MG SL tablet, Place 1 tablet (0.4 mg total) under the tongue every 5 (five) minutes as needed for chest pain., Disp: 25 tablet, Rfl: 2   Omega-3 Fatty Acids (FISH OIL) 1000 MG CAPS, Take 1,000 mg by mouth 2 (two) times daily., Disp: , Rfl:    pantoprazole (PROTONIX) 40 MG tablet, Take 40 mg by mouth daily., Disp: , Rfl:    Tiotropium Bromide Monohydrate 1.25 MCG/ACT AERS, Take 2 puffs by mouth daily., Disp: , Rfl:     traZODone (DESYREL) 100 MG tablet, Take 100 mg by mouth at bedtime., Disp: , Rfl:   Allergies  Allergen Reactions   Ativan [Lorazepam] Other (See Comments)    HYPERACTIVITY   Benazepril Cough   Review of Systems Objective:  There were no vitals filed for this visit.  General: Well developed, nourished, in no acute distress, alert and oriented x3   Dermatological: Skin is warm, dry and supple bilateral. Nails x 10 are well maintained; remaining integument appears unremarkable at this time. There are no open sores, no preulcerative lesions, no rash or signs of infection present.  Small laceration plantar aspect of the  fifth metatarsal head area right foot appears to be healing does not demonstrate any sign of infection.  Vascular: Dorsalis Pedis artery and Posterior Tibial artery pedal pulses are 2/4 bilateral with immedate capillary fill time. Pedal hair growth present. No varicosities and no lower extremity edema present bilateral.   Neruologic: Grossly intact via light touch bilateral. Vibratory intact via tuning fork bilateral. Protective threshold with Semmes Wienstein monofilament intact to all pedal sites bilateral. Patellar and Achilles deep tendon reflexes 2+ bilateral. No Babinski or clonus noted bilateral.   Musculoskeletal: No gross boney pedal deformities bilateral. No pain, crepitus, or limitation noted with foot and ankle range of motion bilateral. Muscular strength 5/5 in all groups tested bilateral.  Pain on palpation medial calcaneal tubercle of the left heel but minimally so.  The majority of his tenderness is located over the posterior superior aspect of the calcaneus superficial to the Achilles.  He does have some tenderness on palpation of the Achilles medial laterally at the posterior superior insertion site of the calcaneus.  Fluctuance consistent with bursitis  Gait: Unassisted, Nonantalgic.    Radiographs:  Rectus foot deformity of the left demonstrates an osseously  mature individual good bone mineralization plantar and posterior calcaneal heel spurs no thickening of the Achilles tendon though soft tissue swelling in the posterior aspect of the calcaneus is present.  No acute findings are noted.  Assessment & Plan:   Assessment: Left foot demonstrates retrocalcaneal heel spur insertional Achilles tendinitis but fluctuance consistent with bursitis.  Plan: I injected the bursa today 4 mg of dexamethasone local anesthetic placed him in a cam boot and started him on mobic.  Recommended ice therapy and I will follow-up with him in 1 month     Lener Ventresca T. [place], [place]

## 2023-06-05 ENCOUNTER — Ambulatory Visit: Admitting: Podiatry

## 2023-07-03 ENCOUNTER — Ambulatory Visit (INDEPENDENT_AMBULATORY_CARE_PROVIDER_SITE_OTHER): Admitting: Sports Medicine

## 2023-07-03 ENCOUNTER — Ambulatory Visit: Admitting: Orthopaedic Surgery

## 2023-07-03 ENCOUNTER — Ambulatory Visit (INDEPENDENT_AMBULATORY_CARE_PROVIDER_SITE_OTHER): Payer: Self-pay

## 2023-07-03 ENCOUNTER — Other Ambulatory Visit: Payer: Self-pay

## 2023-07-03 ENCOUNTER — Encounter: Payer: Self-pay | Admitting: Sports Medicine

## 2023-07-03 DIAGNOSIS — M25552 Pain in left hip: Secondary | ICD-10-CM | POA: Diagnosis not present

## 2023-07-03 DIAGNOSIS — M160 Bilateral primary osteoarthritis of hip: Secondary | ICD-10-CM

## 2023-07-03 MED ORDER — LIDOCAINE HCL 1 % IJ SOLN
4.0000 mL | INTRAMUSCULAR | Status: AC | PRN
  Administered 2023-07-03: 4 mL

## 2023-07-03 MED ORDER — METHYLPREDNISOLONE ACETATE 40 MG/ML IJ SUSP
40.0000 mg | INTRAMUSCULAR | Status: AC | PRN
  Administered 2023-07-03: 40 mg via INTRA_ARTICULAR

## 2023-07-03 NOTE — Progress Notes (Signed)
   Procedure Note  Patient: Greg Alvarez             Date of Birth: [birth year]           MRN: [id number]             Visit Date: 07/03/2023  Procedures: Visit Diagnoses:  1. Pain in left hip   2. Bilateral primary osteoarthritis of hip    Large Joint Inj: L hip joint on 07/03/2023 9:58 AM Indications: pain Details: 22 G 3.5 in needle, ultrasound-guided anterior approach Medications: 4 mL lidocaine  1 %; 40 mg methylPREDNISolone  acetate 40 MG/ML Outcome: tolerated well, no immediate complications  Procedure: US -guided intra-articular hip injection, Left After discussion on risks/benefits/indications and informed verbal consent was obtained, a timeout was performed. Patient was lying supine on exam table. The hip was cleaned with betadine and alcohol swabs. Then utilizing ultrasound guidance, the patient's femoral head and neck junction was identified and subsequently injected with 4:2 lidocaine :depomedrol via an in-plane approach with ultrasound visualization of the injectate administered into the hip joint. Patient tolerated procedure well without immediate complications.  *Procedure performed with Dr. Panav Jha  Procedure, treatment alternatives, risks and benefits explained, specific risks discussed. Consent was given by the patient. Immediately prior to procedure a time out was called to verify the correct patient, procedure, equipment, support staff and site/side marked as required. Patient was prepped and draped in the usual sterile fashion.     - patient tolerated procedure well, discussed post-injection protocol - follow-up with Dr. Christiane Cowing as indicated; I am happy to see them as needed  Shauna Del, DO Primary Care Sports Medicine Physician  [facility] - Orthopedics  This note was dictated using Dragon naturally speaking software and may contain errors in syntax, spelling, or content which have not been identified prior to signing this note.

## 2023-07-03 NOTE — Progress Notes (Signed)
 Office Visit Note   Patient: Greg Alvarez           Date of Birth: 1960/12/09           MRN: [id number] Visit Date: 07/03/2023              Requested by: Debara Faden, PA [address] [place],  [place] [address] PCP: Debara Faden, PA   Assessment & Plan: Visit Diagnoses:  1. Pain in left hip     Plan: History of Present Illness Greg Alvarez is a 63 year old male who presents with worsening left hip pain over the past two years.  The left hip pain is primarily located on the lateral aspect, extending slightly posteriorly, without groin radiation. Rotating the leg reproduces the pain. He experiences a shooting pain down the leg while walking. The pain worsens with physical activities, particularly at the gym, such as using the treadmill. Tenderness is noted in the hip area during specific movements, especially upon flexion and rotation.  Physical Exam MUSCULOSKELETAL: Pain on internal and external rotation of the left hip, mild pain on flexion, and tenderness on resisted abduction. No tenderness on palpation of the lumbar spine.  Results RADIOLOGY Hip X-ray: Large prominent osteophyte in acetabulum, evidence of osteoarthritis Lumbar spine X-ray: Degenerative changes in lower lumbar segments  Assessment and Plan Osteoarthritis of hip Chronic hip pain for two years, exacerbated by gym activities. Pain localized to lateral and posterior hip with occasional radicular symptoms. Examination suggests hip involvement. Large osteophyte present. Hip suspected as pain source; cortisone injection proposed for diagnostic clarity. - Schedule cortisone injection in hip joint. - Advise cessation of pain-inducing activities, such as treadmill use, until post-injection. - Reassess pain improvement three weeks post-injection.  Osteoarthritis of lumbar spine Arthritis in lower lumbar spine segments. Hip suspected as primary pain source. Prednisone and physical therapy considered if hip  injection fails to relieve pain. - Consider physical therapy for lumbar spine if hip injection fails. - Consider prednisone dose pack for inflammation reduction if needed.  Follow-Up Instructions: No follow-ups on file.   Orders:  Orders Placed This Encounter  Procedures   XR Lumbar Spine 2-3 Views   XR Pelvis 1-2 Views   No orders of the defined types were placed in this encounter.     Procedures: No procedures performed   Clinical Data: No additional findings.   Subjective: Chief Complaint  Patient presents with   Left Hip - Pain    HPI  Review of Systems  Constitutional: Negative.   HENT: Negative.    Eyes: Negative.   Respiratory: Negative.    Cardiovascular: Negative.   Gastrointestinal: Negative.   Endocrine: Negative.   Genitourinary: Negative.   Skin: Negative.   Allergic/Immunologic: Negative.   Neurological: Negative.   Hematological: Negative.   Psychiatric/Behavioral: Negative.    All other systems reviewed and are negative.    Objective: Vital Signs: There were no vitals taken for this visit.  Physical Exam Vitals and nursing note reviewed.  Constitutional:      Appearance: He is well-developed.  HENT:     Head: Normocephalic and atraumatic.  Eyes:     Pupils: Pupils are equal, round, and reactive to light.  Pulmonary:     Effort: Pulmonary effort is normal.  Abdominal:     Palpations: Abdomen is soft.  Musculoskeletal:        General: Normal range of motion.     Cervical back: Neck supple.  Skin:  General: Skin is warm.  Neurological:     Mental Status: He is alert and oriented to person, place, and time.  Psychiatric:        Behavior: Behavior normal.        Thought Content: Thought content normal.        Judgment: Judgment normal.     Ortho Exam  Specialty Comments:  No specialty comments available.  Imaging: XR Pelvis 1-2 Views Result Date: 07/03/2023 X-rays of the pelvis show degenerative changes of the bilateral  hips with prominent superior acetabular osteophytic spurring.  Significant joint space narrowing is present.  XR Lumbar Spine 2-3 Views Result Date: 07/03/2023 X-rays of the lumbar spine show diffuse facet disease without any acute abnormalities.  Well-preserved disc spaces.    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) 05/08/2023   Joint pain 05/08/2023   Low back pain 05/08/2023   Nail pitting 05/08/2023   Obesity 05/08/2023   Dyspnea, unspecified 05/08/2023   Primary central sleep apnea 05/08/2023   Primary localized osteoarthrosis of multiple sites 05/08/2023   Primary osteoarthritis 05/08/2023   Rheumatoid arthritis without rheumatoid factor, multiple sites (HCC) 05/08/2023   Hypertension 02/13/2023   Hyperlipidemia 02/13/2023   Type 2 diabetes mellitus with obesity (HCC) 02/13/2023   Obstructive sleep apnea 02/13/2023   CAD (coronary artery disease) 08/09/2021   Angina pectoris (HCC) 08/09/2021   GERD (gastroesophageal reflux disease) 03/18/2018   Nausea without vomiting 03/18/2018   Unstable angina (HCC) 09/15/2016   Screening for colorectal cancer    Past Medical History:  Diagnosis Date   Coronary artery disease    Diabetes mellitus type 2, noninsulin dependent (HCC)    GERD (gastroesophageal reflux disease)    High cholesterol    Hypertension    under control with med., has been on med. x 10 yr.   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   left shoulder, states unable to raise left arm   Myocardial infarction [facility])    Panic attacks    Rheumatoid arthritis (HCC)    Sleep apnea    Tenosynovitis of wrist 03/2013   right   Wears glasses     Family History   Problem Relation Age of Onset   Anesthesia problems Sister        hard to wake up post-op   Parkinson's disease Sister    Multiple sclerosis Sister    Epilepsy Mother    Heart disease Father    Parkinson's disease Father    Heart failure Father    Heart attack Father    Heart disease Brother    Heart attack Brother    Stroke Brother    Colon cancer Neg Hx    Gastric cancer Neg Hx    Esophageal cancer Neg Hx     Past Surgical History:  Procedure Laterality Date   APPENDECTOMY  02/27/1978   BIOPSY  04/11/2018   Procedure: BIOPSY;  Surgeon: Suzette Espy, MD;  [facility];  Service: Endoscopy;;  esophagus   BIOPSY  07/21/2021   Procedure: BIOPSY;  Surgeon: Suzette Espy, MD;  [facility];  Service: Endoscopy;;   CARDIAC CATHETERIZATION  02/28/2000   1 stent placed in LAD  CARPAL TUNNEL RELEASE Left 07/29/2013   Procedure: LEFT CARPAL TUNNEL RELEASE;  Surgeon: Kemp Patter, MD;  Location: [facility];  Service: Orthopedics;  Laterality: Left;   COLONOSCOPY N/A 01/12/2014   Procedure: COLONOSCOPY;  Surgeon: Suzette Espy, MD;  [facility];  Service: Endoscopy;  Laterality: N/A;  9:30 AM   COLONOSCOPY WITH PROPOFOL  N/A 07/21/2021   Procedure: COLONOSCOPY WITH PROPOFOL ;  Surgeon: Suzette Espy, MD;  [facility];  Service: Endoscopy;  Laterality: N/A;  10:30am   CORONARY LITHOTRIPSY N/A 02/13/2023   Procedure: CORONARY LITHOTRIPSY;  Surgeon: Swaziland, Peter M, MD;  Location: [facility] INVASIVE CV LAB;  Service: Cardiovascular;  Laterality: N/A;   CORONARY STENT INTERVENTION N/A 02/13/2023   Procedure: CORONARY STENT INTERVENTION;  Surgeon: Swaziland, Peter M, MD;  Location: [facility]VAS[facility] CV LAB;  Service: Cardiovascular;  Laterality: N/A;   ESOPHAGOGASTRODUODENOSCOPY (EGD) WITH PROPOFOL  N/A 04/11/2018   Procedure: ESOPHAGOGASTRODUODENOSCOPY (EGD) WITH PROPOFOL ;  Surgeon: Suzette Espy, MD;  [facility];  Service:  Endoscopy;  Laterality: N/A;  1:15pm   ESOPHAGOGASTRODUODENOSCOPY (EGD) WITH PROPOFOL  N/A 07/21/2021   Procedure: ESOPHAGOGASTRODUODENOSCOPY (EGD) WITH PROPOFOL ;  Surgeon: Suzette Espy, MD;  [facility];  Service: Endoscopy;  Laterality: N/A;   LEFT HEART CATH AND CORONARY ANGIOGRAPHY N/A 08/09/2021   Procedure: LEFT HEART CATH AND CORONARY ANGIOGRAPHY;  Surgeon: Arty Binning, MD;  Location: [facility];  Service: Cardiovascular;  Laterality: N/A;   LEFT HEART CATH AND CORONARY ANGIOGRAPHY N/A 02/13/2023   Procedure: LEFT HEART CATH AND CORONARY ANGIOGRAPHY;  Surgeon: Swaziland, Peter M, MD;  Location: [facility] INVASIVE CV LAB;  Service: Cardiovascular;  Laterality: N/A;   SHOULDER ARTHROSCOPY W/ ROTATOR CUFF REPAIR  05/28/2013   left-DSC   TENDON REPAIR Right    hand   TENOSYNOVECTOMY Right 04/08/2013   Procedure: TENOSYNOVECTOMY/FLEXOR TENDONS RIGHT WRIST;  Surgeon: Kemp Patter, MD;  Location: [facility];  Service: Orthopedics;  Laterality: Right;   Social History   Occupational History   Not on file  Tobacco Use   Smoking status: Former    Current packs/day: 0.00    Types: Cigarettes    Quit date: 03/13/1998    Years since quitting: 25.3   Smokeless tobacco: Never  Vaping Use   Vaping status: Never Used  Substance and Sexual Activity   Alcohol use: Not Currently    Comment: once or twice a year   Drug use: No   Sexual activity: Yes    Partners: Female

## 2023-07-05 ENCOUNTER — Ambulatory Visit: Admitting: Podiatry

## 2023-07-05 ENCOUNTER — Encounter: Payer: Self-pay | Admitting: Podiatry

## 2023-07-05 DIAGNOSIS — M7662 Achilles tendinitis, left leg: Secondary | ICD-10-CM

## 2023-07-05 MED ORDER — DEXAMETHASONE SODIUM PHOSPHATE 120 MG/30ML IJ SOLN
2.0000 mg | Freq: Once | INTRAMUSCULAR | Status: AC
  Administered 2023-07-05: 2 mg via INTRA_ARTICULAR

## 2023-07-09 NOTE — Progress Notes (Signed)
 He presents today states that the posterior plantar aspect of his left heel doing a little better he just wanted to know if he could get another shot he wore the boot for 3 weeks but really did not seem to help in that much.  Objective: Vital signs are stable alert and oriented x 3.  Much decrease in symptomatology from previous evaluation is not nearly as warm the fluctuance has decreased considerably on the posterior aspect of the Achilles area.  Margins are much more easily palpable.  Assessment: Resolving Achilles tendinitis bursitis.  Plan: I injected the area today of the left Achilles in the area of maximal tenderness where there is a small area of fluctuance.  Most consistent with bursitis.  This was injected with 4 mg of dexamethasone  and local anesthetic we will follow-up with him in the near future for reevaluation.

## 2023-07-10 ENCOUNTER — Ambulatory Visit: Admitting: Orthopedic Surgery

## 2023-07-12 ENCOUNTER — Other Ambulatory Visit: Payer: Self-pay | Admitting: Cardiology

## 2023-07-18 ENCOUNTER — Other Ambulatory Visit: Payer: Self-pay | Admitting: Cardiology

## 2023-07-18 DIAGNOSIS — E7800 Pure hypercholesterolemia, unspecified: Secondary | ICD-10-CM

## 2023-08-30 ENCOUNTER — Ambulatory Visit: Admitting: Podiatry

## 2023-09-04 NOTE — Progress Notes (Signed)
 Cardiology Office Note    Date:  09/07/2023   ID:  Greg Alvarez, DOB [birth year], MRN [id number]  PCP:  Dasie Perkins, PA  Cardiologist:  Keelyn Monjaras [place], MD   Chief Complaint  Patient presents with   Coronary Artery Disease       History of Present Illness: Greg Alvarez is a 63 y.o. male who is seen for follow up CAD. He has a history of HTN, HLD, OSA and DM. He presented in 7/18 to [facility] for evaluation of an episode of dizziness, shortness of breath, chest tightness, and aphasia. His troponin was negative and a stress echocardiogram was ordered. His stress test was positive for inducible ischemia. EF was normal at rest. Patient had a left heart cath on 09/14/2016. Cath was significant for 75% prox ramus, 70% RPDA, 60% RPL and 75% mCx (small). Culprit lesion was a 90% prox/mid LAD lesion beginning at 1st septal and spans into mid segment. A 2.75 x 28 mm Promus stent was placed in the LAD and post dilated to 3.25 mm.  I personally reviewed cardiac cath films from [facility] on 09/14/16 and concur with the findings noted above.  He had a Myoview  03/15/18 and this showed normal perfusion and normal EF.   He is followed at the [facility] and by [facility].   In June 2023 he noted symptoms of DOE, dizziness and chest tightness. Ecg was abnormal and referred here. He subsequently underwent cardiac cath showing patent LAD stent. Moderate disease in LCx distribution and distal PL branch of RCA. Medical management recommended.   In Dec he presented with 2-3 month history of worsening chest pain and dyspnea. He had a nuclear stress test at the [place] showing inferior and inferolateral ischemia. Repeat cardiac cath showed high grade stenosis in the proximal RCA treated with DES. Also the PL branch was occluded and there was a significant stenosis in the PDA. Unable to cross the PL occlusion with a wire. Attempted to treat the PDA but even with high pressure POBA and Shockwave lithotripsy we were unable to  expand the lesion. The LAD stent was patent. Moderate disease in small caliber LCx was unchanged.   On follow up today he is doing very well. No chest pain or dyspnea since Dec. Going to gym regularly and can do recumbent machine for and hour and treadmill for 30 minutes. He has lost 5 lbs on Trulicity. Reports labs done at [facility] looked good. Last A1c 7.1%. Not using CPAP due to anxiety.    Past Medical History:  Diagnosis Date   Coronary artery disease    Diabetes mellitus type 2, noninsulin dependent (HCC)    GERD (gastroesophageal reflux disease)    High cholesterol    Hypertension    under control with med., has been on med. x 10 yr.   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   left shoulder, states unable to raise left arm   Myocardial infarction [facility])    Panic attacks    Rheumatoid arthritis (HCC)    Sleep apnea    Tenosynovitis of wrist 03/2013   right   Wears glasses     Past Surgical History:  Procedure Laterality Date   APPENDECTOMY  02/27/1978   BIOPSY  04/11/2018   Procedure: BIOPSY;  Surgeon: Shaaron Lamar HERO, MD;  [facility];  Service: Endoscopy;;  esophagus   BIOPSY  07/21/2021   Procedure: BIOPSY;  Surgeon: Shaaron Lamar HERO, MD;  [facility];  Service: Endoscopy;;   CARDIAC CATHETERIZATION  02/28/2000   1 stent placed in LAD   CARPAL TUNNEL RELEASE Left 07/29/2013   Procedure: LEFT CARPAL TUNNEL RELEASE;  Surgeon: Arley JONELLE Curia, MD;  Location: [facility];  Service: Orthopedics;  Laterality: Left;   COLONOSCOPY N/A 01/12/2014   Procedure: COLONOSCOPY;  Surgeon: Lamar CHRISTELLA Hollingshead, MD;  [facility];  Service: Endoscopy;  Laterality: N/A;  9:30 AM   COLONOSCOPY WITH PROPOFOL  N/A 07/21/2021   Procedure: COLONOSCOPY WITH PROPOFOL ;  Surgeon: Hollingshead Lamar CHRISTELLA, MD;  [facility];  Service: Endoscopy;  Laterality: N/A;  10:30am   CORONARY LITHOTRIPSY N/A 02/13/2023   Procedure: CORONARY LITHOTRIPSY;  Surgeon: [place],  Kendal Raffo M, MD;  Location: [facility] INVASIVE CV LAB;  Service: Cardiovascular;  Laterality: N/A;   CORONARY STENT INTERVENTION N/A 02/13/2023   Procedure: CORONARY STENT INTERVENTION;  Surgeon: [place], Jamason Peckham M, MD;  Location: [facility] INVASIVE CV LAB;  Service: Cardiovascular;  Laterality: N/A;   ESOPHAGOGASTRODUODENOSCOPY (EGD) WITH PROPOFOL  N/A 04/11/2018   Procedure: ESOPHAGOGASTRODUODENOSCOPY (EGD) WITH PROPOFOL ;  Surgeon: Hollingshead Lamar CHRISTELLA, MD;  [facility];  Service: Endoscopy;  Laterality: N/A;  1:15pm   ESOPHAGOGASTRODUODENOSCOPY (EGD) WITH PROPOFOL  N/A 07/21/2021   Procedure: ESOPHAGOGASTRODUODENOSCOPY (EGD) WITH PROPOFOL ;  Surgeon: Hollingshead Lamar CHRISTELLA, MD;  [facility];  Service: Endoscopy;  Laterality: N/A;   LEFT HEART CATH AND CORONARY ANGIOGRAPHY N/A 08/09/2021   Procedure: LEFT HEART CATH AND CORONARY ANGIOGRAPHY;  Surgeon: Claudene Victory ORN, MD;  Location: [facility]VASIVE CV LAB;  Service: Cardiovascular;  Laterality: N/A;   LEFT HEART CATH AND CORONARY ANGIOGRAPHY N/A 02/13/2023   Procedure: LEFT HEART CATH AND CORONARY ANGIOGRAPHY;  Surgeon: [place], Shandon Matson M, MD;  Location: [facility] INVASIVE CV LAB;  Service: Cardiovascular;  Laterality: N/A;   SHOULDER ARTHROSCOPY W/ ROTATOR CUFF REPAIR  05/28/2013   left-DSC   TENDON REPAIR Right    hand   TENOSYNOVECTOMY Right 04/08/2013   Procedure: TENOSYNOVECTOMY/FLEXOR TENDONS RIGHT WRIST;  Surgeon: Arley JONELLE Curia, MD;  Location: [facility];  Service: Orthopedics;  Laterality: Right;     Current Outpatient Medications  Medication Sig Dispense Refill   amLODipine (NORVASC) 5 MG tablet Take 5 mg by mouth daily.     aspirin  81 MG chewable tablet Chew 81 mg by mouth daily.     atorvastatin (LIPITOR) 40 MG tablet Take 40 mg by mouth at bedtime.     clopidogrel  (PLAVIX ) 75 MG tablet TAKE 1 TABLET EVERY DAY WITH BREAKFAST 90 tablet 3   cyclobenzaprine (FLEXERIL) 10 MG tablet Take 10 mg by mouth 3 (three) times daily as needed for muscle  spasms.     empagliflozin (JARDIANCE) 25 MG TABS tablet Take 12.5 mg by mouth daily.     escitalopram (LEXAPRO) 10 MG tablet Take 10 mg by mouth daily.     ezetimibe  (ZETIA ) 10 MG tablet TAKE 1 TABLET EVERY DAY 90 tablet 1   hydrOXYzine (ATARAX) 25 MG tablet Take 25 mg by mouth 3 (three) times daily as needed for anxiety.     isosorbide mononitrate (IMDUR) 30 MG 24 hr tablet Take 30 mg by mouth daily.     losartan (COZAAR) 100 MG tablet Take 100 mg by mouth daily.     meloxicam  (MOBIC ) 15 MG tablet Take 1 tablet (15 mg total) by mouth daily. 30 tablet 3   metFORMIN (GLUCOPHAGE-XR) 500 MG 24 hr tablet Take 500 mg by mouth in the morning and at bedtime.  metoprolol  succinate (TOPROL -XL) 25 MG 24 hr tablet TAKE 1 TABLET EVERY DAY 90 tablet 3   Mometasone Furoate (ASMANEX HFA) 200 MCG/ACT AERO Inhale 2 puffs into the lungs daily.     Multiple Vitamin (ONE-A-DAY MENS PO) Take 1 tablet by mouth daily.      nitroGLYCERIN  (NITROSTAT ) 0.4 MG SL tablet Place 1 tablet (0.4 mg total) under the tongue every 5 (five) minutes as needed for chest pain. 25 tablet 2   Omega-3 Fatty Acids (FISH OIL) 1000 MG CAPS Take 1,000 mg by mouth 2 (two) times daily.     pantoprazole  (PROTONIX ) 40 MG tablet Take 40 mg by mouth daily.     Tiotropium Bromide Monohydrate 1.25 MCG/ACT AERS Take 2 puffs by mouth daily.     traMADol (ULTRAM) 50 MG tablet Take 50 mg by mouth 3 (three) times daily as needed.     traZODone (DESYREL) 100 MG tablet Take 100 mg by mouth at bedtime.     No current facility-administered medications for this visit.    Allergies:   Ativan [lorazepam] and Benazepril    Social History:  The patient  reports that he quit smoking about 25 years ago. His smoking use included cigarettes. He has never used smokeless tobacco. He reports that he does not currently use alcohol. He reports that he does not use drugs.   Family History:  The patient's family history includes Anesthesia problems in his sister;  Epilepsy in his mother; Heart attack in his brother and father; Heart disease in his brother and father; Heart failure in his father; Multiple sclerosis in his sister; Parkinson's disease in his father and sister; Stroke in his brother.    ROS:  Please see the history of present illness.   Otherwise, review of systems are positive for .   All other systems are reviewed and negative.    PHYSICAL EXAM: VS:  BP 116/82   Pulse 84   Ht 5' 6 (1.676 m)   Wt 228 lb 9.6 oz (103.7 kg)   SpO2 98%   BMI 36.90 kg/m  , BMI Body mass index is 36.9 kg/m. GENERAL:  Well appearing WM in NAD HEENT:  PERRL, EOMI, sclera are clear. Oropharynx is clear. NECK:  No jugular venous distention, carotid upstroke brisk and symmetric, no bruits, no thyromegaly or adenopathy LUNGS:  Clear to auscultation bilaterally CHEST:  Unremarkable HEART:  RRR,  PMI not displaced or sustained,S1 and S2 within normal limits, no S3, no S4: no clicks, no rubs, no murmurs ABD:  Soft, nontender. BS +, no masses or bruits. No hepatomegaly, no splenomegaly EXT:  2 + pulses throughout, no edema, no cyanosis no clubbing SKIN:  Warm and dry.  No rashes NEURO:  Alert and oriented x 3. Cranial nerves II through XII intact. PSYCH:  Cognitively intact        Recent Labs: 02/13/2023: BUN 15; Creatinine, Ser 0.92; Hemoglobin 15.0; Platelets 255; Potassium 3.8; Sodium 136    Lipid Panel    Component Value Date/Time   CHOL 130 11/14/2021 1007   TRIG 190 (H) 11/14/2021 1007   HDL 36 (L) 11/14/2021 1007   CHOLHDL 3.6 11/14/2021 1007   CHOLHDL 8.3 03/11/2008 0540   VLDL 50 (H) 03/11/2008 0540   LDLCALC 62 11/14/2021 1007   Labs dated 12/17/13: WBC 10.6. platelets 416K. Hgb 14.3. Cholesterol 295, triglycerides 281, HDL 35, LDL 204. Aic 6.4%. creatinine normal.     Labs dated 09/15/16: glucose 160. Other chemistries normal. Cholesterol 150, triglycerides  314, HDL 27, LDL 86. Hgb 13.5.   Labs from [place] in July: cholesterol 102,  triglycerides 88, HDL 32, LDL 52.   Wt Readings from Last 3 Encounters:  09/07/23 228 lb 9.6 oz (103.7 kg)  03/13/23 231 lb 9.6 oz (105.1 kg)  02/13/23 238 lb (108 kg)      Other studies Reviewed: Additional studies/ records that were reviewed today include:   Myoview  03/15/18: Study Highlights   Nuclear stress EF: 56%. The left ventricular ejection fraction is normal (55-65%). There was no ST segment deviation noted during stress. This is a low risk study. No evidence of ischemia or previous infarction The study is normal.    Cardiac cath 08/09/21:  LEFT HEART CATH AND CORONARY ANGIOGRAPHY   Conclusion  CONCLUSIONS: Widely patent left main Patent LAD stent.  Beyond the stent diffuse calcified nonobstructive disease. Circumflex gives an early obtuse marginal LAD contains proximal segmental 70% narrowing.  The third marginal contains 70 to 80% stenosis in the mid vessel. The right coronary is large, heavily calcified, tortuous, and has a 50% globular nodule in the proximal to mid segment, high-grade obstruction in the proximal to mid PDA, eccentric 80% [place] 101 bifurcation stenosis with the first left ventricular branch, and 70% stenosis for the distal and the larger most distal left ventricular branch. Normal LV function and with elevated LVEDP.  Finding is consistent with diastolic heart failure. RECOMMENDATION:  Discussed with Dr. [place] Start anti-ischemic therapy Prescribed nitroglycerin  Further management will depend upon symptoms.      Coronary Diagrams  Diagnostic Dominance: Right  Intervention  Cardiac cath 02/13/23:  LEFT HEART CATH AND CORONARY ANGIOGRAPHY  CORONARY STENT INTERVENTION  CORONARY LITHOTRIPSY   Conclusion      Mid LAD lesion is 25% stenosed.   1st Mrg lesion is 70% stenosed.   Prox RCA lesion is 95% stenosed.   RPAV lesion is 100% stenosed.   RPDA lesion is 80% stenosed.   Prox Cx lesion is 80% stenosed.   A drug-eluting stent was  successfully placed using a SYNERGY XD 4.0X20.   Balloon angioplasty was performed using a BALLN Andrews EMERGE MR 2.5X12.   Post intervention, there is a 0% residual stenosis.   Post intervention, there is a 70% residual stenosis.   Post intervention, there is a 0% residual stenosis.   The left ventricular systolic function is normal.   LV end diastolic pressure is normal.   The left ventricular ejection fraction is 55-65% by visual estimate.   Recommend uninterrupted dual antiplatelet therapy with Aspirin  81mg  daily and Clopidogrel  75mg  daily for a minimum of 12 months (ACS-Class I recommendation).   2 vessel obstructive CAD. Continued patency of stent in the LAD. Disease in LCx is unchanged from prior angiogram and these branches are small in caliber. New high grade stenosis in the proximal to mid RCA. New occlusion of PL branch with left to right collaterals. Moderate obstructive disease in the PDA Good LV function with inferobasal HK. EF 55-60% Normal LVEDP Successful PCI and stenting of the proximal to mid RCA with DES Unsuccessful PCI of the PDA. Lesion treated with balloon angioplasty and Shockwave lithotripsy with rigid unexpanding lesion Unsuccessful PCI of the PL branch of the RCA. This vessel is occluded and unable to cross with a wire. It is collateralized.    Plan: DAPT for one year. Optimize medical therapy. I think he will note significant improvement in his symptoms. If he has refractory limiting angina could consider repeat PCI of  the PDA with atherectomy   ASSESSMENT AND PLAN:  1.  CAD s/p NSTEMI in July 2018. Had DES of LAD.  More recently had accelerated angina and abnormal Myoview  at the [place]. Underwent cardiac cath in Dec showing patent LAD stent. Moderate LCx disease stable. Severe proximal RCA disease. Mod- severe PDA disease and occluded PL branch. Had successful PCI of the proximal RCA with DES. Unable to expand PDA despite Shockwave lithotripsy. Unable to cross the PL branch  with wire. Clinically he is much better. Continue medical therapy for residual disease.  Continue ASA, Plavix  for one year. Continue amlodipine, Toprol , statin.   2. DM type 2 not on insulin. Per primary care. Last A1c 7.1%  3. Hypercholesterolemia. On lipitor 40 mg daily and Zetia  10 mg daily. Goal LDL < 55. Last LDL 52. Labs followed at [facility]  4. HTN controlled.   5. Obesity with OSA. No longer on CPAP  6. RA no longer on methotrexate    Disposition: follow up in 6 months.  Signed, Levina Boyack [place], MD  09/07/2023 3:19 PM    [facility] [address] [place], [place], [address] Phone [phone], Fax [phone]

## 2023-09-07 ENCOUNTER — Encounter: Payer: Self-pay | Admitting: Cardiology

## 2023-09-07 ENCOUNTER — Ambulatory Visit: Attending: Cardiology | Admitting: Cardiology

## 2023-09-07 VITALS — BP 116/82 | HR 84 | Ht 66.0 in | Wt 228.6 lb

## 2023-09-07 DIAGNOSIS — I25118 Atherosclerotic heart disease of native coronary artery with other forms of angina pectoris: Secondary | ICD-10-CM | POA: Diagnosis not present

## 2023-09-07 DIAGNOSIS — I10 Essential (primary) hypertension: Secondary | ICD-10-CM

## 2023-09-07 DIAGNOSIS — E119 Type 2 diabetes mellitus without complications: Secondary | ICD-10-CM

## 2023-09-07 DIAGNOSIS — E7800 Pure hypercholesterolemia, unspecified: Secondary | ICD-10-CM

## 2023-09-07 NOTE — Patient Instructions (Signed)

## 2023-09-11 ENCOUNTER — Ambulatory Visit: Admitting: Podiatry

## 2023-09-25 ENCOUNTER — Ambulatory Visit: Admitting: Podiatry

## 2023-09-27 ENCOUNTER — Ambulatory Visit: Admitting: Podiatry

## 2023-09-27 ENCOUNTER — Encounter: Payer: Self-pay | Admitting: Podiatry

## 2023-09-27 DIAGNOSIS — S86012D Strain of left Achilles tendon, subsequent encounter: Secondary | ICD-10-CM

## 2023-09-27 NOTE — Progress Notes (Signed)
 He presents today states that this just really has not gotten any better if anything is gotten worse it is affecting my ability to perform her daily activities and go to the gym and go to work without this bothering me.  States that it is very painful very troublesome.  Nothing has alleviated the symptoms since March.  Objective: Vital signs are stable alert oriented x 3 he has severe pain on palpation of his left posterior heel and a incontinuity of the Achilles at the insertion site on the calcaneus.  This appears to be a tear or possible partial rupture of the Achilles from the calcaneus.  There is fluctuance in the area possibly bursitis or just fluid blood.  Assessment: Cannot rule out a tear or rupture of the Achilles tendon on the left heel.  Plan: Recommended MRI for evaluation for differential diagnosis and surgical planning.

## 2023-10-08 ENCOUNTER — Encounter: Payer: Self-pay | Admitting: Podiatry

## 2023-10-13 ENCOUNTER — Ambulatory Visit
Admission: RE | Admit: 2023-10-13 | Discharge: 2023-10-13 | Disposition: A | Discharge status: Home / Self Care | Source: Ambulatory Visit | Attending: Podiatry | Admitting: Podiatry

## 2023-10-13 DIAGNOSIS — S86012D Strain of left Achilles tendon, subsequent encounter: Secondary | ICD-10-CM

## 2023-10-23 ENCOUNTER — Encounter: Payer: Self-pay | Admitting: Podiatry

## 2023-11-01 ENCOUNTER — Ambulatory Visit: Payer: Self-pay | Admitting: Podiatry

## 2023-11-02 NOTE — Progress Notes (Signed)
 Spoke to patient will schedule appointment for follow up on MRI results.

## 2023-11-20 ENCOUNTER — Ambulatory Visit: Admitting: Podiatry

## 2023-12-04 ENCOUNTER — Other Ambulatory Visit: Payer: Self-pay

## 2023-12-04 ENCOUNTER — Emergency Department (HOSPITAL_COMMUNITY)

## 2023-12-04 ENCOUNTER — Encounter (HOSPITAL_COMMUNITY): Payer: Self-pay

## 2023-12-04 ENCOUNTER — Emergency Department (HOSPITAL_COMMUNITY)
Admission: EM | Admit: 2023-12-04 | Discharge: 2023-12-04 | Disposition: A | Discharge status: Home / Self Care | Attending: Emergency Medicine | Admitting: Emergency Medicine

## 2023-12-04 DIAGNOSIS — Z7901 Long term (current) use of anticoagulants: Secondary | ICD-10-CM | POA: Insufficient documentation

## 2023-12-04 DIAGNOSIS — I10 Essential (primary) hypertension: Secondary | ICD-10-CM | POA: Diagnosis not present

## 2023-12-04 DIAGNOSIS — Z79899 Other long term (current) drug therapy: Secondary | ICD-10-CM | POA: Diagnosis not present

## 2023-12-04 DIAGNOSIS — S81811A Laceration without foreign body, right lower leg, initial encounter: Secondary | ICD-10-CM | POA: Insufficient documentation

## 2023-12-04 DIAGNOSIS — M25512 Pain in left shoulder: Secondary | ICD-10-CM | POA: Insufficient documentation

## 2023-12-04 DIAGNOSIS — R519 Headache, unspecified: Secondary | ICD-10-CM | POA: Diagnosis not present

## 2023-12-04 DIAGNOSIS — Z7982 Long term (current) use of aspirin: Secondary | ICD-10-CM | POA: Diagnosis not present

## 2023-12-04 DIAGNOSIS — I2510 Atherosclerotic heart disease of native coronary artery without angina pectoris: Secondary | ICD-10-CM | POA: Insufficient documentation

## 2023-12-04 DIAGNOSIS — E119 Type 2 diabetes mellitus without complications: Secondary | ICD-10-CM | POA: Diagnosis not present

## 2023-12-04 DIAGNOSIS — S8991XA Unspecified injury of right lower leg, initial encounter: Secondary | ICD-10-CM | POA: Diagnosis present

## 2023-12-04 DIAGNOSIS — W1839XA Other fall on same level, initial encounter: Secondary | ICD-10-CM | POA: Insufficient documentation

## 2023-12-04 DIAGNOSIS — Z7984 Long term (current) use of oral hypoglycemic drugs: Secondary | ICD-10-CM | POA: Insufficient documentation

## 2023-12-04 DIAGNOSIS — S80811A Abrasion, right lower leg, initial encounter: Secondary | ICD-10-CM

## 2023-12-04 DIAGNOSIS — W19XXXA Unspecified fall, initial encounter: Secondary | ICD-10-CM

## 2023-12-04 MED ORDER — OXYCODONE-ACETAMINOPHEN 5-325 MG PO TABS
1.0000 | ORAL_TABLET | Freq: Once | ORAL | Status: AC
  Administered 2023-12-04: 1 via ORAL
  Filled 2023-12-04: qty 1

## 2023-12-04 MED ORDER — LIDOCAINE-EPINEPHRINE (PF) 2 %-1:200000 IJ SOLN
10.0000 mL | Freq: Once | INTRAMUSCULAR | Status: AC
  Administered 2023-12-04: 10 mL via INTRADERMAL
  Filled 2023-12-04: qty 20

## 2023-12-04 MED ORDER — DIPHENHYDRAMINE HCL 25 MG PO CAPS
25.0000 mg | ORAL_CAPSULE | Freq: Once | ORAL | Status: AC
  Administered 2023-12-04: 25 mg via ORAL
  Filled 2023-12-04: qty 1

## 2023-12-04 MED ORDER — METOCLOPRAMIDE HCL 10 MG PO TABS
10.0000 mg | ORAL_TABLET | Freq: Once | ORAL | Status: AC
  Administered 2023-12-04: 10 mg via ORAL
  Filled 2023-12-04: qty 1

## 2023-12-04 MED ORDER — TETANUS-DIPHTH-ACELL PERTUSSIS 5-2-15.5 LF-MCG/0.5 IM SUSP
0.5000 mL | Freq: Once | INTRAMUSCULAR | Status: AC
  Administered 2023-12-04: 0.5 mL via INTRAMUSCULAR
  Filled 2023-12-04: qty 0.5

## 2023-12-04 MED ORDER — KETOROLAC TROMETHAMINE 60 MG/2ML IM SOLN
30.0000 mg | Freq: Once | INTRAMUSCULAR | Status: AC
  Administered 2023-12-04: 30 mg via INTRAMUSCULAR
  Filled 2023-12-04: qty 2

## 2023-12-04 NOTE — ED Triage Notes (Signed)
 Pt has laceration to the right lower leg and left shoulder is sore.  Pt feet got caught in the bumper of his tractor trailer and he fell.

## 2023-12-04 NOTE — Discharge Instructions (Signed)
 Your test results showed no major injuries.  Keep area of leg wound clean and dry.  Take ibuprofen  and Tylenol  as needed for headache and shoulder pain.  Wear sling for comfort.  Remove sling several times a day and practice gentle range of motion exercises.  A list of some of these is attached.  Follow-up with your orthopedic doctor as needed if you do have persistent pain or decreased function.  If you are unable to schedule an appointment with your orthopedist, call the telephone number below.  Return to the emergency department for any new or worsening symptoms of concern.

## 2023-12-04 NOTE — ED Provider Notes (Signed)
 [place] EMERGENCY DEPARTMENT AT [facility] Provider Note   CSN: [id number] Arrival date & time: 12/04/23  2022     Patient presents with: Laceration   Greg Alvarez is a 63 y.o. male.    Laceration Patient presents for right leg laceration and left shoulder pain.  Medical history includes CAD, HTN, HLD, DM, GERD, arthritis.  Last tetanus shot was 9 years ago.  Earlier today, patient was attempting to get down from his tractor.  His members approximately 4 feet off the ground.  His feet got caught on the bumper and this caused him to fall.  He tried to catch himself with his left arm but fell backwards.  He is unsure of exactly how he landed.  He did sustain a wound to his right lower leg.  He has had pain in this area as well as pain in his left shoulder since the fall.  He is left-handed.  Patient is unsure if he struck his head when he fell.  He does endorse a current headache.  He is on Plavix .     Prior to Admission medications   Medication Sig Start Date End Date Taking? Authorizing Provider  amLODipine (NORVASC) 5 MG tablet Take 5 mg by mouth daily. 10/30/19   [provider]  aspirin  81 MG chewable tablet Chew 81 mg by mouth daily. 04/25/14   [provider]  atorvastatin (LIPITOR) 40 MG tablet Take 40 mg by mouth at bedtime.    [provider]  clopidogrel  (PLAVIX ) 75 MG tablet TAKE 1 TABLET EVERY DAY WITH BREAKFAST 07/12/23   [place], Peter M, MD  cyclobenzaprine (FLEXERIL) 10 MG tablet Take 10 mg by mouth 3 (three) times daily as needed for muscle spasms. 04/20/21   [provider]  empagliflozin (JARDIANCE) 25 MG TABS tablet Take 12.5 mg by mouth daily. 05/15/22   [provider]  escitalopram (LEXAPRO) 10 MG tablet Take 10 mg by mouth daily. 08/24/22   [provider]  ezetimibe  (ZETIA ) 10 MG tablet TAKE 1 TABLET EVERY DAY 07/18/23   [place], Peter M, MD  hydrOXYzine (ATARAX) 25 MG tablet Take 25 mg by mouth 3  (three) times daily as needed for anxiety. 04/05/21   [provider]  isosorbide mononitrate (IMDUR) 30 MG 24 hr tablet Take 30 mg by mouth daily.    [provider]  losartan (COZAAR) 100 MG tablet Take 100 mg by mouth daily.    [provider]  meloxicam  (MOBIC ) 15 MG tablet Take 1 tablet (15 mg total) by mouth daily. 05/08/23   Hyatt, Max T, DPM  metFORMIN (GLUCOPHAGE-XR) 500 MG 24 hr tablet Take 500 mg by mouth in the morning and at bedtime.    [provider]  metoprolol  succinate (TOPROL -XL) 25 MG 24 hr tablet TAKE 1 TABLET EVERY DAY 09/01/22   [place], Peter M, MD  Mometasone Furoate [facility] HFA) 200 MCG/ACT AERO Inhale 2 puffs into the lungs daily.    [provider]  Multiple Vitamin (ONE-A-DAY MENS PO) Take 1 tablet by mouth daily.     [provider]  nitroGLYCERIN  (NITROSTAT ) 0.4 MG SL tablet Place 1 tablet (0.4 mg total) under the tongue every 5 (five) minutes as needed for chest pain. 02/13/23 02/13/24  Goodrich, Callie E, PA-C  Omega-3 Fatty Acids (FISH OIL) 1000 MG CAPS Take 1,000 mg by mouth 2 (two) times daily.    [provider]  pantoprazole  (PROTONIX ) 40 MG tablet Take 40 mg by  mouth daily. 10/30/19   [provider]  Tiotropium Bromide Monohydrate 1.25 MCG/ACT AERS Take 2 puffs by mouth daily. 10/13/22   [provider]  traMADol (ULTRAM) 50 MG tablet Take 50 mg by mouth 3 (three) times daily as needed. 04/18/23   [provider]  traZODone (DESYREL) 100 MG tablet Take 100 mg by mouth at bedtime.    [provider]    Allergies: Ativan [lorazepam] and Benazepril    Review of Systems  Musculoskeletal:  Positive for arthralgias.  Skin:  Positive for wound.  Neurological:  Positive for headaches.  All other systems reviewed and are negative.   Updated Vital Signs BP 120/72   Pulse 72   Temp 98.5 F (36.9 C) (Oral)   Resp 18   Wt 104.3 kg   SpO2 96%   BMI 37.12 kg/m    Physical Exam Vitals and nursing note reviewed.  Constitutional:      General: He is not in acute distress.    Appearance: Normal appearance. He is well-developed. He is not ill-appearing, toxic-appearing or diaphoretic.  HENT:     Head: Normocephalic and atraumatic.     Right Ear: External ear normal.     Left Ear: External ear normal.     Nose: Nose normal.     Mouth/Throat:     Mouth: Mucous membranes are moist.  Eyes:     Extraocular Movements: Extraocular movements intact.     Conjunctiva/sclera: Conjunctivae normal.  Cardiovascular:     Rate and Rhythm: Normal rate and regular rhythm.  Pulmonary:     Effort: Pulmonary effort is normal. No respiratory distress.  Abdominal:     General: There is no distension.     Palpations: Abdomen is soft.     Tenderness: There is no abdominal tenderness.  Musculoskeletal:        General: No swelling or deformity.     Cervical back: Normal range of motion and neck supple.  Skin:    General: Skin is warm and dry.     Coloration: Skin is not jaundiced or pale.  Neurological:     General: No focal deficit present.     Mental Status: He is alert and oriented to person, place, and time.     Cranial Nerves: No cranial nerve deficit.     Sensory: No sensory deficit.     Motor: No weakness.     Coordination: Coordination normal.  Psychiatric:        Mood and Affect: Mood normal.        Behavior: Behavior normal.     (all labs ordered are listed, but only abnormal results are displayed) Labs Reviewed - No data to display  EKG: None  Radiology: DG Chest Portable 1 View Result Date: 12/04/2023 CLINICAL DATA:  Clemens approx. 6pm today from the bed of a truck EXAM: PORTABLE CHEST 1 VIEW COMPARISON:  Chest x-ray 11/29/2020 FINDINGS: The heart and mediastinal contours are within normal limits. Low lung volumes. No focal consolidation. No pulmonary edema. No pleural effusion. No pneumothorax. No acute osseous abnormality. IMPRESSION: Low  lung volumes with no active disease. Electronically Signed   By: Morgane  Naveau M.D.   On: 12/04/2023 22:44   DG Tibia/Fibula Right Result Date: 12/04/2023 CLINICAL DATA:  fall EXAM: RIGHT TIBIA AND FIBULA - 2 VIEW COMPARISON:  None Available. FINDINGS: There is no evidence of fracture or other focal bone lesions. Knee and ankle grossly unremarkable. Soft tissues are unremarkable. Vascular calcifications. IMPRESSION: No  acute displaced fracture or dislocation. Electronically Signed   By: Morgane  Naveau M.D.   On: 12/04/2023 22:43   CT Cervical Spine Wo Contrast Result Date: 12/04/2023 EXAM: CT CERVICAL SPINE WITHOUT CONTRAST 12/04/2023 10:30:15 PM TECHNIQUE: CT of the cervical spine was performed without the administration of intravenous contrast. Multiplanar reformatted images are provided for review. Automated exposure control, iterative reconstruction, and/or weight-based adjustment of the mA/kV was utilized to reduce the radiation dose to as low as reasonably achievable. COMPARISON: None available. CLINICAL HISTORY: Neck trauma, dangerous injury mechanism (Age 36-64y). Pt feet got caught in the bumper of his tractor trailer and he fell. Head and neck pain. FINDINGS: CERVICAL SPINE: BONES AND ALIGNMENT: 2 mm anterolisthesis is seen at C2-C3, likely degenerative in nature. Overall straightening of the cervical spine is noted. No traumatic listhesis is identified. DEGENERATIVE CHANGES: Disc space narrowing is seen at C4-C7, in keeping with changes of mild-to-moderate degenerative disc disease, most severe at C6-C7. Multilevel facet arthrosis is noted. SOFT TISSUES: No prevertebral soft tissue swelling. No high-grade neural foraminal narrowing or high-grade canal stenosis is identified. Vertebral body height is preserved. No acute fracture of the cervical spine is seen. IMPRESSION: 1. No acute fracture or traumatic listhesis of the cervical spine. 2. Mild-to-moderate degenerative disc disease at C4-T1, most  severe at C6-7, with multilevel facet arthrosis and 2 mm anterolisthesis at C2-3, likely degenerative. Electronically signed by: Dorethia Molt MD 12/04/2023 10:39 PM EDT RP Workstation: [id number]   CT HEAD WO CONTRAST Result Date: 12/04/2023 EXAM: CT HEAD WITHOUT CONTRAST 12/04/2023 10:30:15 PM TECHNIQUE: CT of the head was performed without the administration of intravenous contrast. Automated exposure control, iterative reconstruction, and/or weight based adjustment of the mA/kV was utilized to reduce the radiation dose to as low as reasonably achievable. COMPARISON: None available. CLINICAL HISTORY: Head trauma, moderate-severe. Pt feet got caught in the bumper of his tractor trailer and he fell. Head and neck pain. FINDINGS: BRAIN AND VENTRICLES: No acute hemorrhage. No evidence of acute infarct. No hydrocephalus. No extra-axial collection. No mass effect or midline shift. ORBITS: No acute abnormality. SINUSES: No acute abnormality. SOFT TISSUES AND SKULL: No acute soft tissue abnormality. No skull fracture. IMPRESSION: 1. No acute intracranial abnormality. Electronically signed by: Dorethia Molt MD 12/04/2023 10:34 PM EDT RP Workstation: [id number]   DG Shoulder Left Result Date: 12/04/2023 CLINICAL DATA:  Left shoulder pain after fell off of a truck today. Injury. EXAM: LEFT SHOULDER - 2+ VIEW COMPARISON:  None Available. FINDINGS: Degenerative changes in the glenohumeral joint with joint space narrowing, sclerosis, and osteophyte formation. Old appearing ununited ossicles inferior to the inferior glenoid and lateral to the humeral head may indicate calcific tendinosis or osteophyte formation. No evidence of acute fracture or dislocation. Coracoclavicular and acromioclavicular spaces are normal. Soft tissues are unremarkable. IMPRESSION: Degenerative changes in the left shoulder. No acute displaced fractures identified. Electronically Signed   By: Elsie Gravely M.D.   On: 12/04/2023 21:05      Procedures   Medications Ordered in the ED  ketorolac (TORADOL) injection 30 mg (has no administration in time range)  metoCLOPramide (REGLAN) tablet 10 mg (10 mg Oral Given 12/04/23 2244)  diphenhydrAMINE (BENADRYL) capsule 25 mg (25 mg Oral Given 12/04/23 2244)  oxyCODONE -acetaminophen  (PERCOCET/ROXICET) 5-325 MG per tablet 1 tablet (1 tablet Oral Given 12/04/23 2244)  lidocaine -EPINEPHrine (XYLOCAINE  W/EPI) 2 %-1:200000 (PF) injection 10 mL (10 mLs Intradermal Given 12/04/23 2247)  Tdap (ADACEL) injection 0.5 mL (0.5 mLs Intramuscular Given 12/04/23 2246)  Medical Decision Making Amount and/or Complexity of Data Reviewed Radiology: ordered.  Risk Prescription drug management.   Patient presenting after fall from elevated platform.  On arrival in the ED, vital signs are normal.  Patient is well-appearing on exam.  He endorses left shoulder pain which he attributes to trying to catch himself as he fell.  He underwent x-ray imaging which did not show any acute osseous or joint abnormalities.  I suspect a rotator cuff injury.  He is left-handed.  Sling was provided.  Patient also endorses a current headache.  He is unsure if he struck his head.  Will obtain CT imaging of head and cervical spine.  Reglan, Benadryl and Percocet ordered for analgesia.  Patient has been ambulatory since fall.  He does have a hemostatic laceration to the lateral aspect of his right calf.  Tetanus was updated.  CT imaging did not show any acute findings.  X-ray of right lower extremity showed no foreign bodies.  Dried blood was cleaned away from wound.  Underneath was simply a superficial abrasion, as shown below:  Xeroform and Kerlix dressing was applied.  Patient had mild improvement in his headache after Reglan and Benadryl.  Dose of Toradol was ordered for ongoing analgesia.  Patient was discharged in stable condition.     Final diagnoses:  Fall, initial encounter   Acute pain of left shoulder  Abrasion of right lower extremity, initial encounter    ED Discharge Orders     None          Melvenia Motto, MD 12/04/23 2327

## 2023-12-21 ENCOUNTER — Other Ambulatory Visit: Payer: Self-pay | Admitting: Cardiology

## 2023-12-21 DIAGNOSIS — E7800 Pure hypercholesterolemia, unspecified: Secondary | ICD-10-CM

## 2023-12-31 ENCOUNTER — Encounter: Payer: Self-pay | Admitting: Radiology

## 2024-01-07 ENCOUNTER — Inpatient Hospital Stay (HOSPITAL_COMMUNITY)
Admission: EM | Admit: 2024-01-07 | Discharge: 2024-01-10 | DRG: 251 | Disposition: A | Discharge status: Home / Self Care | Attending: Hospitalist | Admitting: Hospitalist

## 2024-01-07 ENCOUNTER — Encounter (HOSPITAL_COMMUNITY): Payer: Self-pay | Admitting: Emergency Medicine

## 2024-01-07 ENCOUNTER — Emergency Department (HOSPITAL_COMMUNITY)

## 2024-01-07 ENCOUNTER — Other Ambulatory Visit: Payer: Self-pay

## 2024-01-07 DIAGNOSIS — G4700 Insomnia, unspecified: Secondary | ICD-10-CM | POA: Diagnosis present

## 2024-01-07 DIAGNOSIS — Z9049 Acquired absence of other specified parts of digestive tract: Secondary | ICD-10-CM

## 2024-01-07 DIAGNOSIS — Z955 Presence of coronary angioplasty implant and graft: Secondary | ICD-10-CM

## 2024-01-07 DIAGNOSIS — I25110 Atherosclerotic heart disease of native coronary artery with unstable angina pectoris: Secondary | ICD-10-CM | POA: Diagnosis not present

## 2024-01-07 DIAGNOSIS — E119 Type 2 diabetes mellitus without complications: Secondary | ICD-10-CM | POA: Diagnosis present

## 2024-01-07 DIAGNOSIS — Z888 Allergy status to other drugs, medicaments and biological substances status: Secondary | ICD-10-CM

## 2024-01-07 DIAGNOSIS — R079 Chest pain, unspecified: Secondary | ICD-10-CM | POA: Diagnosis not present

## 2024-01-07 DIAGNOSIS — M069 Rheumatoid arthritis, unspecified: Secondary | ICD-10-CM | POA: Diagnosis present

## 2024-01-07 DIAGNOSIS — K219 Gastro-esophageal reflux disease without esophagitis: Secondary | ICD-10-CM | POA: Diagnosis present

## 2024-01-07 DIAGNOSIS — Z7951 Long term (current) use of inhaled steroids: Secondary | ICD-10-CM

## 2024-01-07 DIAGNOSIS — Z7982 Long term (current) use of aspirin: Secondary | ICD-10-CM

## 2024-01-07 DIAGNOSIS — F4310 Post-traumatic stress disorder, unspecified: Secondary | ICD-10-CM | POA: Diagnosis present

## 2024-01-07 DIAGNOSIS — Z791 Long term (current) use of non-steroidal anti-inflammatories (NSAID): Secondary | ICD-10-CM

## 2024-01-07 DIAGNOSIS — Z79899 Other long term (current) drug therapy: Secondary | ICD-10-CM

## 2024-01-07 DIAGNOSIS — F40240 Claustrophobia: Secondary | ICD-10-CM | POA: Diagnosis present

## 2024-01-07 DIAGNOSIS — Z87891 Personal history of nicotine dependence: Secondary | ICD-10-CM

## 2024-01-07 DIAGNOSIS — E7800 Pure hypercholesterolemia, unspecified: Secondary | ICD-10-CM | POA: Diagnosis present

## 2024-01-07 DIAGNOSIS — Z6837 Body mass index (BMI) 37.0-37.9, adult: Secondary | ICD-10-CM

## 2024-01-07 DIAGNOSIS — I10 Essential (primary) hypertension: Secondary | ICD-10-CM | POA: Diagnosis present

## 2024-01-07 DIAGNOSIS — T82855A Stenosis of coronary artery stent, initial encounter: Secondary | ICD-10-CM | POA: Diagnosis present

## 2024-01-07 DIAGNOSIS — E785 Hyperlipidemia, unspecified: Secondary | ICD-10-CM | POA: Diagnosis present

## 2024-01-07 DIAGNOSIS — I2510 Atherosclerotic heart disease of native coronary artery without angina pectoris: Secondary | ICD-10-CM

## 2024-01-07 DIAGNOSIS — Y831 Surgical operation with implant of artificial internal device as the cause of abnormal reaction of the patient, or of later complication, without mention of misadventure at the time of the procedure: Secondary | ICD-10-CM | POA: Diagnosis present

## 2024-01-07 DIAGNOSIS — I440 Atrioventricular block, first degree: Secondary | ICD-10-CM | POA: Diagnosis present

## 2024-01-07 DIAGNOSIS — Z7984 Long term (current) use of oral hypoglycemic drugs: Secondary | ICD-10-CM

## 2024-01-07 DIAGNOSIS — G4733 Obstructive sleep apnea (adult) (pediatric): Secondary | ICD-10-CM | POA: Diagnosis present

## 2024-01-07 DIAGNOSIS — I200 Unstable angina: Secondary | ICD-10-CM | POA: Diagnosis present

## 2024-01-07 DIAGNOSIS — E66812 Obesity, class 2: Secondary | ICD-10-CM | POA: Diagnosis present

## 2024-01-07 DIAGNOSIS — F410 Panic disorder [episodic paroxysmal anxiety] without agoraphobia: Secondary | ICD-10-CM | POA: Diagnosis present

## 2024-01-07 DIAGNOSIS — E669 Obesity, unspecified: Secondary | ICD-10-CM | POA: Diagnosis present

## 2024-01-07 DIAGNOSIS — I252 Old myocardial infarction: Secondary | ICD-10-CM

## 2024-01-07 DIAGNOSIS — Z8249 Family history of ischemic heart disease and other diseases of the circulatory system: Secondary | ICD-10-CM

## 2024-01-07 DIAGNOSIS — Z7902 Long term (current) use of antithrombotics/antiplatelets: Secondary | ICD-10-CM

## 2024-01-07 LAB — CBC
HCT: 45 % (ref 39.0–52.0)
Hemoglobin: 15.3 g/dL (ref 13.0–17.0)
MCH: 31.2 pg (ref 26.0–34.0)
MCHC: 34 g/dL (ref 30.0–36.0)
MCV: 91.8 fL (ref 80.0–100.0)
Platelets: 279 K/uL (ref 150–400)
RBC: 4.9 MIL/uL (ref 4.22–5.81)
RDW: 13 % (ref 11.5–15.5)
WBC: 9.5 K/uL (ref 4.0–10.5)
nRBC: 0 % (ref 0.0–0.2)

## 2024-01-07 LAB — TROPONIN T, HIGH SENSITIVITY
Troponin T High Sensitivity: <15 ng/L (ref 0–19)
Troponin T High Sensitivity: <15 ng/L (ref 0–19)

## 2024-01-07 LAB — BASIC METABOLIC PANEL WITH GFR
Anion gap: 12 (ref 5–15)
BUN: 16 mg/dL (ref 8–23)
CO2: 27 mmol/L (ref 22–32)
Calcium: 9.8 mg/dL (ref 8.9–10.3)
Chloride: 102 mmol/L (ref 98–111)
Creatinine, Ser: 0.9 mg/dL (ref 0.61–1.24)
GFR, Estimated: >60 mL/min (ref >60)
Glucose, Bld: 114 mg/dL — ABNORMAL HIGH (ref 70–99)
Potassium: 4.3 mmol/L (ref 3.5–5.1)
Sodium: 141 mmol/L (ref 135–145)

## 2024-01-07 MED ORDER — NITROGLYCERIN 0.4 MG SL SUBL
0.4000 mg | SUBLINGUAL_TABLET | Freq: Once | SUBLINGUAL | Status: AC
  Administered 2024-01-07: 0.4 mg via SUBLINGUAL
  Filled 2024-01-07: qty 1

## 2024-01-07 MED ORDER — TRAZODONE HCL 100 MG PO TABS
100.0000 mg | ORAL_TABLET | Freq: Every day | ORAL | Status: DC
  Administered 2024-01-07 – 2024-01-09 (×3): 100 mg via ORAL
  Filled 2024-01-07 (×2): qty 1
  Filled 2024-01-07: qty 2

## 2024-01-07 MED ORDER — FENTANYL CITRATE (PF) 100 MCG/2ML IJ SOLN
50.0000 ug | Freq: Once | INTRAMUSCULAR | Status: AC
  Administered 2024-01-07: 50 ug via INTRAVENOUS
  Filled 2024-01-07: qty 2

## 2024-01-07 MED ORDER — MELATONIN 3 MG PO TABS: 6.0000 mg | ORAL_TABLET | Freq: Every evening | ORAL | Status: DC | PRN

## 2024-01-07 MED ORDER — PROCHLORPERAZINE EDISYLATE 10 MG/2ML IJ SOLN: 5.0000 mg | Freq: Four times a day (QID) | INTRAMUSCULAR | Status: DC | PRN

## 2024-01-07 MED ORDER — EZETIMIBE 10 MG PO TABS
10.0000 mg | ORAL_TABLET | Freq: Every day | ORAL | Status: DC
  Administered 2024-01-08 – 2024-01-10 (×3): 10 mg via ORAL
  Filled 2024-01-07 (×4): qty 1

## 2024-01-07 MED ORDER — ENOXAPARIN SODIUM 60 MG/0.6ML IJ SOSY
0.5000 mg/kg | PREFILLED_SYRINGE | INTRAMUSCULAR | Status: DC
  Administered 2024-01-07: 52.5 mg via SUBCUTANEOUS
  Filled 2024-01-07: qty 0.6

## 2024-01-07 MED ORDER — POLYETHYLENE GLYCOL 3350 17 G PO PACK: 17.0000 g | PACK | Freq: Every day | ORAL | Status: DC | PRN

## 2024-01-07 MED ORDER — ALUM & MAG HYDROXIDE-SIMETH 200-200-20 MG/5ML PO SUSP
30.0000 mL | Freq: Once | ORAL | Status: AC
  Administered 2024-01-07: 30 mL via ORAL
  Filled 2024-01-07: qty 30

## 2024-01-07 MED ORDER — ATORVASTATIN CALCIUM 40 MG PO TABS
40.0000 mg | ORAL_TABLET | Freq: Every day | ORAL | Status: DC
  Administered 2024-01-07 – 2024-01-09 (×3): 40 mg via ORAL
  Filled 2024-01-07 (×3): qty 1

## 2024-01-07 MED ORDER — ACETAMINOPHEN 500 MG PO TABS
500.0000 mg | ORAL_TABLET | Freq: Four times a day (QID) | ORAL | Status: DC | PRN
Start: 2024-01-07 — End: 2024-01-10
  Administered 2024-01-09: 500 mg via ORAL
  Filled 2024-01-07: qty 1

## 2024-01-07 NOTE — ED Triage Notes (Signed)
 Pt reports chest pain x 2 days, worsening today with radiation to his back and nausea, hx of MI and stent placement per pt

## 2024-01-07 NOTE — ED Provider Notes (Signed)
 [place] EMERGENCY DEPARTMENT AT [facility] Provider Note   CSN: [id number] Arrival date & time: 01/07/24  8194     Patient presents with: Chest Pain   Greg Alvarez is a 63 y.o. male.    Chest Pain Patient presents with chest pain.  Has had 2 to 3 days.  Had been coming going somewhat.  Not exertional.  Now constant.  Worse with  laying down.  Left anterior chest.  States it feels like previous coronary artery disease.  No fevers.  No cough.  No swelling in his legs.    Past Medical History:  Diagnosis Date   Coronary artery disease    Diabetes mellitus type 2, noninsulin dependent (HCC)    GERD (gastroesophageal reflux disease)    High cholesterol    Hypertension    under control with med., has been on med. x 10 yr.   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 Authorizing Provider  amLODipine (NORVASC) 5 MG tablet Take 5 mg by mouth daily. 10/30/19   [provider]  aspirin  81 MG chewable tablet Chew 81 mg by mouth daily. 04/25/14   [provider]  atorvastatin (LIPITOR) 40 MG tablet Take 40 mg by mouth at bedtime.    [provider]  clopidogrel  (PLAVIX ) 75 MG tablet TAKE 1 TABLET EVERY DAY WITH BREAKFAST 07/12/23   Jordan, Peter M, MD  cyclobenzaprine (FLEXERIL) 10 MG tablet Take 10 mg by mouth 3 (three) times daily as needed for muscle spasms. 04/20/21   [provider]  empagliflozin (JARDIANCE) 25 MG TABS tablet Take 12.5 mg by mouth daily. 05/15/22   [provider]  escitalopram (LEXAPRO) 10 MG tablet Take 10 mg by mouth daily. 08/24/22   [provider]  ezetimibe  (ZETIA ) 10 MG tablet TAKE 1 TABLET EVERY DAY 12/24/23    Jordan, Peter M, MD  hydrOXYzine (ATARAX) 25 MG tablet Take 25 mg by mouth 3 (three) times daily as needed for anxiety. 04/05/21   [provider]  isosorbide mononitrate (IMDUR) 30 MG 24 hr tablet Take 30 mg by mouth daily.    [provider]  losartan (COZAAR) 100 MG tablet Take 100 mg by mouth daily.    [provider]  meloxicam  (MOBIC ) 15 MG tablet Take 1 tablet (15 mg total) by mouth daily. 05/08/23   Hyatt, Max T, DPM  metFORMIN (GLUCOPHAGE-XR) 500 MG 24 hr tablet Take 500 mg by mouth in the morning and at bedtime.    [provider]  metoprolol  succinate (TOPROL -XL) 25 MG 24 hr tablet TAKE 1 TABLET EVERY DAY 09/01/22   Jordan, Peter M, MD  Mometasone Furoate [facility] HFA) 200 MCG/ACT AERO Inhale 2 puffs into the lungs daily.    [provider]  Multiple Vitamin (ONE-A-DAY MENS PO) Take 1 tablet by mouth daily.     [provider]  nitroGLYCERIN  (NITRODUR - DOSED IN MG/24 HR) 0.1 mg/hr patch Place 0.1 mg onto the skin daily as needed (chest pain).    [provider]  Omega-3 Fatty Acids (FISH OIL) 1000 MG CAPS Take 1,000 mg by mouth 2 (two) times  daily.    [provider]  pantoprazole  (PROTONIX ) 40 MG tablet Take 40 mg by mouth daily. 10/30/19   [provider]  Tiotropium Bromide Monohydrate 1.25 MCG/ACT AERS Take 2 puffs by mouth daily. 10/13/22   [provider]  traMADol (ULTRAM) 50 MG tablet Take 50 mg by mouth 3 (three) times daily as needed. 04/18/23   [provider]  traZODone (DESYREL) 100 MG tablet Take 100 mg by mouth at bedtime.    [provider]    Allergies: Ativan [lorazepam] and Benazepril    Review of Systems  Cardiovascular:  Positive for chest pain.    Updated Vital Signs BP 138/87   Pulse (!) 58   Temp (!) 97 F (36.1 C)   Resp 20   Ht 5' 6 (1.676 m)   Wt 104.3 kg   SpO2 90%   BMI 37.12 kg/m   Physical Exam Vitals and nursing note reviewed.   Cardiovascular:     Rate and Rhythm: Regular rhythm.  Chest:     Chest wall: Tenderness present.     Comments:  tenderness to left anterior lower chest wall. Abdominal:     Tenderness: There is no abdominal tenderness.  Musculoskeletal:     Right lower leg: No edema.     Left lower leg: No edema.  Neurological:     Mental Status: He is alert.     (all labs ordered are listed, but only abnormal results are displayed) Labs Reviewed  BASIC METABOLIC PANEL WITH GFR - Abnormal; Notable for the following components:      Result Value   Glucose, Bld 114 (*)    All other components within normal limits  CBC  TROPONIN T, HIGH SENSITIVITY    EKG: EKG Interpretation Date/Time:  Monday January 07 2024 18:11:21 EST Ventricular Rate:  61 PR Interval:  216 QRS Duration:  94 QT Interval:  440 QTC Calculation: 442 R Axis:   -9  Text Interpretation: Sinus rhythm with 1st degree A-V block Minimal voltage criteria for LVH, may be normal variant ( R in aVL ) Inferior infarct , age undetermined Cannot rule out Anterior infarct , age undetermined Abnormal ECG When compared with ECG of 13-Feb-2023 11:15, Inferior infarct is now Present Nonspecific T wave abnormality now evident in Lateral leads Confirmed by Patsey Lot [phone]) on 01/07/2024 6:18:00 PM  Radiology: [facility] Chest Port 1 View Result Date: 01/07/2024 CLINICAL DATA:  Chest pain EXAM: PORTABLE CHEST 1 VIEW COMPARISON:  Chest x-ray 12/04/2023 FINDINGS: The heart size and mediastinal contours are within normal limits. Both lungs are clear. The visualized skeletal structures are unremarkable. IMPRESSION: No active disease. Electronically Signed   By: Greig Pique M.D.   On: 01/07/2024 19:00     Procedures   Medications Ordered in the ED  alum & mag hydroxide-simeth (MAALOX/MYLANTA) 200-200-20 MG/5ML suspension 30 mL (has no administration in time range)  nitroGLYCERIN  (NITROSTAT ) SL tablet 0.4 mg (0.4 mg Sublingual Given 01/07/24  1828)  fentaNYL  (SUBLIMAZE ) injection 50 mcg (50 mcg Intravenous Given 01/07/24 1831)                HEART Score: 5                    Medical Decision Making Amount and/or Complexity of Data Reviewed Labs: ordered.  Risk Prescription drug management. Decision regarding hospitalization.   Patient with anterior chest wall.  Lower chest.  EKG has some nonspecific T wave changes.  Does  have a history of coronary disease.  Reviewed previous catheter report.  Differential diagnoses does include coronary artery disease/ACS but also cause such chest wall pain rash pneumonia.  EKG reassuring.  Troponin negative.  However heart score of 5 which would put moderate risk.  I think with higher heart score patient would benefit from admission to hospital for further workup.  Other causes potentially include GI cause or chest wall pain but I think it is too high risk for discharge home.        Final diagnoses:  Nonspecific chest pain    ED Discharge Orders     None          Patsey Lot, MD 01/07/24 1907

## 2024-01-07 NOTE — H&P (Signed)
 History and Physical  Greg Alvarez FMW:[id number] DOB: 1960/07/07 DOA: 01/07/2024  Referring physician: Dr. Patsey, EDP  PCP: Dasie Perkins, PA  Outpatient Specialists: Cardiology Patient coming from: Home  Chief Complaint: Chest pain.  HPI: Greg Alvarez is a 63 y.o. male with medical history significant for coronary artery disease status post PCI with stent placement on DAPT aspirin  Plavix , hypertension, hyperlipidemia, type 2 diabetes, OSA, who presents to the ER due to complaints of left-sided chest pain.  This started on Friday, 4 days ago and has been intermittent with no improving factor.  Was seen by taking a deep breath and with movement.  The pain has been sharp on the left side of the chest and radiating to the left side of his back.  Associated with shortness of breath.  7 out of 10 in intensity.  Took a nitroglycerin  2 days ago without improvement.  In the ER, 12 EKG showing sinus rhythm with first-degree AV block rate of 61.  QTc 442.  High-sensitivity troponin negative x 2.  States he has been compliant with his cardiac medications.  He took his aspirin  and Plavix  this morning.  He takes his home Lipitor at night.  Admitted by [facility] for chest pain rule out ACS.    At the time of this visit, the patient is alert and oriented x 3.  Not in acute distress.  Left chest wall tenderness noted on exam.  A transthoracic echocardiogram has been ordered and is pending.    Patient has a family history of premature coronary artery disease in his father who had an MI and CABG at the age of 15, and his brother who was diagnosed with coronary artery disease at the age of 74.  ED Course: Temperature 98.  BP 106/66, pulse 58, respiration rate 16, O2 saturation 93% on room air.  Review of Systems: Review of systems as noted in the HPI. All other systems reviewed and are negative.   Past Medical History:  Diagnosis Date   Coronary artery disease    Diabetes mellitus type 2, noninsulin  dependent (HCC)    GERD (gastroesophageal reflux disease)    High cholesterol    Hypertension    under control with med., has been on med. x 10 yr.   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   left shoulder, states unable to raise left arm   Myocardial infarction [facility])    Panic attacks    Rheumatoid arthritis (HCC)    Sleep apnea    Tenosynovitis of wrist 03/2013   right   Wears glasses    Past Surgical History:  Procedure Laterality Date   APPENDECTOMY  02/27/1978   BIOPSY  04/11/2018   Procedure: BIOPSY;  Surgeon: Shaaron Lamar HERO, MD;  [facility];  Service: Endoscopy;;  esophagus   BIOPSY  07/21/2021   Procedure: BIOPSY;  Surgeon: Shaaron Lamar HERO, MD;  [facility];  Service: Endoscopy;;   CARDIAC CATHETERIZATION  02/28/2000   1 stent placed in LAD   CARPAL TUNNEL RELEASE Left 07/29/2013   Procedure: LEFT CARPAL TUNNEL RELEASE;  Surgeon: Arley JONELLE Curia, MD;  Location: [facility];  Service: Orthopedics;  Laterality: Left;   COLONOSCOPY N/A 01/12/2014   Procedure: COLONOSCOPY;  Surgeon: Lamar HERO Shaaron, MD;  [facility];  Service: Endoscopy;  Laterality: N/A;  9:30 AM   COLONOSCOPY WITH PROPOFOL  N/A 07/21/2021   Procedure: COLONOSCOPY WITH PROPOFOL ;  Surgeon: Shaaron Lamar HERO, MD;  [facility];  Service: Endoscopy;  Laterality: N/A;  10:30am   CORONARY LITHOTRIPSY N/A 02/13/2023   Procedure: CORONARY LITHOTRIPSY;  Surgeon: Jordan, Peter M, MD;  Location: [facility] INVASIVE CV LAB;  Service: Cardiovascular;  Laterality: N/A;   CORONARY STENT INTERVENTION N/A 02/13/2023   Procedure: CORONARY STENT INTERVENTION;  Surgeon: Jordan, Peter M, MD;  Location: [facility] INVASIVE CV LAB;  Service: Cardiovascular;  Laterality: N/A;   ESOPHAGOGASTRODUODENOSCOPY (EGD) WITH PROPOFOL  N/A 04/11/2018   Procedure: ESOPHAGOGASTRODUODENOSCOPY (EGD) WITH PROPOFOL ;  Surgeon: Shaaron Lamar HERO, MD;  [facility];  Service: Endoscopy;  Laterality: N/A;   1:15pm   ESOPHAGOGASTRODUODENOSCOPY (EGD) WITH PROPOFOL  N/A 07/21/2021   Procedure: ESOPHAGOGASTRODUODENOSCOPY (EGD) WITH PROPOFOL ;  Surgeon: Shaaron Lamar HERO, MD;  [facility];  Service: Endoscopy;  Laterality: N/A;   LEFT HEART CATH AND CORONARY ANGIOGRAPHY N/A 08/09/2021   Procedure: LEFT HEART CATH AND CORONARY ANGIOGRAPHY;  Surgeon: Claudene Victory ORN, MD;  Location: [facility] INVAS[facility] CV LAB;  Service: Cardiovascular;  Laterality: N/A;   LEFT HEART CATH AND CORONARY ANGIOGRAPHY N/A 02/13/2023   Procedure: LEFT HEART CATH AND CORONARY ANGIOGRAPHY;  Surgeon: Jordan, Peter M, MD;  Location: [facility] INVASIVE CV LAB;  Service: Cardiovascular;  Laterality: N/A;   SHOULDER ARTHROSCOPY W/ ROTATOR CUFF REPAIR  05/28/2013   left-DSC   TENDON REPAIR Right    hand   TENOSYNOVECTOMY Right 04/08/2013   Procedure: TENOSYNOVECTOMY/FLEXOR TENDONS RIGHT WRIST;  Surgeon: Arley JONELLE Curia, MD;  Location: [facility];  Service: Orthopedics;  Laterality: Right;    Social History:  reports that he quit smoking about 25 years ago. His smoking use included cigarettes. He has never used smokeless tobacco. He reports that he does not currently use alcohol. He reports that he does not use drugs.   Allergies  Allergen Reactions   Ativan [Lorazepam] Other (See Comments)    Hyperactivity    Benazepril Cough    Family History  Problem Relation Age of Onset   Anesthesia problems Sister        hard to wake up post-op   Parkinson's disease Sister    Multiple sclerosis Sister    Epilepsy Mother    Heart disease Father    Parkinson's disease Father    Heart failure Father    Heart attack Father    Heart disease Brother    Heart attack Brother    Stroke Brother    Colon cancer Neg Hx    Gastric cancer Neg Hx    Esophageal cancer Neg Hx       Prior to Admission medications   Medication Sig Start Date End Date Taking? Authorizing Provider  amLODipine (NORVASC) 5 MG tablet Take 5 mg by mouth daily.  10/30/19   [provider]  aspirin  81 MG chewable tablet Chew 81 mg by mouth daily. 04/25/14   [provider]  atorvastatin (LIPITOR) 40 MG tablet Take 40 mg by mouth at bedtime.    [provider]  clopidogrel  (PLAVIX ) 75 MG tablet TAKE 1 TABLET EVERY DAY WITH BREAKFAST 07/12/23   Jordan, Peter M, MD  cyclobenzaprine (FLEXERIL) 10 MG tablet Take 10 mg by mouth 3 (three) times daily as needed for muscle spasms. 04/20/21   [provider]  empagliflozin (JARDIANCE) 25 MG TABS tablet Take 12.5 mg by mouth daily. 05/15/22   [provider]  escitalopram (LEXAPRO) 10 MG tablet Take 10 mg by mouth daily. 08/24/22   [provider]  ezetimibe  (ZETIA ) 10 MG  tablet TAKE 1 TABLET EVERY DAY 12/24/23   Jordan, Peter M, MD  hydrOXYzine (ATARAX) 25 MG tablet Take 25 mg by mouth 3 (three) times daily as needed for anxiety. 04/05/21   [provider]  isosorbide mononitrate (IMDUR) 30 MG 24 hr tablet Take 30 mg by mouth daily.    [provider]  losartan (COZAAR) 100 MG tablet Take 100 mg by mouth daily.    [provider]  meloxicam  (MOBIC ) 15 MG tablet Take 1 tablet (15 mg total) by mouth daily. 05/08/23   Hyatt, Max T, DPM  metFORMIN (GLUCOPHAGE-XR) 500 MG 24 hr tablet Take 500 mg by mouth in the morning and at bedtime.    [provider]  metoprolol  succinate (TOPROL -XL) 25 MG 24 hr tablet TAKE 1 TABLET EVERY DAY 09/01/22   Jordan, Peter M, MD  Mometasone Furoate [facility] HFA) 200 MCG/ACT AERO Inhale 2 puffs into the lungs daily.    [provider]  Multiple Vitamin (ONE-A-DAY MENS PO) Take 1 tablet by mouth daily.     [provider]  nitroGLYCERIN  (NITRODUR - DOSED IN MG/24 HR) 0.1 mg/hr patch Place 0.1 mg onto the skin daily as needed (chest pain).    [provider]  Omega-3 Fatty Acids (FISH OIL) 1000 MG CAPS Take 1,000 mg by mouth 2 (two) times daily.    [provider]  pantoprazole   (PROTONIX ) 40 MG tablet Take 40 mg by mouth daily. 10/30/19   [provider]  Tiotropium Bromide Monohydrate 1.25 MCG/ACT AERS Take 2 puffs by mouth daily. 10/13/22   [provider]  traMADol (ULTRAM) 50 MG tablet Take 50 mg by mouth 3 (three) times daily as needed. 04/18/23   [provider]  traZODone (DESYREL) 100 MG tablet Take 100 mg by mouth at bedtime.    [provider]    Physical Exam: BP 128/84 (BP Location: Left Arm)   Pulse (!) 58   Temp 97.8 F (36.6 C) (Oral)   Resp 18   Ht 5' 6 (1.676 m)   Wt 104.3 kg   SpO2 96%   BMI 37.12 kg/m   General: 63 y.o. year-old male well developed well nourished in no acute distress.  Alert and oriented x3. Cardiovascular: Regular rate and rhythm with no rubs or gallops.  No thyromegaly or JVD noted.  No lower extremity edema. 2/4 pulses in all 4 extremities.  Chest wall tenderness noted on exam. Respiratory: Clear to auscultation with no wheezes or rales. Good inspiratory effort. Abdomen: Soft nontender nondistended with normal bowel sounds x4 quadrants. Muskuloskeletal: No cyanosis, clubbing or edema noted bilaterally Neuro: CN II-XII intact, strength, sensation, reflexes Skin: No ulcerative lesions noted or rashes Psychiatry: Judgement and insight appear normal. Mood is appropriate for condition and setting          Labs on Admission:  Basic Metabolic Panel: Recent Labs  Lab 01/07/24 1820  NA 141  K 4.3  CL 102  CO2 27  GLUCOSE 114*  BUN 16  CREATININE 0.90  CALCIUM 9.8   Liver Function Tests: No results for input(s): AST, ALT, ALKPHOS, BILITOT, PROT, ALBUMIN in the last 168 hours. No results for input(s): LIPASE, AMYLASE in the last 168 hours. No results for input(s): AMMONIA in the last 168 hours. CBC: Recent Labs  Lab 01/07/24 1820  WBC 9.5  HGB 15.3  HCT 45.0  MCV 91.8  PLT 279   Cardiac Enzymes: No results for input(s): CKTOTAL, CKMB, CKMBINDEX,  TROPONINI in  the last 168 hours.  BNP (last 3 results) No results for input(s): BNP in the last 8760 hours.  ProBNP (last 3 results) No results for input(s): PROBNP in the last 8760 hours.  CBG: No results for input(s): GLUCAP in the last 168 hours.  Radiological Exams on Admission: DG Chest Port 1 View Result Date: 01/07/2024 CLINICAL DATA:  Chest pain EXAM: PORTABLE CHEST 1 VIEW COMPARISON:  Chest x-ray 12/04/2023 FINDINGS: The heart size and mediastinal contours are within normal limits. Both lungs are clear. The visualized skeletal structures are unremarkable. IMPRESSION: No active disease. Electronically Signed   By: Greig Pique M.D.   On: 01/07/2024 19:00    EKG: I independently viewed the EKG done and my findings are as followed: Sinus rhythm rate of 61.  With first-degree AV block.  QTc 442.  Assessment/Plan Present on Admission:  Chest pain  Principal Problem:   Chest pain  Atypical chest pain rule out ACS Not relieved by rest, reproducible with palpation and deep inspiration. Twelve-lead EKG with no evidence of acute ischemia First 2 troponins negative x 2 Follow transthoracic echocardiogram Continue to monitor on telemetry Continue home cardiac medications.  Coronary artery disease status post PCI with stenting Resume home DAPT and statin Hold off beta-blocker due to soft BPs and bradycardia in the 50s Continue to monitor on telemetry  Type 2 diabetes, blood sugar is controlled Last A1c 6.7 in 2010 Update hemoglobin A1c Short acting insulin if needed.  GERD Resume home regimen  Insomnia Resume home trazodone  OSA CPAP nightly  Obesity BMI 37 Recommend weight loss outpatient with regular physical activity and healthy dieting.    Time: 75 minutes.    DVT prophylaxis: Subcu Lovenox daily.  Code Status: Full code.  Family Communication: None at bedside.  Disposition Plan: Admitted to telemetry unit.  Consults called: None.   Consider consulting cardiology in the morning.  Admission status: Observation.   Status is: Observation    Terry LOISE Hurst MD [facility] Pager [phone]  If 7PM-7AM, please contact night-coverage [email or web address] Password [email or web address]  01/07/2024, 8:08 PM

## 2024-01-07 NOTE — Progress Notes (Signed)
 Patient admitted to room 320 from ED.  Patient complains of upper back and shoulder discomfort when he moves in a certain way. States he has no pain at rest. Patient is alert and oriented pleasant and cooperative.  Lives at home with his sister.

## 2024-01-08 ENCOUNTER — Inpatient Hospital Stay (HOSPITAL_COMMUNITY)
Admission: EM | Disposition: A | Discharge status: Home / Self Care | Payer: Self-pay | Source: Home / Self Care | Attending: Hospitalist

## 2024-01-08 ENCOUNTER — Observation Stay (HOSPITAL_COMMUNITY)

## 2024-01-08 DIAGNOSIS — I252 Old myocardial infarction: Secondary | ICD-10-CM | POA: Diagnosis not present

## 2024-01-08 DIAGNOSIS — Z9861 Coronary angioplasty status: Secondary | ICD-10-CM | POA: Diagnosis not present

## 2024-01-08 DIAGNOSIS — G4733 Obstructive sleep apnea (adult) (pediatric): Secondary | ICD-10-CM | POA: Diagnosis present

## 2024-01-08 DIAGNOSIS — T82855A Stenosis of coronary artery stent, initial encounter: Secondary | ICD-10-CM | POA: Diagnosis present

## 2024-01-08 DIAGNOSIS — E66812 Obesity, class 2: Secondary | ICD-10-CM | POA: Diagnosis present

## 2024-01-08 DIAGNOSIS — I440 Atrioventricular block, first degree: Secondary | ICD-10-CM | POA: Diagnosis present

## 2024-01-08 DIAGNOSIS — Z87891 Personal history of nicotine dependence: Secondary | ICD-10-CM | POA: Diagnosis not present

## 2024-01-08 DIAGNOSIS — Z79899 Other long term (current) drug therapy: Secondary | ICD-10-CM | POA: Diagnosis not present

## 2024-01-08 DIAGNOSIS — E118 Type 2 diabetes mellitus with unspecified complications: Secondary | ICD-10-CM

## 2024-01-08 DIAGNOSIS — Z743 Need for continuous supervision: Secondary | ICD-10-CM | POA: Diagnosis not present

## 2024-01-08 DIAGNOSIS — I25110 Atherosclerotic heart disease of native coronary artery with unstable angina pectoris: Principal | ICD-10-CM

## 2024-01-08 DIAGNOSIS — E785 Hyperlipidemia, unspecified: Secondary | ICD-10-CM | POA: Diagnosis not present

## 2024-01-08 DIAGNOSIS — Z6837 Body mass index (BMI) 37.0-37.9, adult: Secondary | ICD-10-CM | POA: Diagnosis not present

## 2024-01-08 DIAGNOSIS — I200 Unstable angina: Secondary | ICD-10-CM | POA: Diagnosis not present

## 2024-01-08 DIAGNOSIS — F410 Panic disorder [episodic paroxysmal anxiety] without agoraphobia: Secondary | ICD-10-CM | POA: Diagnosis present

## 2024-01-08 DIAGNOSIS — I259 Chronic ischemic heart disease, unspecified: Secondary | ICD-10-CM

## 2024-01-08 DIAGNOSIS — Z8249 Family history of ischemic heart disease and other diseases of the circulatory system: Secondary | ICD-10-CM | POA: Diagnosis not present

## 2024-01-08 DIAGNOSIS — Z7984 Long term (current) use of oral hypoglycemic drugs: Secondary | ICD-10-CM | POA: Diagnosis not present

## 2024-01-08 DIAGNOSIS — I959 Hypotension, unspecified: Secondary | ICD-10-CM | POA: Diagnosis not present

## 2024-01-08 DIAGNOSIS — Z7982 Long term (current) use of aspirin: Secondary | ICD-10-CM | POA: Diagnosis not present

## 2024-01-08 DIAGNOSIS — K219 Gastro-esophageal reflux disease without esophagitis: Secondary | ICD-10-CM | POA: Diagnosis present

## 2024-01-08 DIAGNOSIS — G4700 Insomnia, unspecified: Secondary | ICD-10-CM | POA: Diagnosis present

## 2024-01-08 DIAGNOSIS — M069 Rheumatoid arthritis, unspecified: Secondary | ICD-10-CM | POA: Diagnosis present

## 2024-01-08 DIAGNOSIS — F40240 Claustrophobia: Secondary | ICD-10-CM | POA: Diagnosis present

## 2024-01-08 DIAGNOSIS — I10 Essential (primary) hypertension: Secondary | ICD-10-CM | POA: Diagnosis present

## 2024-01-08 DIAGNOSIS — Z9049 Acquired absence of other specified parts of digestive tract: Secondary | ICD-10-CM | POA: Diagnosis not present

## 2024-01-08 DIAGNOSIS — E7800 Pure hypercholesterolemia, unspecified: Secondary | ICD-10-CM | POA: Diagnosis present

## 2024-01-08 DIAGNOSIS — F4310 Post-traumatic stress disorder, unspecified: Secondary | ICD-10-CM | POA: Diagnosis present

## 2024-01-08 DIAGNOSIS — Z7902 Long term (current) use of antithrombotics/antiplatelets: Secondary | ICD-10-CM | POA: Diagnosis not present

## 2024-01-08 DIAGNOSIS — R079 Chest pain, unspecified: Secondary | ICD-10-CM

## 2024-01-08 DIAGNOSIS — Y831 Surgical operation with implant of artificial internal device as the cause of abnormal reaction of the patient, or of later complication, without mention of misadventure at the time of the procedure: Secondary | ICD-10-CM | POA: Diagnosis present

## 2024-01-08 DIAGNOSIS — Z888 Allergy status to other drugs, medicaments and biological substances status: Secondary | ICD-10-CM | POA: Diagnosis not present

## 2024-01-08 DIAGNOSIS — E119 Type 2 diabetes mellitus without complications: Secondary | ICD-10-CM | POA: Diagnosis present

## 2024-01-08 DIAGNOSIS — I2510 Atherosclerotic heart disease of native coronary artery without angina pectoris: Secondary | ICD-10-CM | POA: Diagnosis not present

## 2024-01-08 HISTORY — PX: LEFT HEART CATH AND CORONARY ANGIOGRAPHY: CATH118249

## 2024-01-08 HISTORY — PX: CORONARY BALLOON ANGIOPLASTY: CATH118233

## 2024-01-08 LAB — MAGNESIUM: Magnesium: 2.2 mg/dL (ref 1.7–2.4)

## 2024-01-08 LAB — BASIC METABOLIC PANEL WITH GFR
Anion gap: 10 (ref 5–15)
BUN: 15 mg/dL (ref 8–23)
CO2: 28 mmol/L (ref 22–32)
Calcium: 9.1 mg/dL (ref 8.9–10.3)
Chloride: 103 mmol/L (ref 98–111)
Creatinine, Ser: 0.88 mg/dL (ref 0.61–1.24)
GFR, Estimated: >60 mL/min (ref >60)
Glucose, Bld: 114 mg/dL — ABNORMAL HIGH (ref 70–99)
Potassium: 4.3 mmol/L (ref 3.5–5.1)
Sodium: 141 mmol/L (ref 135–145)

## 2024-01-08 LAB — CBC
HCT: 41.6 % (ref 39.0–52.0)
Hemoglobin: 14.2 g/dL (ref 13.0–17.0)
MCH: 31.3 pg (ref 26.0–34.0)
MCHC: 34.1 g/dL (ref 30.0–36.0)
MCV: 91.6 fL (ref 80.0–100.0)
Platelets: 256 K/uL (ref 150–400)
RBC: 4.54 MIL/uL (ref 4.22–5.81)
RDW: 13 % (ref 11.5–15.5)
WBC: 9.4 K/uL (ref 4.0–10.5)
nRBC: 0 % (ref 0.0–0.2)

## 2024-01-08 LAB — ECHOCARDIOGRAM COMPLETE
Area-P 1/2: 3.37 cm2
Calc EF: 56 %
Height: 66 in
S' Lateral: 3.8 cm
Single Plane A2C EF: 55.3 %
Single Plane A4C EF: 56.5 %
Weight: 3668.45 [oz_av]

## 2024-01-08 LAB — POCT ACTIVATED CLOTTING TIME
Activated Clotting Time: 348 s
Activated Clotting Time: 510 s
Activated Clotting Time: 245 s
Activated Clotting Time: 204 s
Activated Clotting Time: 164 s

## 2024-01-08 LAB — HEMOGLOBIN A1C
Hgb A1c MFr Bld: 7.4 % — ABNORMAL HIGH (ref 4.8–5.6)
Mean Plasma Glucose: 165.68 mg/dL

## 2024-01-08 LAB — GLUCOSE, CAPILLARY
Glucose-Capillary: 105 mg/dL — ABNORMAL HIGH (ref 70–99)
Glucose-Capillary: 111 mg/dL — ABNORMAL HIGH (ref 70–99)
Glucose-Capillary: 115 mg/dL — ABNORMAL HIGH (ref 70–99)

## 2024-01-08 LAB — PHOSPHORUS: Phosphorus: 3.1 mg/dL (ref 2.5–4.6)

## 2024-01-08 LAB — HIV ANTIBODY (ROUTINE TESTING W REFLEX): HIV Screen 4th Generation wRfx: NONREACTIVE

## 2024-01-08 MED ORDER — LIDOCAINE HCL (PF) 1 % IJ SOLN
INTRAMUSCULAR | Status: AC
  Filled 2024-01-08: qty 30

## 2024-01-08 MED ORDER — VERAPAMIL HCL 2.5 MG/ML IV SOLN
INTRAVENOUS | Status: AC
  Filled 2024-01-08: qty 2

## 2024-01-08 MED ORDER — HEPARIN SODIUM (PORCINE) 1000 UNIT/ML IJ SOLN
INTRAMUSCULAR | Status: AC
Start: 2024-01-08 — End: 2024-01-08
  Filled 2024-01-08: qty 10

## 2024-01-08 MED ORDER — HEPARIN (PORCINE) 25000 UT/250ML-% IV SOLN
1200.0000 [IU]/h | INTRAVENOUS | Status: DC
  Administered 2024-01-08 – 2024-01-09 (×2): 1200 [IU]/h via INTRAVENOUS
  Filled 2024-01-08: qty 250

## 2024-01-08 MED ORDER — HEPARIN (PORCINE) IN NACL 1000-0.9 UT/500ML-% IV SOLN
INTRAVENOUS | Status: DC | PRN
  Administered 2024-01-08 (×3): 500 mL

## 2024-01-08 MED ORDER — ACETAMINOPHEN 500 MG PO TABS
ORAL_TABLET | ORAL | Status: AC
  Filled 2024-01-08: qty 1

## 2024-01-08 MED ORDER — ASPIRIN 81 MG PO CHEW
81.0000 mg | CHEWABLE_TABLET | Freq: Every day | ORAL | Status: DC
  Administered 2024-01-08 – 2024-01-10 (×3): 81 mg via ORAL
  Filled 2024-01-08 (×3): qty 1

## 2024-01-08 MED ORDER — HEPARIN SODIUM (PORCINE) 1000 UNIT/ML IJ SOLN
INTRAMUSCULAR | Status: DC | PRN
  Administered 2024-01-08: 11000 [IU] via INTRAVENOUS

## 2024-01-08 MED ORDER — NITROGLYCERIN 0.1 MG/HR TD PT24: 0.1000 mg | MEDICATED_PATCH | Freq: Every day | TRANSDERMAL | Status: DC | PRN

## 2024-01-08 MED ORDER — NITROGLYCERIN 1 MG/10 ML FOR IR/CATH LAB
INTRA_ARTERIAL | Status: AC
  Filled 2024-01-08: qty 10

## 2024-01-08 MED ORDER — LABETALOL HCL 5 MG/ML IV SOLN
10.0000 mg | INTRAVENOUS | Status: DC | PRN
Start: 2024-01-08 — End: 2024-01-09

## 2024-01-08 MED ORDER — SODIUM CHLORIDE 0.9% FLUSH: 3.0000 mL | INTRAVENOUS | Status: DC | PRN

## 2024-01-08 MED ORDER — HEPARIN (PORCINE) 25000 UT/250ML-% IV SOLN
1200.0000 [IU]/h | INTRAVENOUS | Status: DC
  Administered 2024-01-08: 1200 [IU]/h via INTRAVENOUS
  Filled 2024-01-08: qty 250

## 2024-01-08 MED ORDER — OMEGA-3-ACID ETHYL ESTERS 1 G PO CAPS
2.0000 g | ORAL_CAPSULE | Freq: Every day | ORAL | Status: DC
  Administered 2024-01-08 – 2024-01-10 (×3): 2 g via ORAL
  Filled 2024-01-08 (×3): qty 2

## 2024-01-08 MED ORDER — FENTANYL CITRATE (PF) 100 MCG/2ML IJ SOLN
INTRAMUSCULAR | Status: AC
  Filled 2024-01-08: qty 2

## 2024-01-08 MED ORDER — HEPARIN SODIUM (PORCINE) 1000 UNIT/ML IJ SOLN
INTRAMUSCULAR | Status: AC
  Filled 2024-01-08: qty 10

## 2024-01-08 MED ORDER — FREE WATER
500.0000 mL | Freq: Once | Status: AC
  Administered 2024-01-08: 500 mL via ORAL

## 2024-01-08 MED ORDER — HEPARIN BOLUS VIA INFUSION
4000.0000 [IU] | Freq: Once | INTRAVENOUS | Status: AC
  Administered 2024-01-08: 4000 [IU] via INTRAVENOUS
  Filled 2024-01-08: qty 4000

## 2024-01-08 MED ORDER — MORPHINE SULFATE (PF) 2 MG/ML IV SOLN
2.0000 mg | INTRAVENOUS | Status: DC | PRN
  Administered 2024-01-09: 2 mg via INTRAVENOUS
  Filled 2024-01-08: qty 1

## 2024-01-08 MED ORDER — OXYCODONE HCL 5 MG PO TABS
5.0000 mg | ORAL_TABLET | Freq: Four times a day (QID) | ORAL | Status: DC | PRN
  Administered 2024-01-09: 10 mg via ORAL
  Filled 2024-01-08: qty 2

## 2024-01-08 MED ORDER — SODIUM CHLORIDE 0.9% FLUSH
3.0000 mL | Freq: Two times a day (BID) | INTRAVENOUS | Status: DC
  Administered 2024-01-08 – 2024-01-09 (×2): 3 mL via INTRAVENOUS

## 2024-01-08 MED ORDER — AMLODIPINE BESYLATE 5 MG PO TABS: 5.0000 mg | ORAL_TABLET | Freq: Every day | ORAL | Status: DC

## 2024-01-08 MED ORDER — FENTANYL CITRATE (PF) 100 MCG/2ML IJ SOLN
INTRAMUSCULAR | Status: DC | PRN
  Administered 2024-01-08 (×2): 25 ug via INTRAVENOUS

## 2024-01-08 MED ORDER — INSULIN ASPART 100 UNIT/ML IJ SOLN: 0.0000 [IU] | Freq: Every day | INTRAMUSCULAR | Status: DC

## 2024-01-08 MED ORDER — HYDROXYZINE HCL 25 MG PO TABS
25.0000 mg | ORAL_TABLET | Freq: Three times a day (TID) | ORAL | Status: DC | PRN
  Administered 2024-01-09: 25 mg via ORAL
  Filled 2024-01-08: qty 1

## 2024-01-08 MED ORDER — ISOSORBIDE MONONITRATE ER 30 MG PO TB24
30.0000 mg | ORAL_TABLET | Freq: Every day | ORAL | Status: DC
  Administered 2024-01-08 – 2024-01-10 (×3): 30 mg via ORAL
  Filled 2024-01-08 (×3): qty 1

## 2024-01-08 MED ORDER — CLOPIDOGREL BISULFATE 75 MG PO TABS
75.0000 mg | ORAL_TABLET | Freq: Every day | ORAL | Status: DC
  Administered 2024-01-08 – 2024-01-10 (×3): 75 mg via ORAL
  Filled 2024-01-08 (×3): qty 1

## 2024-01-08 MED ORDER — MIDAZOLAM HCL (PF) 2 MG/2ML IJ SOLN
INTRAMUSCULAR | Status: DC | PRN
  Administered 2024-01-08 (×2): 1 mg via INTRAVENOUS

## 2024-01-08 MED ORDER — ENOXAPARIN SODIUM 60 MG/0.6ML IJ SOSY: 50.0000 mg | PREFILLED_SYRINGE | INTRAMUSCULAR | Status: DC

## 2024-01-08 MED ORDER — PANTOPRAZOLE SODIUM 40 MG PO TBEC
40.0000 mg | DELAYED_RELEASE_TABLET | Freq: Every day | ORAL | Status: DC
  Administered 2024-01-08 – 2024-01-10 (×3): 40 mg via ORAL
  Filled 2024-01-08 (×3): qty 1

## 2024-01-08 MED ORDER — ACETAMINOPHEN 325 MG PO TABS
ORAL_TABLET | ORAL | Status: AC
  Administered 2024-01-08: 500 mg
  Filled 2024-01-08: qty 2

## 2024-01-08 MED ORDER — LIDOCAINE HCL (PF) 1 % IJ SOLN
INTRAMUSCULAR | Status: DC | PRN
  Administered 2024-01-08: 2 mL

## 2024-01-08 MED ORDER — INSULIN ASPART 100 UNIT/ML IJ SOLN
0.0000 [IU] | Freq: Three times a day (TID) | INTRAMUSCULAR | Status: DC
  Administered 2024-01-10 (×2): 1 [IU] via SUBCUTANEOUS
  Filled 2024-01-08 (×2): qty 1

## 2024-01-08 MED ORDER — SODIUM CHLORIDE 0.9 % IV SOLN: 250.0000 mL | INTRAVENOUS | Status: DC | PRN

## 2024-01-08 MED ORDER — FREE WATER: 500.0000 mL | Freq: Once | Status: DC

## 2024-01-08 MED ORDER — MIDAZOLAM HCL 2 MG/2ML IJ SOLN
INTRAMUSCULAR | Status: AC
  Filled 2024-01-08: qty 2

## 2024-01-08 MED ORDER — LACTATED RINGERS IV SOLN: INTRAVENOUS | Status: AC

## 2024-01-08 MED ORDER — IOHEXOL 350 MG/ML SOLN
INTRAVENOUS | Status: DC | PRN
  Administered 2024-01-08: 65 mL

## 2024-01-08 NOTE — Progress Notes (Signed)
 PHARMACY - ANTICOAGULATION CONSULT NOTE  Pharmacy Consult for heparin  dosing Indication: Chest pain/ACS  Allergies  Allergen Reactions   Ativan [Lorazepam] Other (See Comments)    Hyperactivity    Benazepril Cough    Patient Measurements: Height: 5' 6 (167.6 cm) Weight: 104.8 kg (231 lb 0.7 oz) IBW/kg (Calculated) : 63.8 HEPARIN  DW (KG): 87.1  Vital Signs: Temp: 98.2 F (36.8 C) (11/11 2146) Temp Source: Oral (11/11 2146) BP: 118/71 (11/11 2146) Pulse Rate: 63 (11/11 2146)  Labs: Recent Labs    01/07/24 1820 01/08/24 0414  HGB 15.3 14.2  HCT 45.0 41.6  PLT 279 256  CREATININE 0.90 0.88    Estimated Creatinine Clearance: 100 mL/min (by C-G formula based on SCr of 0.88 mg/dL).   Medical History: Past Medical History:  Diagnosis Date   Coronary artery disease    Diabetes mellitus type 2, noninsulin dependent (HCC)    GERD (gastroesophageal reflux disease)    High cholesterol    Hypertension    under control with med., has been on med. x 10 yr.   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   left shoulder, states unable to raise left arm   Myocardial infarction [facility])    Panic attacks    Rheumatoid arthritis (HCC)    Sleep apnea    Tenosynovitis of wrist 03/2013   right   Wears glasses     Medications:  Medications Prior to Admission  Medication Sig Dispense Refill Last Dose/Taking   amLODipine (NORVASC) 5 MG tablet Take 5 mg by mouth daily.   01/07/2024 at  8:00 AM   aspirin  81 MG chewable tablet Chew 81 mg by mouth daily.   01/07/2024 at  8:00 AM   atorvastatin (LIPITOR) 40 MG tablet Take 40 mg by mouth at bedtime.   01/06/2024 at  9:00 PM   bismuth subsalicylate (PEPTO BISMOL) 262 MG/15ML suspension Take 30 mLs by mouth every 6 (six) hours as needed for indigestion or diarrhea or loose stools.   01/05/2024   clopidogrel  (PLAVIX ) 75 MG tablet TAKE 1 TABLET EVERY DAY WITH BREAKFAST 90 tablet 3 01/07/2024 at  8:00 AM   Dulaglutide (TRULICITY) 1.5 MG/0.5ML  SOAJ Inject 1.5 mg into the skin every Monday.   01/07/2024 Morning   empagliflozin (JARDIANCE) 25 MG TABS tablet Take 12.5 mg by mouth daily.   01/07/2024 at  8:00 AM   escitalopram (LEXAPRO) 10 MG tablet Take 10 mg by mouth daily.   01/07/2024 at  8:00 AM   ezetimibe  (ZETIA ) 10 MG tablet TAKE 1 TABLET EVERY DAY 90 tablet 3 01/07/2024 at  8:00 AM   hydrOXYzine (ATARAX) 25 MG tablet Take 25 mg by mouth 3 (three) times daily as needed for anxiety.   01/07/2024 at  8:00 AM   isosorbide mononitrate (IMDUR) 30 MG 24 hr tablet Take 30 mg by mouth daily.   01/07/2024 at  8:00 AM   losartan (COZAAR) 100 MG tablet Take 100 mg by mouth daily.   01/07/2024 at  8:00 AM   metFORMIN (GLUCOPHAGE-XR) 500 MG 24 hr tablet Take 500 mg by mouth in the morning and at bedtime.   01/07/2024 at  8:00 AM   metoprolol  succinate (TOPROL -XL) 25 MG 24 hr tablet TAKE 1 TABLET EVERY DAY 90 tablet 3 01/07/2024 at  8:00 AM   Mometasone Furoate (ASMANEX HFA) 200 MCG/ACT AERO Inhale 2 puffs into the lungs daily.   01/07/2024 at  8:00 AM   Multiple Vitamin (ONE-A-DAY MENS PO) Take 1 tablet  by mouth daily.    01/07/2024 at  8:00 AM   nitroGLYCERIN  (NITRODUR - DOSED IN MG/24 HR) 0.1 mg/hr patch Place 0.1 mg onto the skin daily as needed (chest pain).   01/06/2024 Morning   Omega-3 Fatty Acids (FISH OIL) 1000 MG CAPS Take 1,000 mg by mouth 2 (two) times daily.   01/07/2024 at  8:00 AM   pantoprazole  (PROTONIX ) 40 MG tablet Take 40 mg by mouth daily.   01/07/2024 at  8:00 AM   traZODone (DESYREL) 100 MG tablet Take 100 mg by mouth at bedtime.   01/06/2024 at  9:00 PM    Assessment: 63 YO M with PMH of CAD status post PCI with stent replacement on DAPT (aspirin , Plavix ), HTN, HLD, T2DM, and OSA. Presenting with chest pain. Received Lovenox 52.5 mg (0.5mg /kg treatment dose) last night (11/10, 2200). Plan to transfer to [facility] for left/right heart catherization today (11/11)  11/11 PM: post cath restarting heparin  infusion at previous  rate of 1200 units/hr.  Goal of Therapy:  Heparin  level 0.3-0.7 units/ml Monitor platelets by anticoagulation protocol: Yes   Plan:  Restart heparin  infusion at 1200 units/hr Check anti-Xa level in 6 hours and daily while on heparin  Continue to monitor H&H and platelets  Larraine Brazier, PharmD Clinical Pharmacist 01/08/2024  10:42 PM **Pharmacist phone directory can now be found on [email or web address] ([email or web address]).  Listed under [facility].

## 2024-01-08 NOTE — Plan of Care (Signed)

## 2024-01-08 NOTE — TOC CM/SW Note (Signed)
 Transition of Care [facility]) - Inpatient Brief Assessment   Patient Details  Name: Greg Alvarez MRN: [id number] Date of Birth: 1960/02/28  Transition of Care [facility]) CM/SW Contact:    Lucie Lunger, LCSWA Phone Number: 01/08/2024, 9:55 AM   Clinical Narrative: Transition of Care Department [facility]) has reviewed patient and no TOC needs have been identified at this time. We will continue to monitor patient advancement through interdiciplinary progression rounds. If new patient transition needs arise, please place a TOC consult.  Transition of Care Asessment: Insurance and Status: Insurance coverage has been reviewed Patient has primary care physician: Yes Home environment has been reviewed: From home Prior level of function:: Independent Prior/Current Home Services: No current home services Social Drivers of Health Review: SDOH reviewed no interventions necessary Readmission risk has been reviewed: Yes Transition of care needs: no transition of care needs at this time

## 2024-01-08 NOTE — Progress Notes (Signed)
 Site area: Right groin Site Prior to Removal:  Level 0 Pressure Applied For:40 minutes Manual:  Yes  Patient Status During Pull:  Stable Post Pull Site:  Level 0 Post Pull Instructions Given:  Yes Post Pull Pulses Present:  Dressing Applied:  Gauze and tegaderm Bedrest begins @  2100 Comments: small hematoma formed at surgical incision site. manual pressure held for an additional 10 minutes until site was level zero.   Regarding patient's right radial incision site, pt arrived into recovery room with scant bleeding on dressing. Cath lab staff stated a small hematoma had formed in the cath lab near the end of the procedure. At 2105 small hematoma re- formed at incision site. Manual pressure held for 15 minutes until site was level zero. No bleeding noted. Right hand and fingers pink, warm and dry with capillary refill less than 2 seconds. Radial site was dopplered and found to have strong audible pulse. Site was cleaned and re- dressed.

## 2024-01-08 NOTE — CV Procedure (Signed)
 Severe mid RCA ISR, likely due to severe calcification. Unable to pass any balloons. Of note, I could pass a teleport catheter and Potentially, we could perform atherectomy tomorrow during the day in more stable setting.  Newman JINNY Lawrence, MD

## 2024-01-08 NOTE — Consult Note (Signed)
 Cardiology Consultation   Patient ID: Greg Alvarez MRN: [id number]; DOB: [birth year]  Admit date: 01/07/2024 Date of Consult: 01/08/2024  PCP:  Dasie Perkins, PA   [facility] Providers Cardiologist:  Peter Jordan, MD   {  Patient Profile:  Greg Alvarez is a 63 y.o. male with a hx of CAD (s/p DES to LAD in 08/2016, cath in 01/2023 with patent LAD stent, stable disease along LCx and overall small in caliber and 95% stenosis along RCA with DESx1 and unsuccessful PCI of the PL branch as the vessel was occluded unable to be crossed with a wire with recommendations to consider repeat PCI of the PDA with atherectomy if refractory angina), HTN, HLD, Type 2 DM and OSA who is being seen 01/08/2024 for the evaluation of chest pain at the request of Dr. Willette.  History of Present Illness:  Greg Alvarez was last examined by Dr. Jordan in 08/2023 and denied any recent anginal symptoms. Was going to the gym several days a week and denied any symptoms with this. No changes were made to his cardiac medications and he was continued on Amlodipine 5 mg daily, ASA 81 mg daily, Atorvastatin 40 mg daily, Plavix  75 mg daily, Jardiance 12.5 mg daily, Imdur 30 mg daily and Losartan 100 mg daily.  He presented to [facility] yesterday evening for evaluation of chest pain. In talking with the patient today, he reports having baseline dyspnea on exertion and fatigue with no acute changes in this. Starting last Friday, he reports having episodes of left-sided chest pressure along his pectoral region which would radiate into his back. Symptoms would occur at rest or with activity. Episodes initially lasted for several minutes and then resolved but he reports having more constant chest pain throughout the day yesterday which prompted him to seek evaluation. Pain is not associated with positional changes or food consumption. Says his prior anginal symptoms were shortness of breath but he does report  having been diagnosed with angina in the past and feels like this resembles that. He has been compliant with his medications and has not missed any recent doses. He denies any orthopnea, PND or pitting edema. He does have known OSA but intolerant to CPAP due to claustrophobia and PTSD.  Initial labs showed WBC 9.5, Hgb 15.3, platelets 279, Na+ 141, K+ 4.3 and creatinine 0.90. Initial and repeat Troponin T negative at < 15. CXR with no acute abnormalities. EKG shows NSR, HR 61 with 1st degree AV block and inferior infarct pattern with slight TWI along V5 and V6.   Past Medical History:  Diagnosis Date   Coronary artery disease    Diabetes mellitus type 2, noninsulin dependent (HCC)    GERD (gastroesophageal reflux disease)    High cholesterol    Hypertension    under control with med., has been on med. x 10 yr.   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   left shoulder, states unable to raise left arm   Myocardial infarction [facility])    Panic attacks    Rheumatoid arthritis (HCC)    Sleep apnea    Tenosynovitis of wrist 03/2013   right   Wears glasses     Past Surgical History:  Procedure Laterality Date   APPENDECTOMY  02/27/1978   BIOPSY  04/11/2018   Procedure: BIOPSY;  Surgeon: Shaaron Lamar HERO, MD;  [facility];  Service: Endoscopy;;  esophagus   BIOPSY  07/21/2021   Procedure: BIOPSY;  Surgeon: Shaaron Lamar  M, MD;  [facility];  Service: Endoscopy;;   CARDIAC CATHETERIZATION  02/28/2000   1 stent placed in LAD   CARPAL TUNNEL RELEASE Left 07/29/2013   Procedure: LEFT CARPAL TUNNEL RELEASE;  Surgeon: Arley JONELLE Curia, MD;  Location: [facility];  Service: Orthopedics;  Laterality: Left;   COLONOSCOPY N/A 01/12/2014   Procedure: COLONOSCOPY;  Surgeon: Lamar CHRISTELLA Hollingshead, MD;  [facility];  Service: Endoscopy;  Laterality: N/A;  9:30 AM   COLONOSCOPY WITH PROPOFOL  N/A 07/21/2021   Procedure: COLONOSCOPY WITH PROPOFOL ;  Surgeon: Hollingshead Lamar CHRISTELLA, MD;  [facility];  Service: Endoscopy;  Laterality: N/A;  10:30am   CORONARY LITHOTRIPSY N/A 02/13/2023   Procedure: CORONARY LITHOTRIPSY;  Surgeon: Jordan, Peter M, MD;  Location: [facility] INVASIVE CV LAB;  Service: Cardiovascular;  Laterality: N/A;   CORONARY STENT INTERVENTION N/A 02/13/2023   Procedure: CORONARY STENT INTERVENTION;  Surgeon: Jordan, Peter M, MD;  Location: [facility] INVASIVE CV LAB;  Service: Cardiovascular;  Laterality: N/A;   ESOPHAGOGASTRODUODENOSCOPY (EGD) WITH PROPOFOL  N/A 04/11/2018   Procedure: ESOPHAGOGASTRODUODENOSCOPY (EGD) WITH PROPOFOL ;  Surgeon: Hollingshead Lamar CHRISTELLA, MD;  [facility];  Service: Endoscopy;  Laterality: N/A;  1:15pm   ESOPHAGOGASTRODUODENOSCOPY (EGD) WITH PROPOFOL  N/A 07/21/2021   Procedure: ESOPHAGOGASTRODUODENOSCOPY (EGD) WITH PROPOFOL ;  Surgeon: Hollingshead Lamar CHRISTELLA, MD;  [facility];  Service: Endoscopy;  Laterality: N/A;   LEFT HEART CATH AND CORONARY ANGIOGRAPHY N/A 08/09/2021   Procedure: LEFT HEART CATH AND CORONARY ANGIOGRAPHY;  Surgeon: Claudene Victory ORN, MD;  Location: [facility] INVASIVE CV LAB;  Service: Cardiovascular;  Laterality: N/A;   LEFT HEART CATH AND CORONARY ANGIOGRAPHY N/A 02/13/2023   Procedure: LEFT HEART CATH AND CORONARY ANGIOGRAPHY;  Surgeon: Jordan, Peter M, MD;  Location: [facility] INVASIVE CV LAB;  Service: Cardiovascular;  Laterality: N/A;   SHOULDER ARTHROSCOPY W/ ROTATOR CUFF REPAIR  05/28/2013   left-DSC   TENDON REPAIR Right    hand   TENOSYNOVECTOMY Right 04/08/2013   Procedure: TENOSYNOVECTOMY/FLEXOR TENDONS RIGHT WRIST;  Surgeon: Arley JONELLE Curia, MD;  Location: [facility];  Service: Orthopedics;  Laterality: Right;     Home Medications:  Prior to Admission medications   Medication Sig Start Date End Date Taking? Authorizing Provider  amLODipine (NORVASC) 5 MG tablet Take 5 mg by mouth daily. 10/30/19  Yes [provider]  aspirin  81 MG chewable tablet Chew 81 mg by mouth daily. 04/25/14   Yes [provider]  atorvastatin (LIPITOR) 40 MG tablet Take 40 mg by mouth at bedtime.   Yes [provider]  bismuth subsalicylate (PEPTO BISMOL) 262 MG/15ML suspension Take 30 mLs by mouth every 6 (six) hours as needed for indigestion or diarrhea or loose stools.   Yes [provider]  clopidogrel  (PLAVIX ) 75 MG tablet TAKE 1 TABLET EVERY DAY WITH BREAKFAST 07/12/23  Yes Jordan, Peter M, MD  Dulaglutide (TRULICITY) 1.5 MG/0.5ML SOAJ Inject 1.5 mg into the skin every Monday.   Yes [provider]  empagliflozin (JARDIANCE) 25 MG TABS tablet Take 12.5 mg by mouth daily. 05/15/22  Yes [provider]  escitalopram (LEXAPRO) 10 MG tablet Take 10 mg by mouth daily. 08/24/22  Yes [provider]  ezetimibe  (ZETIA ) 10 MG tablet TAKE 1 TABLET EVERY DAY 12/24/23  Yes Jordan, Peter M, MD  hydrOXYzine (ATARAX) 25 MG tablet Take 25 mg by mouth 3 (three) times daily as needed for anxiety. 04/05/21  Yes [provider]  isosorbide mononitrate (IMDUR) 30 MG 24 hr tablet Take 30 mg by mouth daily.   Yes [provider]  losartan (COZAAR) 100 MG tablet Take 100 mg by mouth daily.   Yes [provider]  metFORMIN (GLUCOPHAGE-XR) 500 MG 24 hr tablet Take 500 mg by mouth in the morning and at bedtime.   Yes [provider]  metoprolol  succinate (TOPROL -XL) 25 MG 24 hr tablet TAKE 1 TABLET EVERY DAY 09/01/22  Yes Jordan, Peter M, MD  Mometasone Furoate [facility] HFA) 200 MCG/ACT AERO Inhale 2 puffs into the lungs daily.   Yes [provider]  Multiple Vitamin (ONE-A-DAY MENS PO) Take 1 tablet by mouth daily.    Yes [provider]  nitroGLYCERIN  (NITRODUR - DOSED IN MG/24 HR) 0.1 mg/hr patch Place 0.1 mg onto the skin daily as needed (chest pain).   Yes [provider]  Omega-3 Fatty Acids (FISH OIL) 1000 MG CAPS Take 1,000 mg by mouth 2 (two) times daily.   Yes [provider]  pantoprazole   (PROTONIX ) 40 MG tablet Take 40 mg by mouth daily. 10/30/19  Yes [provider]  traZODone (DESYREL) 100 MG tablet Take 100 mg by mouth at bedtime.   Yes [provider]    Scheduled Meds:  aspirin   81 mg Oral Daily   atorvastatin  40 mg Oral QHS   clopidogrel   75 mg Oral Daily   enoxaparin (LOVENOX) injection  0.5 mg/kg Subcutaneous Q24H   ezetimibe   10 mg Oral Daily   insulin aspart  0-5 Units Subcutaneous QHS   insulin aspart  0-9 Units Subcutaneous TID WC   omega-3 acid ethyl esters  2 g Oral Daily   pantoprazole   40 mg Oral Daily   traZODone  100 mg Oral QHS   Continuous Infusions:  lactated ringers  50 mL/hr at 01/08/24 0453   PRN Meds: acetaminophen , hydrOXYzine, melatonin, morphine injection, nitroGLYCERIN , oxyCODONE , polyethylene glycol, prochlorperazine  Allergies:    Allergies  Allergen Reactions   Ativan [Lorazepam] Other (See Comments)    Hyperactivity    Benazepril Cough    Social History:   Social History   Socioeconomic History   Marital status: Divorced    Spouse name: Not on file   Number of children: Not on file   Years of education: Not on file   Highest education level: Not on file  Occupational History   Not on file  Tobacco Use   Smoking status: Former    Current packs/day: 0.00    Types: Cigarettes    Quit date: 03/13/1998    Years since quitting: 25.8   Smokeless tobacco: Never  Vaping Use   Vaping status: Never Used  Substance and Sexual Activity   Alcohol use: Not Currently    Comment: once or twice a year   Drug use: No   Sexual activity: Yes    Partners: Female  Other Topics Concern   Not on file  Social History Narrative   Not on file    Family History:    Family History  Problem Relation Age of Onset   Anesthesia problems Sister        hard to wake up post-op   Parkinson's disease Sister    Multiple sclerosis Sister    Epilepsy Mother    Heart disease Father    Parkinson's disease Father    Heart  failure Father    Heart attack Father    Heart disease Brother    Heart attack Brother  Stroke Brother    Colon cancer Neg Hx    Gastric cancer Neg Hx    Esophageal cancer Neg Hx      ROS:  Please see the history of present illness.   All other ROS reviewed and negative.     Physical Exam/Data: Vitals:   01/07/24 1930 01/07/24 2004 01/07/24 2353 01/08/24 0418  BP: 128/81 128/84 106/66 113/69  Pulse: (!) 58 (!) 58 (!) 58 (!) 55  Resp: 20 18 16 16   Temp:  97.8 F (36.6 C) 98 F (36.7 C) 98.2 F (36.8 C)  TempSrc:  Oral Oral Oral  SpO2: 90% 96% 93% 92%  Weight:  104 kg    Height:        Intake/Output Summary (Last 24 hours) at 01/08/2024 0751 Last data filed at 01/08/2024 0453 Gross per 24 hour  Intake 454.18 ml  Output --  Net 454.18 ml      01/07/2024    8:04 PM 01/07/2024    6:09 PM 12/04/2023    8:31 PM  Last 3 Weights  Weight (lbs) 229 lb 4.5 oz 230 lb 230 lb  Weight (kg) 104 kg 104.327 kg 104.327 kg     Body mass index is 37.01 kg/m.  General:  Well nourished, well developed male appearing in no acute distress HEENT: normal Neck: no JVD Vascular: No carotid bruits; Distal pulses 2+ bilaterally Cardiac:  normal S1, S2; RRR; no murmur  Lungs:  clear to auscultation bilaterally, no wheezing, rhonchi or rales  Abd: soft, nontender, no hepatomegaly  Ext: no pitting edema Musculoskeletal:  No deformities, BUE and BLE strength normal and equal Skin: warm and dry  Neuro:  CNs 2-12 intact, no focal abnormalities noted Psych:  Normal affect   EKG:  The EKG was personally reviewed and demonstrates: NSR, HR 61 with 1st degree AV block and inferior infarct pattern with slight TWI along V5 and V6.   Telemetry:  Telemetry was personally reviewed and demonstrates: Sinus bradycardia, HR in 40's to 50's.   Relevant CV Studies:  Cardiac Catheterization: 02/13/2023   Mid LAD lesion is 25% stenosed.   1st Mrg lesion is 70% stenosed.   Prox RCA lesion is 95%  stenosed.   RPAV lesion is 100% stenosed.   RPDA lesion is 80% stenosed.   Prox Cx lesion is 80% stenosed.   A drug-eluting stent was successfully placed using a SYNERGY XD 4.0X20.   Balloon angioplasty was performed using a BALLN [place] EMERGE MR 2.5X12.   Post intervention, there is a 0% residual stenosis.   Post intervention, there is a 70% residual stenosis.   Post intervention, there is a 0% residual stenosis.   The left ventricular systolic function is normal.   LV end diastolic pressure is normal.   The left ventricular ejection fraction is 55-65% by visual estimate.   Recommend uninterrupted dual antiplatelet therapy with Aspirin  81mg  daily and Clopidogrel  75mg  daily for a minimum of 12 months (ACS-Class I recommendation).   2 vessel obstructive CAD. Continued patency of stent in the LAD. Disease in LCx is unchanged from prior angiogram and these branches are small in caliber. New high grade stenosis in the proximal to mid RCA. New occlusion of PL branch with left to right collaterals. Moderate obstructive disease in the PDA Good LV function with inferobasal HK. EF 55-60% Normal LVEDP Successful PCI and stenting of the proximal to mid RCA with DES Unsuccessful PCI of the PDA. Lesion treated with balloon angioplasty and Shockwave lithotripsy  with rigid unexpanding lesion Unsuccessful PCI of the PL branch of the RCA. This vessel is occluded and unable to cross with a wire. It is collateralized.    Plan: DAPT for one year. Optimize medical therapy. I think he will note significant improvement in his symptoms. If he has refractory limiting angina could consider repeat PCI of the PDA with atherectomy  Laboratory Data: High Sensitivity Troponin:  No results for input(s): TROPONINIHS in the last 720 hours.   Chemistry Recent Labs  Lab 01/07/24 1820 01/08/24 0414  NA 141 141  K 4.3 4.3  CL 102 103  CO2 27 28  GLUCOSE 114* 114*  BUN 16 15  CREATININE 0.90 0.88  CALCIUM 9.8 9.1  MG   --  2.2  GFRNONAA >60 >60  ANIONGAP 12 10    No results for input(s): PROT, ALBUMIN, AST, ALT, ALKPHOS, BILITOT in the last 168 hours. Lipids No results for input(s): CHOL, TRIG, HDL, LABVLDL, LDLCALC, CHOLHDL in the last 168 hours.  Hematology Recent Labs  Lab 01/07/24 1820 01/08/24 0414  WBC 9.5 9.4  RBC 4.90 4.54  HGB 15.3 14.2  HCT 45.0 41.6  MCV 91.8 91.6  MCH 31.2 31.3  MCHC 34.0 34.1  RDW 13.0 13.0  PLT 279 256   Thyroid No results for input(s): TSH, FREET4 in the last 168 hours.  BNPNo results for input(s): BNP, PROBNP in the last 168 hours.  DDimer No results for input(s): DDIMER in the last 168 hours.  Radiology/Studies:  DG Chest Port 1 View Result Date: 01/07/2024 CLINICAL DATA:  Chest pain EXAM: PORTABLE CHEST 1 VIEW COMPARISON:  Chest x-ray 12/04/2023 FINDINGS: The heart size and mediastinal contours are within normal limits. Both lungs are clear. The visualized skeletal structures are unremarkable. IMPRESSION: No active disease. Electronically Signed   By: Greig Pique M.D.   On: 01/07/2024 19:00     Assessment and Plan:  1. Chest Pain with Mixed Features/CAD - He has a history of complex CAD as outlined above with DES to the LAD in 2018 and cardiac catheterization in 01/2023 with DES to the RCA and unsuccessful PCI of the PL branch and residual LCx disease.   - Presents with episodic chest pain over the past 5 days which can occur at rest or with activity. Troponin values have been negative and EKG shows old inferior infarct pattern but he does have T wave inversion along V5 and V6.   - Will review with Dr. Mallipeddi but anticipate a cardiac catheterization for definitive evaluation given his complex CAD history and continued chest pain at this time. He did have breakfast this morning and would therefore not be a candidate for an NST today. Suspect this would also be abnormal given his underlying residual disease. Echocardiogram  was performed this morning with results pending.   - Continue ASA 81 mg daily, Plavix  75 mg daily, Atorvastatin 40 mg daily, Imdur 30 mg daily, Amlodipine 5 mg daily. Will hold Toprol -XL for now given heart rate in the 40's to 50's.    2. HTN - BP is well-controlled, at 113/69 on most recent check. He has been continued on Imdur 30 mg daily and Amlodipine 5 mg daily. Hold Losartan 100 mg daily given cardiac catheterization today and will hold Toprol -XL 25 mg daily for now given his heart rate in the 40's to 50's this morning.  3. HLD - Will recheck an FLP with AM labs. He has been continued on Zetia  10 mg daily and Atorvastatin  40 mg daily.  4. Type 2 DM - Hgb A1c pending. On Metformin, Trulicity and and Jardiance as an outpatient.    For questions or updates, please contact  [facility] Please consult [email or web address] for contact info under   Signed, Laymon CHRISTELLA Qua, PA-C  01/08/2024 7:51 AM

## 2024-01-08 NOTE — H&P (View-Only) (Signed)
 Cardiology Consultation   Patient ID: Greg Alvarez MRN: [id number]; DOB: [birth year]  Admit date: 01/07/2024 Date of Consult: 01/08/2024  PCP:  Dasie Perkins, PA   [facility] Providers Cardiologist:  Peter Jordan, MD   {  Patient Profile:  Greg Alvarez is a 63 y.o. male with a hx of CAD (s/p DES to LAD in 08/2016, cath in 01/2023 with patent LAD stent, stable disease along LCx and overall small in caliber and 95% stenosis along RCA with DESx1 and unsuccessful PCI of the PL branch as the vessel was occluded unable to be crossed with a wire with recommendations to consider repeat PCI of the PDA with atherectomy if refractory angina), HTN, HLD, Type 2 DM and OSA who is being seen 01/08/2024 for the evaluation of chest pain at the request of Dr. Willette.  History of Present Illness:  Mr. Swift was last examined by Dr. Jordan in 08/2023 and denied any recent anginal symptoms. Was going to the gym several days a week and denied any symptoms with this. No changes were made to his cardiac medications and he was continued on Amlodipine 5 mg daily, ASA 81 mg daily, Atorvastatin 40 mg daily, Plavix  75 mg daily, Jardiance 12.5 mg daily, Imdur 30 mg daily and Losartan 100 mg daily.  He presented to [facility] yesterday evening for evaluation of chest pain. In talking with the patient today, he reports having baseline dyspnea on exertion and fatigue with no acute changes in this. Starting last Friday, he reports having episodes of left-sided chest pressure along his pectoral region which would radiate into his back. Symptoms would occur at rest or with activity. Episodes initially lasted for several minutes and then resolved but he reports having more constant chest pain throughout the day yesterday which prompted him to seek evaluation. Pain is not associated with positional changes or food consumption. Says his prior anginal symptoms were shortness of breath but he does report  having been diagnosed with angina in the past and feels like this resembles that. He has been compliant with his medications and has not missed any recent doses. He denies any orthopnea, PND or pitting edema. He does have known OSA but intolerant to CPAP due to claustrophobia and PTSD.  Initial labs showed WBC 9.5, Hgb 15.3, platelets 279, Na+ 141, K+ 4.3 and creatinine 0.90. Initial and repeat Troponin T negative at < 15. CXR with no acute abnormalities. EKG shows NSR, HR 61 with 1st degree AV block and inferior infarct pattern with slight TWI along V5 and V6.   Past Medical History:  Diagnosis Date   Coronary artery disease    Diabetes mellitus type 2, noninsulin dependent (HCC)    GERD (gastroesophageal reflux disease)    High cholesterol    Hypertension    under control with med., has been on med. x 10 yr.   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   left shoulder, states unable to raise left arm   Myocardial infarction [facility])    Panic attacks    Rheumatoid arthritis (HCC)    Sleep apnea    Tenosynovitis of wrist 03/2013   right   Wears glasses     Past Surgical History:  Procedure Laterality Date   APPENDECTOMY  02/27/1978   BIOPSY  04/11/2018   Procedure: BIOPSY;  Surgeon: Shaaron Lamar HERO, MD;  [facility];  Service: Endoscopy;;  esophagus   BIOPSY  07/21/2021   Procedure: BIOPSY;  Surgeon: Shaaron Lamar  M, MD;  [facility];  Service: Endoscopy;;   CARDIAC CATHETERIZATION  02/28/2000   1 stent placed in LAD   CARPAL TUNNEL RELEASE Left 07/29/2013   Procedure: LEFT CARPAL TUNNEL RELEASE;  Surgeon: Arley JONELLE Curia, MD;  Location: [facility];  Service: Orthopedics;  Laterality: Left;   COLONOSCOPY N/A 01/12/2014   Procedure: COLONOSCOPY;  Surgeon: Lamar CHRISTELLA Hollingshead, MD;  [facility];  Service: Endoscopy;  Laterality: N/A;  9:30 AM   COLONOSCOPY WITH PROPOFOL  N/A 07/21/2021   Procedure: COLONOSCOPY WITH PROPOFOL ;  Surgeon: Hollingshead Lamar CHRISTELLA, MD;  [facility];  Service: Endoscopy;  Laterality: N/A;  10:30am   CORONARY LITHOTRIPSY N/A 02/13/2023   Procedure: CORONARY LITHOTRIPSY;  Surgeon: Jordan, Peter M, MD;  Location: [facility] INVASIVE CV LAB;  Service: Cardiovascular;  Laterality: N/A;   CORONARY STENT INTERVENTION N/A 02/13/2023   Procedure: CORONARY STENT INTERVENTION;  Surgeon: Jordan, Peter M, MD;  Location: [facility] INVASIVE CV LAB;  Service: Cardiovascular;  Laterality: N/A;   ESOPHAGOGASTRODUODENOSCOPY (EGD) WITH PROPOFOL  N/A 04/11/2018   Procedure: ESOPHAGOGASTRODUODENOSCOPY (EGD) WITH PROPOFOL ;  Surgeon: Hollingshead Lamar CHRISTELLA, MD;  [facility];  Service: Endoscopy;  Laterality: N/A;  1:15pm   ESOPHAGOGASTRODUODENOSCOPY (EGD) WITH PROPOFOL  N/A 07/21/2021   Procedure: ESOPHAGOGASTRODUODENOSCOPY (EGD) WITH PROPOFOL ;  Surgeon: Hollingshead Lamar CHRISTELLA, MD;  [facility];  Service: Endoscopy;  Laterality: N/A;   LEFT HEART CATH AND CORONARY ANGIOGRAPHY N/A 08/09/2021   Procedure: LEFT HEART CATH AND CORONARY ANGIOGRAPHY;  Surgeon: Claudene Victory ORN, MD;  Location: [facility] INVASIVE CV LAB;  Service: Cardiovascular;  Laterality: N/A;   LEFT HEART CATH AND CORONARY ANGIOGRAPHY N/A 02/13/2023   Procedure: LEFT HEART CATH AND CORONARY ANGIOGRAPHY;  Surgeon: Jordan, Peter M, MD;  Location: [facility] INVASIVE CV LAB;  Service: Cardiovascular;  Laterality: N/A;   SHOULDER ARTHROSCOPY W/ ROTATOR CUFF REPAIR  05/28/2013   left-DSC   TENDON REPAIR Right    hand   TENOSYNOVECTOMY Right 04/08/2013   Procedure: TENOSYNOVECTOMY/FLEXOR TENDONS RIGHT WRIST;  Surgeon: Arley JONELLE Curia, MD;  Location: [facility];  Service: Orthopedics;  Laterality: Right;     Home Medications:  Prior to Admission medications   Medication Sig Start Date End Date Taking? Authorizing Provider  amLODipine (NORVASC) 5 MG tablet Take 5 mg by mouth daily. 10/30/19  Yes [provider]  aspirin  81 MG chewable tablet Chew 81 mg by mouth daily. 04/25/14   Yes [provider]  atorvastatin (LIPITOR) 40 MG tablet Take 40 mg by mouth at bedtime.   Yes [provider]  bismuth subsalicylate (PEPTO BISMOL) 262 MG/15ML suspension Take 30 mLs by mouth every 6 (six) hours as needed for indigestion or diarrhea or loose stools.   Yes [provider]  clopidogrel  (PLAVIX ) 75 MG tablet TAKE 1 TABLET EVERY DAY WITH BREAKFAST 07/12/23  Yes Jordan, Peter M, MD  Dulaglutide (TRULICITY) 1.5 MG/0.5ML SOAJ Inject 1.5 mg into the skin every Monday.   Yes [provider]  empagliflozin (JARDIANCE) 25 MG TABS tablet Take 12.5 mg by mouth daily. 05/15/22  Yes [provider]  escitalopram (LEXAPRO) 10 MG tablet Take 10 mg by mouth daily. 08/24/22  Yes [provider]  ezetimibe  (ZETIA ) 10 MG tablet TAKE 1 TABLET EVERY DAY 12/24/23  Yes Jordan, Peter M, MD  hydrOXYzine (ATARAX) 25 MG tablet Take 25 mg by mouth 3 (three) times daily as needed for anxiety. 04/05/21  Yes [provider]  isosorbide mononitrate (IMDUR) 30 MG 24 hr tablet Take 30 mg by mouth daily.   Yes [provider]  losartan (COZAAR) 100 MG tablet Take 100 mg by mouth daily.   Yes [provider]  metFORMIN (GLUCOPHAGE-XR) 500 MG 24 hr tablet Take 500 mg by mouth in the morning and at bedtime.   Yes [provider]  metoprolol  succinate (TOPROL -XL) 25 MG 24 hr tablet TAKE 1 TABLET EVERY DAY 09/01/22  Yes Jordan, Peter M, MD  Mometasone Furoate [facility] HFA) 200 MCG/ACT AERO Inhale 2 puffs into the lungs daily.   Yes [provider]  Multiple Vitamin (ONE-A-DAY MENS PO) Take 1 tablet by mouth daily.    Yes [provider]  nitroGLYCERIN  (NITRODUR - DOSED IN MG/24 HR) 0.1 mg/hr patch Place 0.1 mg onto the skin daily as needed (chest pain).   Yes [provider]  Omega-3 Fatty Acids (FISH OIL) 1000 MG CAPS Take 1,000 mg by mouth 2 (two) times daily.   Yes [provider]  pantoprazole   (PROTONIX ) 40 MG tablet Take 40 mg by mouth daily. 10/30/19  Yes [provider]  traZODone (DESYREL) 100 MG tablet Take 100 mg by mouth at bedtime.   Yes [provider]    Scheduled Meds:  aspirin   81 mg Oral Daily   atorvastatin  40 mg Oral QHS   clopidogrel   75 mg Oral Daily   enoxaparin (LOVENOX) injection  0.5 mg/kg Subcutaneous Q24H   ezetimibe   10 mg Oral Daily   insulin aspart  0-5 Units Subcutaneous QHS   insulin aspart  0-9 Units Subcutaneous TID WC   omega-3 acid ethyl esters  2 g Oral Daily   pantoprazole   40 mg Oral Daily   traZODone  100 mg Oral QHS   Continuous Infusions:  lactated ringers  50 mL/hr at 01/08/24 0453   PRN Meds: acetaminophen , hydrOXYzine, melatonin, morphine injection, nitroGLYCERIN , oxyCODONE , polyethylene glycol, prochlorperazine  Allergies:    Allergies  Allergen Reactions   Ativan [Lorazepam] Other (See Comments)    Hyperactivity    Benazepril Cough    Social History:   Social History   Socioeconomic History   Marital status: Divorced    Spouse name: Not on file   Number of children: Not on file   Years of education: Not on file   Highest education level: Not on file  Occupational History   Not on file  Tobacco Use   Smoking status: Former    Current packs/day: 0.00    Types: Cigarettes    Quit date: 03/13/1998    Years since quitting: 25.8   Smokeless tobacco: Never  Vaping Use   Vaping status: Never Used  Substance and Sexual Activity   Alcohol use: Not Currently    Comment: once or twice a year   Drug use: No   Sexual activity: Yes    Partners: Female  Other Topics Concern   Not on file  Social History Narrative   Not on file    Family History:    Family History  Problem Relation Age of Onset   Anesthesia problems Sister        hard to wake up post-op   Parkinson's disease Sister    Multiple sclerosis Sister    Epilepsy Mother    Heart disease Father    Parkinson's disease Father    Heart  failure Father    Heart attack Father    Heart disease Brother    Heart attack Brother  Stroke Brother    Colon cancer Neg Hx    Gastric cancer Neg Hx    Esophageal cancer Neg Hx      ROS:  Please see the history of present illness.   All other ROS reviewed and negative.     Physical Exam/Data: Vitals:   01/07/24 1930 01/07/24 2004 01/07/24 2353 01/08/24 0418  BP: 128/81 128/84 106/66 113/69  Pulse: (!) 58 (!) 58 (!) 58 (!) 55  Resp: 20 18 16 16   Temp:  97.8 F (36.6 C) 98 F (36.7 C) 98.2 F (36.8 C)  TempSrc:  Oral Oral Oral  SpO2: 90% 96% 93% 92%  Weight:  104 kg    Height:        Intake/Output Summary (Last 24 hours) at 01/08/2024 0751 Last data filed at 01/08/2024 0453 Gross per 24 hour  Intake 454.18 ml  Output --  Net 454.18 ml      01/07/2024    8:04 PM 01/07/2024    6:09 PM 12/04/2023    8:31 PM  Last 3 Weights  Weight (lbs) 229 lb 4.5 oz 230 lb 230 lb  Weight (kg) 104 kg 104.327 kg 104.327 kg     Body mass index is 37.01 kg/m.  General:  Well nourished, well developed male appearing in no acute distress HEENT: normal Neck: no JVD Vascular: No carotid bruits; Distal pulses 2+ bilaterally Cardiac:  normal S1, S2; RRR; no murmur  Lungs:  clear to auscultation bilaterally, no wheezing, rhonchi or rales  Abd: soft, nontender, no hepatomegaly  Ext: no pitting edema Musculoskeletal:  No deformities, BUE and BLE strength normal and equal Skin: warm and dry  Neuro:  CNs 2-12 intact, no focal abnormalities noted Psych:  Normal affect   EKG:  The EKG was personally reviewed and demonstrates: NSR, HR 61 with 1st degree AV block and inferior infarct pattern with slight TWI along V5 and V6.   Telemetry:  Telemetry was personally reviewed and demonstrates: Sinus bradycardia, HR in 40's to 50's.   Relevant CV Studies:  Cardiac Catheterization: 02/13/2023   Mid LAD lesion is 25% stenosed.   1st Mrg lesion is 70% stenosed.   Prox RCA lesion is 95%  stenosed.   RPAV lesion is 100% stenosed.   RPDA lesion is 80% stenosed.   Prox Cx lesion is 80% stenosed.   A drug-eluting stent was successfully placed using a SYNERGY XD 4.0X20.   Balloon angioplasty was performed using a BALLN [place] EMERGE MR 2.5X12.   Post intervention, there is a 0% residual stenosis.   Post intervention, there is a 70% residual stenosis.   Post intervention, there is a 0% residual stenosis.   The left ventricular systolic function is normal.   LV end diastolic pressure is normal.   The left ventricular ejection fraction is 55-65% by visual estimate.   Recommend uninterrupted dual antiplatelet therapy with Aspirin  81mg  daily and Clopidogrel  75mg  daily for a minimum of 12 months (ACS-Class I recommendation).   2 vessel obstructive CAD. Continued patency of stent in the LAD. Disease in LCx is unchanged from prior angiogram and these branches are small in caliber. New high grade stenosis in the proximal to mid RCA. New occlusion of PL branch with left to right collaterals. Moderate obstructive disease in the PDA Good LV function with inferobasal HK. EF 55-60% Normal LVEDP Successful PCI and stenting of the proximal to mid RCA with DES Unsuccessful PCI of the PDA. Lesion treated with balloon angioplasty and Shockwave lithotripsy  with rigid unexpanding lesion Unsuccessful PCI of the PL branch of the RCA. This vessel is occluded and unable to cross with a wire. It is collateralized.    Plan: DAPT for one year. Optimize medical therapy. I think he will note significant improvement in his symptoms. If he has refractory limiting angina could consider repeat PCI of the PDA with atherectomy  Laboratory Data: High Sensitivity Troponin:  No results for input(s): TROPONINIHS in the last 720 hours.   Chemistry Recent Labs  Lab 01/07/24 1820 01/08/24 0414  NA 141 141  K 4.3 4.3  CL 102 103  CO2 27 28  GLUCOSE 114* 114*  BUN 16 15  CREATININE 0.90 0.88  CALCIUM 9.8 9.1  MG   --  2.2  GFRNONAA >60 >60  ANIONGAP 12 10    No results for input(s): PROT, ALBUMIN, AST, ALT, ALKPHOS, BILITOT in the last 168 hours. Lipids No results for input(s): CHOL, TRIG, HDL, LABVLDL, LDLCALC, CHOLHDL in the last 168 hours.  Hematology Recent Labs  Lab 01/07/24 1820 01/08/24 0414  WBC 9.5 9.4  RBC 4.90 4.54  HGB 15.3 14.2  HCT 45.0 41.6  MCV 91.8 91.6  MCH 31.2 31.3  MCHC 34.0 34.1  RDW 13.0 13.0  PLT 279 256   Thyroid No results for input(s): TSH, FREET4 in the last 168 hours.  BNPNo results for input(s): BNP, PROBNP in the last 168 hours.  DDimer No results for input(s): DDIMER in the last 168 hours.  Radiology/Studies:  DG Chest Port 1 View Result Date: 01/07/2024 CLINICAL DATA:  Chest pain EXAM: PORTABLE CHEST 1 VIEW COMPARISON:  Chest x-ray 12/04/2023 FINDINGS: The heart size and mediastinal contours are within normal limits. Both lungs are clear. The visualized skeletal structures are unremarkable. IMPRESSION: No active disease. Electronically Signed   By: Greig Pique M.D.   On: 01/07/2024 19:00     Assessment and Plan:  1. Chest Pain with Mixed Features/CAD - He has a history of complex CAD as outlined above with DES to the LAD in 2018 and cardiac catheterization in 01/2023 with DES to the RCA and unsuccessful PCI of the PL branch and residual LCx disease.   - Presents with episodic chest pain over the past 5 days which can occur at rest or with activity. Troponin values have been negative and EKG shows old inferior infarct pattern but he does have T wave inversion along V5 and V6.   - Will review with Dr. Mallipeddi but anticipate a cardiac catheterization for definitive evaluation given his complex CAD history and continued chest pain at this time. He did have breakfast this morning and would therefore not be a candidate for an NST today. Suspect this would also be abnormal given his underlying residual disease. Echocardiogram  was performed this morning with results pending.   - Continue ASA 81 mg daily, Plavix  75 mg daily, Atorvastatin 40 mg daily, Imdur 30 mg daily, Amlodipine 5 mg daily. Will hold Toprol -XL for now given heart rate in the 40's to 50's.    2. HTN - BP is well-controlled, at 113/69 on most recent check. He has been continued on Imdur 30 mg daily and Amlodipine 5 mg daily. Hold Losartan 100 mg daily given cardiac catheterization today and will hold Toprol -XL 25 mg daily for now given his heart rate in the 40's to 50's this morning.  3. HLD - Will recheck an FLP with AM labs. He has been continued on Zetia  10 mg daily and Atorvastatin  40 mg daily.  4. Type 2 DM - Hgb A1c pending. On Metformin, Trulicity and and Jardiance as an outpatient.    For questions or updates, please contact  [facility] Please consult [email or web address] for contact info under   Signed, Laymon CHRISTELLA Qua, PA-C  01/08/2024 7:51 AM

## 2024-01-08 NOTE — Interval H&P Note (Signed)
 History and Physical Interval Note:  01/08/2024 4:26 PM  Greg Alvarez  has presented today for surgery, with the diagnosis of unstable angina.  The various methods of treatment have been discussed with the patient and family. After consideration of risks, benefits and other options for treatment, the patient has consented to  Procedure(s): LEFT HEART CATH AND CORONARY ANGIOGRAPHY (N/A) as a surgical intervention.  The patient's history has been reviewed, patient examined, no change in status, stable for surgery.  I have reviewed the patient's chart and labs.  Questions were answered to the patient's satisfaction.     Tripp Goins J Abdoulaye Drum

## 2024-01-08 NOTE — Progress Notes (Signed)
 PROGRESS NOTE    Patient: Greg Alvarez                            PCP: Dasie Perkins, PA                    DOB: 1961/02/12            DOA: 01/07/2024 FMW:[id number]             DOS: 01/08/2024, 10:34 AM   LOS: 0 days   Date of Service: The patient was seen and examined on 01/08/2024  Subjective:   The patient was seen and examined this morning. Hemodynamically stable. Still complaining of chest pain reporting on arrival 7/10 currently 4 out of 10  Brief Narrative:   Greg Alvarez is a 63 y.o. male with medical history significant for coronary artery disease status post PCI with stent placement on DAPT aspirin  Plavix , hypertension, hyperlipidemia, type 2 diabetes, OSA, who presents to the ER due to complaints of left-sided chest pain.    This started on Friday, 4 days ago and has been intermittent with no improving factor.  The pain has been sharp on the left side of the chest and radiating to the left side of his back.  Associated with shortness of breath.  7 out of 10 in intensity.  Took a nitroglycerin  2 days ago without improvement.   ER, 12 EKG showing sinus rhythm with first-degree AV block rate of 61.  QTc 442.  High-sensitivity troponin negative x 2.  States he has been compliant with his cardiac medications.  He took his aspirin  and Plavix .  He takes his home Lipitor at night.    Assessment & Plan:   Principal Problem:   Chest pain   Chest pain rule out ACS - Still having chest pain but improved with nitroglycerin  and analgesics currently 4/10 - History of coronary artery disease with DES to LAD in 2018, cardiac cath 02/16/2023 with DES to RCA - EKG, nonspecific changes, no obvious ST elevation or depression, abnormal T inversion -  Troponin x 2 negative -Last echo reviewed EF 50-/60%, good LV function, - Echo-completed pending reading by cardiology - Continuing aspirin /Plavix /atorvastatin/Imdur - Holding amlodipine and Toprol -XL for soft BP and  bradycardia  - S/p evaluation by cardiology-Dr. Christian -recommending transfer to [facility] for left/right heart catheterization today    Coronary artery disease status post PCI with stenting Resume home DAPT and statin Hold off beta-blocker due to soft BPs and bradycardia in the 50s Continue to monitor on telemetry Holding amlodipine for soft BP   Type 2 diabetes, blood sugar is controlled Last A1c 6.7 in 2010 Update hemoglobin A1c Short acting insulin if needed. Home medication includes metformin/Trulicity and Jardiance   Hyperlipidemia -monitoring LFTs, and lipid panel continue Zetia  and atorvastatin   GERD -continue PPI     Insomnia - Resume home trazodone   OSA CPAP nightly   Obesity Body mass index is 37.01 kg/m. Recommend weight loss outpatient with regular physical activity and healthy dieting.      ----------------------------------------------------------------------------------------------------------------------------------------------- Nutritional status:  The patient's BMI is: Body mass index is 37.01 kg/m. I agree with the assessment and plan as outlined  Nutrition Status:   --------------------------------------------------------------------------------------------------------------------------------------  DVT prophylaxis:     Code Status:   Code Status: Full Code  Family Communication: No family member present at bedside-  -Advance care planning has been discussed.   Admission status:  Status is: Observation The patient remains OBS appropriate and will d/c before 2 midnights.   Disposition: From  - home             Transferring to [facility] for cardiac cath today 01/08/2024   1 stable back home in next 24-48 hours  Procedures:   No admission procedures for hospital encounter.   Antimicrobials:  Anti-infectives (From admission, onward)    None        Medication:   aspirin   81 mg Oral Daily   atorvastatin  40 mg Oral QHS    clopidogrel   75 mg Oral Daily   enoxaparin (LOVENOX) injection  50 mg Subcutaneous Q24H   ezetimibe   10 mg Oral Daily   insulin aspart  0-5 Units Subcutaneous QHS   insulin aspart  0-9 Units Subcutaneous TID WC   isosorbide mononitrate  30 mg Oral Daily   omega-3 acid ethyl esters  2 g Oral Daily   pantoprazole   40 mg Oral Daily   traZODone  100 mg Oral QHS    acetaminophen , hydrOXYzine, melatonin, morphine injection, nitroGLYCERIN , oxyCODONE , polyethylene glycol, prochlorperazine   Objective:   Vitals:   01/07/24 1930 01/07/24 2004 01/07/24 2353 01/08/24 0418  BP: 128/81 128/84 106/66 113/69  Pulse: (!) 58 (!) 58 (!) 58 (!) 55  Resp: 20 18 16 16   Temp:  97.8 F (36.6 C) 98 F (36.7 C) 98.2 F (36.8 C)  TempSrc:  Oral Oral Oral  SpO2: 90% 96% 93% 92%  Weight:  104 kg    Height:        Intake/Output Summary (Last 24 hours) at 01/08/2024 1034 Last data filed at 01/08/2024 0453 Gross per 24 hour  Intake 454.18 ml  Output --  Net 454.18 ml   Filed Weights   01/07/24 1809 01/07/24 2004  Weight: 104.3 kg 104 kg     Physical examination:   General:  AAO x 3,  cooperative, no distress;   HEENT:  Normocephalic, PERRL, otherwise with in Normal limits   Neuro:  CNII-XII intact. , normal motor and sensation, reflexes intact   Lungs:   Clear to auscultation BL, Respirations unlabored,  No wheezes / crackles  Cardio:    S1/S2, RRR, No murmure, No Rubs or Gallops   Abdomen:  Soft, non-tender, bowel sounds active all four quadrants, no guarding or peritoneal signs.  Muscular  skeletal:  Limited exam -global generalized weaknesses - in bed, able to move all 4 extremities,   2+ pulses,  symmetric, No pitting edema  Skin:  Dry, warm to touch, negative for any Rashes,  Wounds: Please see nursing documentation       ------------------------------------------------------------------------------------------------------------------------------------------    LABs:      Latest Ref Rng & Units 01/08/2024    4:14 AM 01/07/2024    6:20 PM 02/13/2023    7:16 AM  CBC  WBC 4.0 - 10.5 K/uL 9.4  9.5  8.8   Hemoglobin 13.0 - 17.0 g/dL 85.7  84.6  84.9   Hematocrit 39.0 - 52.0 % 41.6  45.0  43.8   Platelets 150 - 400 K/uL 256  279  255       Latest Ref Rng & Units 01/08/2024    4:14 AM 01/07/2024    6:20 PM 02/13/2023    7:16 AM  CMP  Glucose 70 - 99 mg/dL 885  885  813   BUN 8 - 23 mg/dL 15  16  15    Creatinine 0.61 - 1.24 mg/dL 9.11  0.90  0.92   Sodium 135 - 145 mmol/L 141  141  136   Potassium 3.5 - 5.1 mmol/L 4.3  4.3  3.8   Chloride 98 - 111 mmol/L 103  102  103   CO2 22 - 32 mmol/L 28  27  23    Calcium 8.9 - 10.3 mg/dL 9.1  9.8  9.2        Micro Results No results found for this or any previous visit (from the past 240 hours).  Radiology Reports DG Chest Port 1 View Result Date: 01/07/2024 CLINICAL DATA:  Chest pain EXAM: PORTABLE CHEST 1 VIEW COMPARISON:  Chest x-ray 12/04/2023 FINDINGS: The heart size and mediastinal contours are within normal limits. Both lungs are clear. The visualized skeletal structures are unremarkable. IMPRESSION: No active disease. Electronically Signed   By: Greig Pique M.D.   On: 01/07/2024 19:00    SIGNED: Adriana DELENA Grams, MD, FHM. FAAFP. [facility] Time spent - 55 min.  In seeing, evaluating and examining the patient. Reviewing medical records, labs, drawn plan of care. [facility],  Pager (please use [email or web address] to page/ text) Please use Epic Secure Chat for non-urgent communication (7AM-7PM)  If 7PM-7AM, please contact night-coverage [email or web address], 01/08/2024, 10:34 AM

## 2024-01-08 NOTE — Progress Notes (Signed)
 PHARMACY - ANTICOAGULATION CONSULT NOTE  Pharmacy Consult for heparin  dosing Indication: Chest pain/ACS  Allergies  Allergen Reactions   Ativan [Lorazepam] Other (See Comments)    Hyperactivity    Benazepril Cough    Patient Measurements: Height: 5' 6 (167.6 cm) Weight: 104 kg (229 lb 4.5 oz) IBW/kg (Calculated) : 63.8 HEPARIN  DW (KG): 87.1  Vital Signs: Temp: 98.2 F (36.8 C) (11/11 0418) Temp Source: Oral (11/11 0418) BP: 113/69 (11/11 0418) Pulse Rate: 55 (11/11 0418)  Labs: Recent Labs    01/07/24 1820 01/08/24 0414  HGB 15.3 14.2  HCT 45.0 41.6  PLT 279 256  CREATININE 0.90 0.88    Estimated Creatinine Clearance: 99.6 mL/min (by C-G formula based on SCr of 0.88 mg/dL).   Medical History: Past Medical History:  Diagnosis Date   Coronary artery disease    Diabetes mellitus type 2, noninsulin dependent (HCC)    GERD (gastroesophageal reflux disease)    High cholesterol    Hypertension    under control with med., has been on med. x 10 yr.   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   left shoulder, states unable to raise left arm   Myocardial infarction [facility])    Panic attacks    Rheumatoid arthritis (HCC)    Sleep apnea    Tenosynovitis of wrist 03/2013   right   Wears glasses     Medications:  Medications Prior to Admission  Medication Sig Dispense Refill Last Dose/Taking   amLODipine (NORVASC) 5 MG tablet Take 5 mg by mouth daily.   01/07/2024 at  8:00 AM   aspirin  81 MG chewable tablet Chew 81 mg by mouth daily.   01/07/2024 at  8:00 AM   atorvastatin (LIPITOR) 40 MG tablet Take 40 mg by mouth at bedtime.   01/06/2024 at  9:00 PM   bismuth subsalicylate (PEPTO BISMOL) 262 MG/15ML suspension Take 30 mLs by mouth every 6 (six) hours as needed for indigestion or diarrhea or loose stools.   01/05/2024   clopidogrel  (PLAVIX ) 75 MG tablet TAKE 1 TABLET EVERY DAY WITH BREAKFAST 90 tablet 3 01/07/2024 at  8:00 AM   Dulaglutide (TRULICITY) 1.5 MG/0.5ML SOAJ  Inject 1.5 mg into the skin every Monday.   01/07/2024 Morning   empagliflozin (JARDIANCE) 25 MG TABS tablet Take 12.5 mg by mouth daily.   01/07/2024 at  8:00 AM   escitalopram (LEXAPRO) 10 MG tablet Take 10 mg by mouth daily.   01/07/2024 at  8:00 AM   ezetimibe  (ZETIA ) 10 MG tablet TAKE 1 TABLET EVERY DAY 90 tablet 3 01/07/2024 at  8:00 AM   hydrOXYzine (ATARAX) 25 MG tablet Take 25 mg by mouth 3 (three) times daily as needed for anxiety.   01/07/2024 at  8:00 AM   isosorbide mononitrate (IMDUR) 30 MG 24 hr tablet Take 30 mg by mouth daily.   01/07/2024 at  8:00 AM   losartan (COZAAR) 100 MG tablet Take 100 mg by mouth daily.   01/07/2024 at  8:00 AM   metFORMIN (GLUCOPHAGE-XR) 500 MG 24 hr tablet Take 500 mg by mouth in the morning and at bedtime.   01/07/2024 at  8:00 AM   metoprolol  succinate (TOPROL -XL) 25 MG 24 hr tablet TAKE 1 TABLET EVERY DAY 90 tablet 3 01/07/2024 at  8:00 AM   Mometasone Furoate (ASMANEX HFA) 200 MCG/ACT AERO Inhale 2 puffs into the lungs daily.   01/07/2024 at  8:00 AM   Multiple Vitamin (ONE-A-DAY MENS PO) Take 1 tablet  by mouth daily.    01/07/2024 at  8:00 AM   nitroGLYCERIN  (NITRODUR - DOSED IN MG/24 HR) 0.1 mg/hr patch Place 0.1 mg onto the skin daily as needed (chest pain).   01/06/2024 Morning   Omega-3 Fatty Acids (FISH OIL) 1000 MG CAPS Take 1,000 mg by mouth 2 (two) times daily.   01/07/2024 at  8:00 AM   pantoprazole  (PROTONIX ) 40 MG tablet Take 40 mg by mouth daily.   01/07/2024 at  8:00 AM   traZODone (DESYREL) 100 MG tablet Take 100 mg by mouth at bedtime.   01/06/2024 at  9:00 PM    Assessment: 63 YO M with PMH of CAD status post PCI with stent replacement on DAPT (aspirin , Plavix ), HTN, HLD, T2DM, and OSA. Presenting with chest pain. Received Lovenox 52.5 mg (0.5mg /kg treatment dose) last night (11/10, 2200). Plan to transfer to [facility] for left/right heart catherization today (11/11)  Goal of Therapy:  Heparin  level 0.3-0.7 units/ml Monitor  platelets by anticoagulation protocol: Yes   Plan:  Discontinue Lovenox Give 4000 units bolus x 1 Start heparin  infusion at 1200 units/hr Check anti-Xa level in 6 hours and daily while on heparin  Continue to monitor H&H and platelets  Greg Alvarez 01/08/2024,10:46 AM

## 2024-01-09 ENCOUNTER — Encounter (HOSPITAL_COMMUNITY): Payer: Self-pay | Admitting: Cardiology

## 2024-01-09 ENCOUNTER — Encounter (HOSPITAL_COMMUNITY)
Admission: EM | Disposition: A | Discharge status: Home / Self Care | Payer: Self-pay | Source: Home / Self Care | Attending: Hospitalist

## 2024-01-09 DIAGNOSIS — R079 Chest pain, unspecified: Secondary | ICD-10-CM | POA: Diagnosis not present

## 2024-01-09 HISTORY — PX: CORONARY BALLOON ANGIOPLASTY: CATH118233

## 2024-01-09 LAB — CBC
HCT: 41.6 % (ref 39.0–52.0)
HCT: 38.6 % — ABNORMAL LOW (ref 39.0–52.0)
Hemoglobin: 14.2 g/dL (ref 13.0–17.0)
Hemoglobin: 13.5 g/dL (ref 13.0–17.0)
MCH: 31.3 pg (ref 26.0–34.0)
MCH: 31.5 pg (ref 26.0–34.0)
MCHC: 34.1 g/dL (ref 30.0–36.0)
MCHC: 35 g/dL (ref 30.0–36.0)
MCV: 91.6 fL (ref 80.0–100.0)
MCV: 90.2 fL (ref 80.0–100.0)
Platelets: 261 K/uL (ref 150–400)
Platelets: 253 K/uL (ref 150–400)
RBC: 4.54 MIL/uL (ref 4.22–5.81)
RBC: 4.28 MIL/uL (ref 4.22–5.81)
RDW: 13.1 % (ref 11.5–15.5)
RDW: 13.2 % (ref 11.5–15.5)
WBC: 8.8 K/uL (ref 4.0–10.5)
WBC: 10 K/uL (ref 4.0–10.5)
nRBC: 0 % (ref 0.0–0.2)
nRBC: 0 % (ref 0.0–0.2)

## 2024-01-09 LAB — BASIC METABOLIC PANEL WITH GFR
Anion gap: 12 (ref 5–15)
BUN: 14 mg/dL (ref 8–23)
CO2: 28 mmol/L (ref 22–32)
Calcium: 9.5 mg/dL (ref 8.9–10.3)
Chloride: 103 mmol/L (ref 98–111)
Creatinine, Ser: 1 mg/dL (ref 0.61–1.24)
GFR, Estimated: >60 mL/min (ref >60)
Glucose, Bld: 114 mg/dL — ABNORMAL HIGH (ref 70–99)
Potassium: 4.3 mmol/L (ref 3.5–5.1)
Sodium: 143 mmol/L (ref 135–145)

## 2024-01-09 LAB — LIPID PANEL
Cholesterol: 114 mg/dL (ref 0–200)
HDL: 30 mg/dL — ABNORMAL LOW (ref >40)
LDL Cholesterol: 57 mg/dL (ref 0–99)
Total CHOL/HDL Ratio: 3.8 ratio
Triglycerides: 136 mg/dL (ref <150)
VLDL: 27 mg/dL (ref 0–40)

## 2024-01-09 LAB — GLUCOSE, CAPILLARY
Glucose-Capillary: 99 mg/dL (ref 70–99)
Glucose-Capillary: 117 mg/dL — ABNORMAL HIGH (ref 70–99)
Glucose-Capillary: 158 mg/dL — ABNORMAL HIGH (ref 70–99)

## 2024-01-09 LAB — CREATININE, SERUM
Creatinine, Ser: 0.96 mg/dL (ref 0.61–1.24)
GFR, Estimated: >60 mL/min (ref >60)

## 2024-01-09 LAB — HEPARIN LEVEL (UNFRACTIONATED): Heparin Unfractionated: 0.34 [IU]/mL (ref 0.30–0.70)

## 2024-01-09 SURGERY — CORONARY BALLOON ANGIOPLASTY
Anesthesia: LOCAL

## 2024-01-09 MED ORDER — ENOXAPARIN SODIUM 40 MG/0.4ML IJ SOSY
40.0000 mg | PREFILLED_SYRINGE | INTRAMUSCULAR | Status: DC
  Administered 2024-01-10: 40 mg via SUBCUTANEOUS
  Filled 2024-01-09: qty 0.4

## 2024-01-09 MED ORDER — FREE WATER: 500.0000 mL | Freq: Once | Status: DC

## 2024-01-09 MED ORDER — LIDOCAINE HCL (PF) 1 % IJ SOLN
INTRAMUSCULAR | Status: AC
  Filled 2024-01-09: qty 30

## 2024-01-09 MED ORDER — LIDOCAINE HCL (PF) 1 % IJ SOLN
INTRAMUSCULAR | Status: DC | PRN
  Administered 2024-01-09: 5 mL

## 2024-01-09 MED ORDER — MIDAZOLAM HCL (PF) 2 MG/2ML IJ SOLN
INTRAMUSCULAR | Status: DC | PRN
  Administered 2024-01-09: 1 mg via INTRAVENOUS
  Administered 2024-01-09: 2 mg via INTRAVENOUS

## 2024-01-09 MED ORDER — SODIUM CHLORIDE 0.9 % IV SOLN
INTRAVENOUS | Status: AC | PRN
  Administered 2024-01-09: 10 mL/h via INTRAVENOUS

## 2024-01-09 MED ORDER — HEPARIN SODIUM (PORCINE) 1000 UNIT/ML IJ SOLN
INTRAMUSCULAR | Status: AC
  Filled 2024-01-09: qty 10

## 2024-01-09 MED ORDER — FENTANYL CITRATE (PF) 100 MCG/2ML IJ SOLN
INTRAMUSCULAR | Status: AC
  Filled 2024-01-09: qty 2

## 2024-01-09 MED ORDER — HEPARIN SODIUM (PORCINE) 1000 UNIT/ML IJ SOLN
INTRAMUSCULAR | Status: DC | PRN
  Administered 2024-01-09: 10000 [IU] via INTRAVENOUS

## 2024-01-09 MED ORDER — HYDRALAZINE HCL 20 MG/ML IJ SOLN: 10.0000 mg | INTRAMUSCULAR | Status: AC | PRN

## 2024-01-09 MED ORDER — SODIUM CHLORIDE 0.9% FLUSH: 3.0000 mL | INTRAVENOUS | Status: DC | PRN

## 2024-01-09 MED ORDER — MIDAZOLAM HCL 2 MG/2ML IJ SOLN
INTRAMUSCULAR | Status: AC
  Filled 2024-01-09: qty 2

## 2024-01-09 MED ORDER — HEPARIN (PORCINE) IN NACL 1000-0.9 UT/500ML-% IV SOLN
INTRAVENOUS | Status: DC | PRN
  Administered 2024-01-09 (×2): 500 mL

## 2024-01-09 MED ORDER — FENTANYL CITRATE (PF) 100 MCG/2ML IJ SOLN
INTRAMUSCULAR | Status: DC | PRN
  Administered 2024-01-09 (×2): 25 ug via INTRAVENOUS

## 2024-01-09 MED ORDER — SODIUM CHLORIDE 0.9 % IV SOLN: 250.0000 mL | INTRAVENOUS | Status: DC | PRN

## 2024-01-09 MED ORDER — IOHEXOL 350 MG/ML SOLN
INTRAVENOUS | Status: DC | PRN
  Administered 2024-01-09: 65 mL

## 2024-01-09 MED ORDER — FREE WATER
500.0000 mL | Freq: Once | Status: AC
  Administered 2024-01-09: 500 mL via ORAL

## 2024-01-09 MED ORDER — SODIUM CHLORIDE 0.9% FLUSH
3.0000 mL | Freq: Two times a day (BID) | INTRAVENOUS | Status: DC
  Administered 2024-01-09 – 2024-01-10 (×2): 3 mL via INTRAVENOUS

## 2024-01-09 MED FILL — Verapamil HCl IV Soln 2.5 MG/ML: INTRAVENOUS | Qty: 2 | Status: AC

## 2024-01-09 MED FILL — Nitroglycerin IV Soln 100 MCG/ML in D5W: INTRA_ARTERIAL | Qty: 10 | Status: AC

## 2024-01-09 SURGICAL SUPPLY — 21 items
BALL DC AGENT 4.0X15 (BALLOONS) IMPLANT
BALL DC AGENT 4.0X20 (BALLOONS) IMPLANT
BALLOON EMERGE MR 2.5X12 (BALLOONS) IMPLANT
BALLOON SAPPHIRE NC24 3.0X12 (BALLOONS) IMPLANT
BALLOON SAPPHIRE NC24 4.5X12 (BALLOONS) IMPLANT
BALLOON TAKERU 1.5X12 (BALLOONS) IMPLANT
BALLOON TAKERU 2.0X12 (BALLOONS) IMPLANT
BALLOON WOLVERINE 4.00X10 (BALLOONS) IMPLANT
CATH GUIDELINER COAST (CATHETERS) IMPLANT
CATH LAUNCHER 6FR AL1 (CATHETERS) IMPLANT
CATH VISTA GUIDE 6FR AL2 (CATHETERS) IMPLANT
KIT ENCORE 26 ADVANTAGE (KITS) IMPLANT
KIT MICROPUNCTURE NIT STIFF (SHEATH) IMPLANT
MAT PREVALON FULL STRYKER (MISCELLANEOUS) IMPLANT
PACK CARDIAC CATHETERIZATION (CUSTOM PROCEDURE TRAY) ×1 IMPLANT
SET ATX-X65L (MISCELLANEOUS) IMPLANT
SHEATH PINNACLE 6F 10CM (SHEATH) IMPLANT
SHEATH PROBE COVER 6X72 (BAG) IMPLANT
TUBING CIL FLEX 10 FLL-RA (TUBING) IMPLANT
WIRE ASAHI GRAND SLAM 180CM (WIRE) IMPLANT
WIRE EMERALD 3MM-J .035X150CM (WIRE) IMPLANT

## 2024-01-09 NOTE — Progress Notes (Signed)
 Patient's 10fr right femoral arterial sheath was pulled per order. Manual pressure was held for 35 minutes. Patient tolerated pull well. Sheath site during and post sheath pull was clean, dry, intact and level 1 hematoma. Patients VS remained WNL's during and post sheath pull. Patients bedrest began at 19:55 and ends in 6 hours at 13:55. Vascular site assessment was a level 1. Sheath removal site was dressed with a gauze dressing and was clean, dry, and intact with some bruising. A frogleg dressing was applied to add additional support across hematoma. Patient was educated about post sheath pull management and stated he understood and had no questions.

## 2024-01-09 NOTE — Progress Notes (Signed)
 01/08/2024 2140 Received pt to room 6E-23 from cathlab, s/p HC.  Pt is A&O, no C/O voiced.  Tele monitor applied and CCMD  notified.  Oriented to room, call light and bed.  Call bell in reach. Greg Alvarez

## 2024-01-09 NOTE — Progress Notes (Addendum)
  Progress Note  Patient Name: Greg Alvarez Date of Encounter: 01/09/2024 [facility] Cardiologist: Peter Jordan, MD   Interval Summary    Mild intermittent episodes of chest pain since admission, but not as severe as initial presentation.   Vital Signs Vitals:   01/08/24 2115 01/08/24 2129 01/08/24 2146 01/08/24 2340  BP: 117/80  118/71 107/67  Pulse: 62 63 63 71  Resp: 17 (!) 23 18 16   Temp:   98.2 F (36.8 C) 98.5 F (36.9 C)  TempSrc:   Oral Oral  SpO2: 92% 95% 95% 91%  Weight:   104.8 kg   Height:        Intake/Output Summary (Last 24 hours) at 01/09/2024 0720 Last data filed at 01/08/2024 2341 Gross per 24 hour  Intake 503 ml  Output 800 ml  Net -297 ml      01/08/2024    9:46 PM 01/07/2024    8:04 PM 01/07/2024    6:09 PM  Last 3 Weights  Weight (lbs) 231 lb 0.7 oz 229 lb 4.5 oz 230 lb  Weight (kg) 104.8 kg 104 kg 104.327 kg      Telemetry/ECG   Sinus Rhythm - Personally Reviewed  Physical Exam  GEN: No acute distress.   Neck: No JVD Cardiac: RRR, no murmurs, rubs, or gallops.  Respiratory: Clear to auscultation bilaterally. GI: Soft, nontender, non-distended  MS: No edema VAS: right radial cath site stable   Assessment & Plan   63 y.o. male with a hx of CAD (s/p DES to LAD in 08/2016, cath in 01/2023 with patent LAD stent, stable disease along LCx and overall small in caliber and 95% stenosis along RCA with DESx1 and unsuccessful PCI of the PL branch as the vessel was occluded unable to be crossed with a wire with recommendations to consider repeat PCI of the PDA with atherectomy if refractory angina), HTN, HLD, Type 2 DM and OSA who was seen 01/08/2024 for the evaluation of chest pain at the request of Dr. Willette.  Chest pain CAD s/p DES-LAD '18, DES-RCA '12/24 -- history of complex CAD as outlined above with DES to the LAD in 2018 and cardiac catheterization in 01/2023 with DES to the RCA and unsuccessful PCI of the PL branch and  residual LCx disease  -- presented to [facility] with severe chest pain and shortness of breath, similar to prior angina.  -- Underwent cardiac cath 11/11 with Dr. Elmira, attempted but unsuccessful intervention to RCA. Considering staged intervention with atherectomy pending Cr today  -- continue ASA, plavix , Imdur, atorvastatin, Zetia   HTN -- stable -- home Toprol  was held in the setting of bradycardia with rates in the 40-50s  HLD -- lipids pending -- continue atorvastatin, Zetia , Lovaza   DM -- Hgb A1c 7.4 -- metformin, trulicity and jardiance PTA  For questions or updates, please contact [facility] Please consult [email or web address] for contact info under   Signed, Manuelita Rummer, NP   I have personally seen and examined this patient. I agree with the assessment and plan as outlined above.  PCI of the RCA today per Dr. Jordan. Her is on ASA and Plavix .  Labs reviewed.   Lonni Cash, MD, [facility] 01/09/2024 10:40 AM

## 2024-01-09 NOTE — Interval H&P Note (Signed)
 History and Physical Interval Note:  01/09/2024 3:14 PM  Greg Alvarez  has presented today for surgery, with the diagnosis of cad.  The various methods of treatment have been discussed with the patient and family. After consideration of risks, benefits and other options for treatment, the patient has consented to  Procedure(s): CORONARY STENT INTERVENTION (N/A) as a surgical intervention.  The patient's history has been reviewed, patient examined, no change in status, stable for surgery.  I have reviewed the patient's chart and labs.  Questions were answered to the patient's satisfaction.   Cath Lab Visit (complete for each Cath Lab visit)  Clinical Evaluation Leading to the Procedure:   ACS: Yes.    Non-ACS:    Anginal Classification: CCS IV  Anti-ischemic medical therapy: Maximal Therapy (2 or more classes of medications)  Non-Invasive Test Results: No non-invasive testing performed  Prior CABG: No previous CABG        Greg Alvarez 01/09/2024 3:14 PM

## 2024-01-09 NOTE — Progress Notes (Addendum)
 PROGRESS NOTE    Patient: Greg Alvarez                            PCP: Dasie Perkins, PA                    DOB: [birth year]            DOA: 01/07/2024 FMW:[id number]             DOS: 01/09/2024, 5:29 PM   LOS: 1 day   Date of Service: The patient was seen and examined on 01/09/2024  Subjective:   Patient underwent cath today with proximal RCA PCI. Denies pain. VSS  Brief Narrative:   Greg Alvarez is a 63 y.o. male with medical history significant for coronary artery disease status post PCI with stent placement on DAPT aspirin  Plavix , hypertension, hyperlipidemia, type 2 diabetes, OSA, who presents to the ER due to complaints of left-sided chest pain.    This started on Friday, 4 days ago and has been intermittent with no improving factor.  The pain has been sharp on the left side of the chest and radiating to the left side of his back.  Associated with shortness of breath.  7 out of 10 in intensity.  Took a nitroglycerin  2 days ago without improvement.   ER, 12 EKG showing sinus rhythm with first-degree AV block rate of 61.  QTc 442.  High-sensitivity troponin negative x 2.  States he has been compliant with his cardiac medications.  He took his aspirin  and Plavix .  He takes his home Lipitor at night.  He underwent cath 11/11, severe mid RCA stenosis, PCI could not be completed.    Assessment & Plan:   Principal Problem:   Chest pain   Chest pain rule out ACS History of CAD status post PCI in the past - Unstable angina. - History of coronary artery disease with DES to LAD in 2018, cardiac cath 02/16/2023 with DES to RCA - EKG, nonspecific changes, no obvious ST elevation or depression, abnormal T inversion -  Troponin x 2 negative -Last echo reviewed EF 50-/60%, good LV function, - Echo-completed pending reading by cardiology - Continuing aspirin /Plavix /atorvastatin/Imdur - Holding amlodipine and Toprol -XL for soft BP and bradycardia  - S/p catheterization 11/11  and 11/12.  RCA stenosis with PCI completed 11/12 -Continue dual antiplatelet therapy with aspirin  and Plavix    Type 2 diabetes, blood sugar is controlled Last A1c 6.7 in 2010 Update hemoglobin A1c Short acting insulin if needed. Home medication includes metformin/Trulicity and Jardiance   Hyperlipidemia -monitoring LFTs, and lipid panel continue Zetia  and atorvastatin   GERD -continue PPI     Insomnia - Resume home trazodone   OSA CPAP nightly   Obesity Body mass index is 37.29 kg/m. Recommend weight loss outpatient with regular physical activity and healthy dieting.      ----------------------------------------------------------------------------------------------------------------------------------------------- Nutritional status:  The patient's BMI is: Body mass index is 37.29 kg/m. I agree with the assessment and plan as outlined  Nutrition Status:   --------------------------------------------------------------------------------------------------------------------------------------  DVT prophylaxis:  SCD's Start: 01/08/24 2158   Code Status:   Code Status: Full Code  Family Communication:   Admission status:   Status is: Observation The patient remains OBS appropriate and will d/c before 2 midnights.   Disposition: Home  Procedures:   No admission procedures for hospital encounter.   Antimicrobials:  Anti-infectives (From admission, onward)    None  Medication:   [MAR Hold] aspirin   81 mg Oral Daily   [MAR Hold] atorvastatin  40 mg Oral QHS   [MAR Hold] clopidogrel   75 mg Oral Daily   [MAR Hold] ezetimibe   10 mg Oral Daily   [MAR Hold] free water   500 mL Oral Once   free water   500 mL Oral Once   free water   500 mL Oral Once   [MAR Hold] insulin aspart  0-5 Units Subcutaneous QHS   [MAR Hold] insulin aspart  0-9 Units Subcutaneous TID WC   [MAR Hold] isosorbide mononitrate  30 mg Oral Daily   [MAR Hold] omega-3 acid ethyl esters   2 g Oral Daily   [MAR Hold] pantoprazole   40 mg Oral Daily   [MAR Hold] sodium chloride  flush  3 mL Intravenous Q12H   [MAR Hold] traZODone  100 mg Oral QHS    [MAR Hold] sodium chloride , sodium chloride , sodium chloride , [MAR Hold] acetaminophen , fentaNYL , Heparin  (Porcine) in NaCl, heparin  sodium (porcine), hydrALAZINE , [MAR Hold] hydrOXYzine, iohexol , lidocaine  (PF), [MAR Hold] melatonin, midazolam  PF, [MAR Hold]  morphine injection, [MAR Hold] nitroGLYCERIN , [MAR Hold] oxyCODONE , [MAR Hold] polyethylene glycol, [MAR Hold] prochlorperazine, [MAR Hold] sodium chloride  flush   Objective:   Vitals:   01/09/24 1639 01/09/24 1644 01/09/24 1707 01/09/24 1715  BP: 122/83 (!) 127/99 120/82 (!) 112/93  Pulse: 67 73 68 68  Resp: 19 (!) 45 (!) 21 (!) 23  Temp:      TempSrc:      SpO2: 93% 93% 92% 91%  Weight:      Height:        Intake/Output Summary (Last 24 hours) at 01/09/2024 1729 Last data filed at 01/09/2024 1515 Gross per 24 hour  Intake 744.21 ml  Output 1700 ml  Net -955.79 ml   Filed Weights   01/07/24 1809 01/07/24 2004 01/08/24 2146  Weight: 104.3 kg 104 kg 104.8 kg     Physical examination:     General: Alert, oriented not in any acute distress Chest: Clear CVs: S1, S2, no murmur, regular rhythm Abdomen: Soft, nontender Extremities: No edema, Rt femoral sheath+   ------------------------------------------------------------------------------------------------------------------------------------------    LABs:     Latest Ref Rng & Units 01/09/2024    7:35 AM 01/08/2024    4:14 AM 01/07/2024    6:20 PM  CBC  WBC 4.0 - 10.5 K/uL 8.8  9.4  9.5   Hemoglobin 13.0 - 17.0 g/dL 85.7  85.7  84.6   Hematocrit 39.0 - 52.0 % 41.6  41.6  45.0   Platelets 150 - 400 K/uL 261  256  279       Latest Ref Rng & Units 01/09/2024    7:35 AM 01/08/2024    4:14 AM 01/07/2024    6:20 PM  CMP  Glucose 70 - 99 mg/dL 885  885  885   BUN 8 - 23 mg/dL 14  15  16     Creatinine 0.61 - 1.24 mg/dL 8.99  9.11  9.09   Sodium 135 - 145 mmol/L 143  141  141   Potassium 3.5 - 5.1 mmol/L 4.3  4.3  4.3   Chloride 98 - 111 mmol/L 103  103  102   CO2 22 - 32 mmol/L 28  28  27    Calcium 8.9 - 10.3 mg/dL 9.5  9.1  9.8        Micro Results No results found for this or any previous visit (from the past 240 hours).  Radiology  Reports CARDIAC CATHETERIZATION Result Date: 01/09/2024   Prox RCA lesion is 90% stenosed.   Scoring balloon angioplasty was performed using a BALLOON WOLVERINE 4.00X10.   Post intervention, there is a 25% residual stenosis.   Recommend uninterrupted dual antiplatelet therapy with Aspirin  81mg  daily and Clopidogrel  75mg  daily for a minimum of 12 months (ACS-Class I recommendation). Successful PCI of the proximal RCA for instent restenosis with progressive balloon dilation, cutting balloon, and Drug coated balloon treatment.    SIGNED: Lenee Franze, MD, FHM. FAAFP. [facility] TIf 7PM-7AM, please contact night-coverage [email or web address], 01/09/2024, 5:29 PM

## 2024-01-09 NOTE — Progress Notes (Signed)
 PHARMACY - ANTICOAGULATION CONSULT NOTE  Pharmacy Consult for heparin  dosing Indication: Chest pain/ACS  Allergies  Allergen Reactions   Ativan [Lorazepam] Other (See Comments)    Hyperactivity    Benazepril Cough    Patient Measurements: Height: 5' 6 (167.6 cm) Weight: 104.8 kg (231 lb 0.7 oz) IBW/kg (Calculated) : 63.8 HEPARIN  DW (KG): 87.1  Vital Signs: Temp: 98 F (36.7 C) (11/12 0741) Temp Source: Oral (11/12 0741) BP: 118/72 (11/12 0741) Pulse Rate: 64 (11/12 0741)  Labs: Recent Labs    01/07/24 1820 01/08/24 0414 01/09/24 0735  HGB 15.3 14.2 14.2  HCT 45.0 41.6 41.6  PLT 279 256 261  HEPARINUNFRC  --   --  0.34  CREATININE 0.90 0.88 1.00    Estimated Creatinine Clearance: 88 mL/min (by C-G formula based on SCr of 1 mg/dL).   Medical History: Past Medical History:  Diagnosis Date   Coronary artery disease    Diabetes mellitus type 2, noninsulin dependent (HCC)    GERD (gastroesophageal reflux disease)    High cholesterol    Hypertension    under control with med., has been on med. x 10 yr.   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   left shoulder, states unable to raise left arm   Myocardial infarction [facility])    Panic attacks    Rheumatoid arthritis (HCC)    Sleep apnea    Tenosynovitis of wrist 03/2013   right   Wears glasses     Medications:  Medications Prior to Admission  Medication Sig Dispense Refill Last Dose/Taking   amLODipine (NORVASC) 5 MG tablet Take 5 mg by mouth daily.   01/07/2024 at  8:00 AM   aspirin  81 MG chewable tablet Chew 81 mg by mouth daily.   01/07/2024 at  8:00 AM   atorvastatin (LIPITOR) 40 MG tablet Take 40 mg by mouth at bedtime.   01/06/2024 at  9:00 PM   bismuth subsalicylate (PEPTO BISMOL) 262 MG/15ML suspension Take 30 mLs by mouth every 6 (six) hours as needed for indigestion or diarrhea or loose stools.   01/05/2024   clopidogrel  (PLAVIX ) 75 MG tablet TAKE 1 TABLET EVERY DAY WITH BREAKFAST 90 tablet 3  01/07/2024 at  8:00 AM   Dulaglutide (TRULICITY) 1.5 MG/0.5ML SOAJ Inject 1.5 mg into the skin every Monday.   01/07/2024 Morning   empagliflozin (JARDIANCE) 25 MG TABS tablet Take 12.5 mg by mouth daily.   01/07/2024 at  8:00 AM   escitalopram (LEXAPRO) 10 MG tablet Take 10 mg by mouth daily.   01/07/2024 at  8:00 AM   ezetimibe  (ZETIA ) 10 MG tablet TAKE 1 TABLET EVERY DAY 90 tablet 3 01/07/2024 at  8:00 AM   hydrOXYzine (ATARAX) 25 MG tablet Take 25 mg by mouth 3 (three) times daily as needed for anxiety.   01/07/2024 at  8:00 AM   isosorbide mononitrate (IMDUR) 30 MG 24 hr tablet Take 30 mg by mouth daily.   01/07/2024 at  8:00 AM   losartan (COZAAR) 100 MG tablet Take 100 mg by mouth daily.   01/07/2024 at  8:00 AM   metFORMIN (GLUCOPHAGE-XR) 500 MG 24 hr tablet Take 500 mg by mouth in the morning and at bedtime.   01/07/2024 at  8:00 AM   metoprolol  succinate (TOPROL -XL) 25 MG 24 hr tablet TAKE 1 TABLET EVERY DAY 90 tablet 3 01/07/2024 at  8:00 AM   Mometasone Furoate (ASMANEX HFA) 200 MCG/ACT AERO Inhale 2 puffs into the lungs daily.  01/07/2024 at  8:00 AM   Multiple Vitamin (ONE-A-DAY MENS PO) Take 1 tablet by mouth daily.    01/07/2024 at  8:00 AM   nitroGLYCERIN  (NITRODUR - DOSED IN MG/24 HR) 0.1 mg/hr patch Place 0.1 mg onto the skin daily as needed (chest pain).   01/06/2024 Morning   Omega-3 Fatty Acids (FISH OIL) 1000 MG CAPS Take 1,000 mg by mouth 2 (two) times daily.   01/07/2024 at  8:00 AM   pantoprazole  (PROTONIX ) 40 MG tablet Take 40 mg by mouth daily.   01/07/2024 at  8:00 AM   traZODone (DESYREL) 100 MG tablet Take 100 mg by mouth at bedtime.   01/06/2024 at  9:00 PM    Assessment: 63 YO M with PMH of CAD status post PCI with stent replacement on DAPT (aspirin , Plavix ), HTN, HLD, T2DM, and OSA. Presenting with chest pain. Received Lovenox 52.5 mg (0.5mg /kg treatment dose) last night (11/10, 2200). Plan to transfer to [facility] for left/right heart catherization today  (11/11)  Heparin  resumed post/cath with plans for staged PCI. Heparin  level therapeutic, CBC ok.  Goal of Therapy:  Heparin  level 0.3-0.7 units/ml Monitor platelets by anticoagulation protocol: Yes   Plan:  Continue heparin  1200 units/h Daily heparin  level and CBC  Ozell Jamaica, PharmD, BCPS, [facility] Clinical Pharmacist [phone] Please check AMION for all [facility] Pharmacy numbers 01/09/2024

## 2024-01-09 NOTE — H&P (View-Only) (Signed)
  Progress Note  Patient Name: Greg Alvarez Date of Encounter: 01/09/2024 [facility] Cardiologist: Peter Jordan, MD   Interval Summary    Mild intermittent episodes of chest pain since admission, but not as severe as initial presentation.   Vital Signs Vitals:   01/08/24 2115 01/08/24 2129 01/08/24 2146 01/08/24 2340  BP: 117/80  118/71 107/67  Pulse: 62 63 63 71  Resp: 17 (!) 23 18 16   Temp:   98.2 F (36.8 C) 98.5 F (36.9 C)  TempSrc:   Oral Oral  SpO2: 92% 95% 95% 91%  Weight:   104.8 kg   Height:        Intake/Output Summary (Last 24 hours) at 01/09/2024 0720 Last data filed at 01/08/2024 2341 Gross per 24 hour  Intake 503 ml  Output 800 ml  Net -297 ml      01/08/2024    9:46 PM 01/07/2024    8:04 PM 01/07/2024    6:09 PM  Last 3 Weights  Weight (lbs) 231 lb 0.7 oz 229 lb 4.5 oz 230 lb  Weight (kg) 104.8 kg 104 kg 104.327 kg      Telemetry/ECG   Sinus Rhythm - Personally Reviewed  Physical Exam  GEN: No acute distress.   Neck: No JVD Cardiac: RRR, no murmurs, rubs, or gallops.  Respiratory: Clear to auscultation bilaterally. GI: Soft, nontender, non-distended  MS: No edema VAS: right radial cath site stable   Assessment & Plan   63 y.o. male with a hx of CAD (s/p DES to LAD in 08/2016, cath in 01/2023 with patent LAD stent, stable disease along LCx and overall small in caliber and 95% stenosis along RCA with DESx1 and unsuccessful PCI of the PL branch as the vessel was occluded unable to be crossed with a wire with recommendations to consider repeat PCI of the PDA with atherectomy if refractory angina), HTN, HLD, Type 2 DM and OSA who was seen 01/08/2024 for the evaluation of chest pain at the request of Dr. Willette.  Chest pain CAD s/p DES-LAD '18, DES-RCA '12/24 -- history of complex CAD as outlined above with DES to the LAD in 2018 and cardiac catheterization in 01/2023 with DES to the RCA and unsuccessful PCI of the PL branch and  residual LCx disease  -- presented to [facility] with severe chest pain and shortness of breath, similar to prior angina.  -- Underwent cardiac cath 11/11 with Dr. Elmira, attempted but unsuccessful intervention to RCA. Considering staged intervention with atherectomy pending Cr today  -- continue ASA, plavix , Imdur, atorvastatin, Zetia   HTN -- stable -- home Toprol  was held in the setting of bradycardia with rates in the 40-50s  HLD -- lipids pending -- continue atorvastatin, Zetia , Lovaza   DM -- Hgb A1c 7.4 -- metformin, trulicity and jardiance PTA  For questions or updates, please contact [facility] Please consult [email or web address] for contact info under   Signed, Manuelita Rummer, NP   I have personally seen and examined this patient. I agree with the assessment and plan as outlined above.  PCI of the RCA today per Dr. Jordan. Her is on ASA and Plavix .  Labs reviewed.   Lonni Cash, MD, [facility] 01/09/2024 10:40 AM

## 2024-01-10 ENCOUNTER — Encounter (HOSPITAL_COMMUNITY): Payer: Self-pay | Admitting: Cardiology

## 2024-01-10 DIAGNOSIS — Z9861 Coronary angioplasty status: Secondary | ICD-10-CM

## 2024-01-10 DIAGNOSIS — I2510 Atherosclerotic heart disease of native coronary artery without angina pectoris: Secondary | ICD-10-CM

## 2024-01-10 DIAGNOSIS — R079 Chest pain, unspecified: Secondary | ICD-10-CM | POA: Diagnosis not present

## 2024-01-10 DIAGNOSIS — I10 Essential (primary) hypertension: Secondary | ICD-10-CM | POA: Diagnosis not present

## 2024-01-10 DIAGNOSIS — I200 Unstable angina: Secondary | ICD-10-CM | POA: Diagnosis not present

## 2024-01-10 LAB — CBC
HCT: 37.4 % — ABNORMAL LOW (ref 39.0–52.0)
Hemoglobin: 12.9 g/dL — ABNORMAL LOW (ref 13.0–17.0)
MCH: 31.4 pg (ref 26.0–34.0)
MCHC: 34.5 g/dL (ref 30.0–36.0)
MCV: 91 fL (ref 80.0–100.0)
Platelets: 253 K/uL (ref 150–400)
RBC: 4.11 MIL/uL — ABNORMAL LOW (ref 4.22–5.81)
RDW: 13.2 % (ref 11.5–15.5)
WBC: 9.2 K/uL (ref 4.0–10.5)
nRBC: 0 % (ref 0.0–0.2)

## 2024-01-10 LAB — POCT ACTIVATED CLOTTING TIME
Activated Clotting Time: 325 s
Activated Clotting Time: 924 s
Activated Clotting Time: 193 s

## 2024-01-10 LAB — BASIC METABOLIC PANEL WITH GFR
Anion gap: 9 (ref 5–15)
BUN: 14 mg/dL (ref 8–23)
CO2: 27 mmol/L (ref 22–32)
Calcium: 9 mg/dL (ref 8.9–10.3)
Chloride: 102 mmol/L (ref 98–111)
Creatinine, Ser: 1.25 mg/dL — ABNORMAL HIGH (ref 0.61–1.24)
GFR, Estimated: >60 mL/min (ref >60)
Glucose, Bld: 119 mg/dL — ABNORMAL HIGH (ref 70–99)
Potassium: 4.5 mmol/L (ref 3.5–5.1)
Sodium: 138 mmol/L (ref 135–145)

## 2024-01-10 LAB — GLUCOSE, CAPILLARY
Glucose-Capillary: 136 mg/dL — ABNORMAL HIGH (ref 70–99)
Glucose-Capillary: 139 mg/dL — ABNORMAL HIGH (ref 70–99)

## 2024-01-10 LAB — LIPOPROTEIN A (LPA): Lipoprotein (a): 13.4 nmol/L (ref <75.0)

## 2024-01-10 MED ORDER — LOSARTAN POTASSIUM 25 MG PO TABS: 25.0000 mg | ORAL_TABLET | Freq: Every day | ORAL | 0 refills | Status: AC

## 2024-01-10 MED ORDER — ATORVASTATIN CALCIUM 80 MG PO TABS: 80.0000 mg | ORAL_TABLET | Freq: Every day | ORAL | 0 refills | Status: AC

## 2024-01-10 MED ORDER — ATORVASTATIN CALCIUM 80 MG PO TABS: 80.0000 mg | ORAL_TABLET | Freq: Every day | ORAL | Status: DC

## 2024-01-10 MED ORDER — METOPROLOL SUCCINATE ER 25 MG PO TB24
12.5000 mg | ORAL_TABLET | Freq: Every day | ORAL | Status: DC
  Administered 2024-01-10: 12.5 mg via ORAL
  Filled 2024-01-10: qty 1

## 2024-01-10 MED ORDER — LOSARTAN POTASSIUM 25 MG PO TABS
25.0000 mg | ORAL_TABLET | Freq: Every day | ORAL | Status: DC
  Administered 2024-01-10: 25 mg via ORAL
  Filled 2024-01-10: qty 1

## 2024-01-10 MED ORDER — METOPROLOL SUCCINATE ER 25 MG PO TB24: 12.5000 mg | ORAL_TABLET | Freq: Every day | ORAL | 0 refills | Status: AC

## 2024-01-10 MED ORDER — OXYCODONE HCL 5 MG PO TABS: 5.0000 mg | ORAL_TABLET | Freq: Four times a day (QID) | ORAL | 0 refills | Status: AC | PRN

## 2024-01-10 NOTE — Progress Notes (Signed)
 CARDIAC REHAB PHASE I   PRE:  Rate/Rhythm: 103/ Sinus tach  BP:  Sitting: 109/76      SaO2: 94% RA  MODE:  Ambulation: 470 ft   POST:  Rate/Rhythm: 111/ sinus tach  BP:  Sitting: 127/91      SaO2: 93% RA  Pt tolerated walking well and amb 470 ft  independently. Pt denies CP, SOB, or dizziness throughout walk. Pt was placed back in the bed with call bell in reach. Also, discussed with pt stent placement, DAPT, restrictions, exercise guidelines, DM nutrition, chest pain/ nitroglycerin  use, and CRPII. Referral for [facility] was placed.   [id number] Augustin KATHEE Sharps, RRT, BSRT 01/10/2024 8:40 AM

## 2024-01-10 NOTE — Progress Notes (Addendum)
 Progress Note  Patient Name: Greg Alvarez Date of Encounter: 01/10/2024 [facility] Cardiologist: Peter Jordan, MD   Interval Summary    No chest pain overnight, sitting up eating breakfast this morning.   Vital Signs Vitals:   01/09/24 1900 01/09/24 1935 01/09/24 2332 01/10/24 0449  BP: 117/77 (!) 133/93 115/89 117/81  Pulse: 71 80 93 70  Resp:  18 20 19   Temp:  98.1 F (36.7 C) 98.3 F (36.8 C) 98.1 F (36.7 C)  TempSrc:  Oral Oral Oral  SpO2: 92%  94% 94%  Weight:      Height:        Intake/Output Summary (Last 24 hours) at 01/10/2024 0728 Last data filed at 01/10/2024 0452 Gross per 24 hour  Intake 1164.73 ml  Output 1375 ml  Net -210.27 ml      01/08/2024    9:46 PM 01/07/2024    8:04 PM 01/07/2024    6:09 PM  Last 3 Weights  Weight (lbs) 231 lb 0.7 oz 229 lb 4.5 oz 230 lb  Weight (kg) 104.8 kg 104 kg 104.327 kg      Telemetry/ECG   Sinus Rhythm, rates 70-80s - Personally Reviewed  Physical Exam  GEN: No acute distress.   Neck: No JVD Cardiac: RRR, no murmurs, rubs, or gallops.  Respiratory: Clear to auscultation bilaterally. GI: Soft, nontender, non-distended  MS: No edema VAS: right radial cath site stable, right femoral site with bulky dressing and ecchymosis extending around site. (RN will redress site)  Assessment & Plan   63 y.o. male with a hx of CAD (s/p DES to LAD in 08/2016, cath in 01/2023 with patent LAD stent, stable disease along LCx and overall small in caliber and 95% stenosis along RCA with DESx1 and unsuccessful PCI of the PL branch as the vessel was occluded unable to be crossed with a wire with recommendations to consider repeat PCI of the PDA with atherectomy if refractory angina), HTN, HLD, Type 2 DM and OSA who was seen 01/08/2024 for the evaluation of chest pain at the request of Dr. Willette.   Chest pain CAD s/p DES-LAD '18, DES-RCA '12/24 -- history of complex CAD as outlined above with DES to the LAD in  2018 and cardiac catheterization in 01/2023 with DES to the RCA and unsuccessful PCI of the PL branch and residual LCx disease  -- presented to [facility] with severe chest pain and shortness of breath, similar to prior angina.  -- Underwent cardiac cath 11/11 with Dr. Elmira, attempted but unsuccessful intervention to RCA. Staged intervention on 11/12 with Dr. Jordan, successful scoring balloon angioplasty of the pRCA with 25% residual stenosis. Recommendations for DAPT with ASA/plavix  for one year -- continue ASA, plavix , Imdur, atorvastatin, Zetia    HTN -- stable -- home Toprol  was initially held in the setting of bradycardia with rates in the 40-50s -- will resume Toprol  at 12.5mg  daily and losartan 25mg  daily. He will follow his BP outpatient, can adjust outpatient at needed    HLD -- LDL 57, HDL 30 -- increase atorvastatin to 80mg  daily, continue Zetia , Lovaza    DM -- Hgb A1c 7.4 -- metformin, trulicity and jardiance PTA  Will arrange outpatient follow up  For questions or updates, please contact [facility] Please consult [email or web address] for contact info under        Signed, Manuelita Rummer, NP   Andraya Frigon JONELLE Moose was seen by me today along with Manuelita Rummer, NP. I have personally performed  an evaluation on this patient.  My findings are as follows:  63 y.o. male with history of CAD, HTN, HLD, DM and sleep apnea with unstable angina. Now post cardiac cath and PCI of the severe re-stenotic lesion in the mid RCA treated with scoring balloon angioplasty and drug eluting balloon therapy.  No chest pain today  Data: EKG(s) and pertinent labs, studies, etc were personally reviewed and interpreted by me:  I have personally reviewed the EKG: NSR, old inferior Q waves I have personally reviewed the telemetry: sinus I have personally reviewed the lab data Otherwise, I agree with data as outlined by the advanced practice provider.  Exam performed by me:  Gen: NAD Neck:  No JVD Cardiac: RRR without a murmur Lungs: clear bilaterally Extremities: no LE edema  My Assessment and Plan:  CAD with unstable angina: No chest pain. Doing well. Continue ASA, Plavix , Imdur, statin and Zetia .   HTN: Stable. Continue current therapy  HLD: Continue statin  OK to discharge home today  Signed,  Lonni Cash, MD  01/10/2024 10:01 AM

## 2024-01-10 NOTE — Care Management Important Message (Signed)
 Important Message  Patient Details  Name: Greg Alvarez MRN: [id number] Date of Birth: 24-Jul-1960   Important Message Given:  Yes - Medicare IM     Vonzell Arrie Sharps 01/10/2024, 11:10 AM

## 2024-01-10 NOTE — TOC Transition Note (Signed)
 Transition of Care [facility]) - Discharge Note   Patient Details  Name: Greg Alvarez MRN: [id number] Date of Birth: 09-19-1960  Transition of Care [facility] CM/SW Contact:  Corean JAYSON Canary, RN Phone Number: 01/10/2024, 1:04 PM   Clinical Narrative:     Patient transitioning home today, Cardiac rehab referral sent for [facility] [place] [facility] They will reach out to patient  Final next level of care: Home/Self Care Barriers to Discharge: No Barriers Identified   Patient Goals and CMS Choice            Discharge Placement                       Discharge Plan and Services Additional resources added to the After Visit Summary for                                       Social Drivers of Health (SDOH) Interventions SDOH Screenings   Food Insecurity: No Food Insecurity (01/07/2024)  Housing: Low Risk  (01/07/2024)  Transportation Needs: No Transportation Needs (01/07/2024)  Utilities: Not At Risk (01/07/2024)  Social Connections: Unknown (01/07/2024)  Tobacco Use: Medium Risk (01/07/2024)     Readmission Risk Interventions     No data to display

## 2024-01-10 NOTE — Discharge Summary (Signed)
 Physician Discharge Summary  Greg Alvarez FMW:[id number] DOB: [birth year] DOA: 01/07/2024  PCP: Dasie Perkins, PA  Admit date: 01/07/2024 Discharge date: 01/10/2024  Admitted From: Home Disposition: Home  Recommendations for Outpatient Follow-up:  Follow up with cardiology in 1-2 weeks Cardiac rehab Follow up in ED if symptoms worsen or new appear   Home Health: No Equipment/Devices: None  Discharge Condition: Stable CODE STATUS: Full Diet recommendation: Heart healthy  Brief/Interim Summary: Greg Alvarez is a 63 y.o. male with medical history significant for coronary artery disease status post PCI with stent placement ( LAD in 2018 and RCA 2024)on DAPT aspirin  Plavix , hypertension, hyperlipidemia, type 2 diabetes, OSA, who presents to the ER due to complaints of left-sided chest pain.     This started on Friday, 4 days ago and has been intermittent.  Took a nitroglycerin  2 days ago without improvement.   ER, 12 EKG showing sinus rhythm with first-degree AV block rate of 61.  QTc 442.  High-sensitivity troponin negative x 2.  States he has been compliant with his cardiac medications.    Pt was admitted for unstable angina.    Underwent cardiac cath 11/11 with Dr. Elmira, attempted but unsuccessful intervention to RCA. Staged intervention on 11/12 with Dr. Jordan, successful scoring balloon angioplasty of the pRCA with 25% residual stenosis. Recommendations for DAPT with ASA/plavix  for one year. Continue imdur.  Pt was discharged on 11/13.   Type 2 diabetes, blood sugar is controlled Home medication includes metformin/Trulicity and Jardiance   Hyperlipidemia - continue Zetia  and atorvastatin  HTN: metoprolol  decreased to 12.5 daily due to bradycardia. Amlodipine discontinued and cozaar reduced to 25 daily to avoid hypotension    GERD -continue PPI     Insomnia - Resume home trazodone   OSA CPAP nightly  Discharge Diagnoses:  Principal Problem:   Chest  pain Active Problems:   Unstable angina (HCC)   CAD S/P percutaneous coronary angioplasty   Hyperlipidemia   Obstructive sleep apnea   Benign essential hypertension   Diabetes mellitus (HCC)   Obesity    Discharge Instructions  Discharge Instructions     AMB Referral to Cardiac Rehabilitation - Phase II   Complete by: As directed    Diagnosis: Coronary Stents   After initial evaluation and assessments completed: Virtual Based Care may be provided alone or in conjunction with Phase 2 Cardiac Rehab based on patient barriers.: Yes   Intensive Cardiac Rehabilitation (ICR) [facility] location only OR Traditional Cardiac Rehabilitation (TCR) *If criteria for ICR are not met will enroll in TCR ([facility] only): Yes   Amb Referral to Cardiac Rehabilitation   Complete by: As directed    Diagnosis: Coronary Stents   After initial evaluation and assessments completed: Virtual Based Care may be provided alone or in conjunction with Phase 2 Cardiac Rehab based on patient barriers.: Yes   Intensive Cardiac Rehabilitation (ICR) [facility] location only OR Traditional Cardiac Rehabilitation (TCR) *If criteria for ICR are not met will enroll in TCR [facility] only): Yes   Call MD for:  redness, tenderness, or signs of infection (pain, swelling, redness, odor or green/yellow discharge around incision site)   Complete by: As directed    Call MD for:  severe uncontrolled pain   Complete by: As directed    Diet - low sodium heart healthy   Complete by: As directed    Discharge instructions   Complete by: As directed    1.  Continue aspirin  and Plavix  for 12 months.  Doses  of metoprolol , atorvastatin, Cozaar have been changed.  Please review the medication changes. 2.  Follow-up with cardiology as scheduled. 3.  Return to the ER if recurrence of symptoms   Increase activity slowly   Complete by: As directed       Allergies as of 01/10/2024       Reactions   Ativan [lorazepam] Other (See Comments)   Hyperactivity     Benazepril Cough        Medication List     STOP taking these medications    amLODipine 5 MG tablet Commonly known as: NORVASC       TAKE these medications    Asmanex HFA 200 MCG/ACT Aero Generic drug: Mometasone Furoate Inhale 2 puffs into the lungs daily.   aspirin  81 MG chewable tablet Chew 81 mg by mouth daily.   atorvastatin 80 MG tablet Commonly known as: LIPITOR Take 1 tablet (80 mg total) by mouth at bedtime. What changed:  medication strength how much to take   bismuth subsalicylate 262 MG/15ML suspension Commonly known as: PEPTO BISMOL Take 30 mLs by mouth every 6 (six) hours as needed for indigestion or diarrhea or loose stools.   clopidogrel  75 MG tablet Commonly known as: PLAVIX  TAKE 1 TABLET EVERY DAY WITH BREAKFAST   empagliflozin 25 MG Tabs tablet Commonly known as: JARDIANCE Take 12.5 mg by mouth daily.   escitalopram 10 MG tablet Commonly known as: LEXAPRO Take 10 mg by mouth daily.   ezetimibe  10 MG tablet Commonly known as: ZETIA  TAKE 1 TABLET EVERY DAY   Fish Oil 1000 MG Caps Take 1,000 mg by mouth 2 (two) times daily.   hydrOXYzine 25 MG tablet Commonly known as: ATARAX Take 25 mg by mouth 3 (three) times daily as needed for anxiety.   isosorbide mononitrate 30 MG 24 hr tablet Commonly known as: IMDUR Take 30 mg by mouth daily.   losartan 25 MG tablet Commonly known as: COZAAR Take 1 tablet (25 mg total) by mouth daily. Start taking on: January 11, 2024 What changed:  medication strength how much to take   metFORMIN 500 MG 24 hr tablet Commonly known as: GLUCOPHAGE-XR Take 500 mg by mouth in the morning and at bedtime.   metoprolol  succinate 25 MG 24 hr tablet Commonly known as: TOPROL -XL Take 0.5 tablets (12.5 mg total) by mouth daily. Start taking on: January 11, 2024 What changed: how much to take   nitroGLYCERIN  0.1 mg/hr patch Commonly known as: NITRODUR - Dosed in mg/24 hr Place 0.1 mg onto the skin daily  as needed (chest pain).   ONE-A-DAY MENS PO Take 1 tablet by mouth daily.   oxyCODONE  5 MG immediate release tablet Commonly known as: Oxy IR/ROXICODONE  Take 1-2 tablets (5-10 mg total) by mouth every 6 (six) hours as needed for up to 3 days for moderate pain (pain score 4-6) or breakthrough pain.   pantoprazole  40 MG tablet Commonly known as: PROTONIX  Take 40 mg by mouth daily.   traZODone 100 MG tablet Commonly known as: DESYREL Take 100 mg by mouth at bedtime.   Trulicity 1.5 MG/0.5ML Soaj Generic drug: Dulaglutide Inject 1.5 mg into the skin every Monday.        Follow-up Information     Dasie Perkins, [place]. Schedule an appointment as soon as possible for a visit in 1 week(s).   Specialty: Physician Assistant Contact information: [address] [place] [address] [phone]         Verlin Lonni BIRCH, MD.  Schedule an appointment as soon as possible for a visit in 1 week(s).   Specialty: Cardiology Contact information: [address] [place] [address] [phone]                Allergies  Allergen Reactions   Ativan [Lorazepam] Other (See Comments)    Hyperactivity    Benazepril Cough    Consultations:    Procedures/Studies: CARDIAC CATHETERIZATION Result Date: 01/09/2024   Prox RCA lesion is 90% stenosed.   Scoring balloon angioplasty was performed using a BALLOON WOLVERINE 4.00X10.   Post intervention, there is a 25% residual stenosis.   Recommend uninterrupted dual antiplatelet therapy with Aspirin  81mg  daily and Clopidogrel  75mg  daily for a minimum of 12 months (ACS-Class I recommendation). Successful PCI of the proximal RCA for instent restenosis with progressive balloon dilation, cutting balloon, and Drug coated balloon treatment.   CARDIAC CATHETERIZATION Result Date: 01/08/2024 Coronary angiography 01/08/2024: LM: Normal LAD: Mild diffuse disease and mild late lumen loss in mid LAD stent Ramus: Small, diffuse 70%  disease LCx: Mid focal 95% calcific stenosis with small distal vessel to that RCA: Large dominant vessel          Prox 90% ISR          70% ostial RPDA disease Unsuccessful percutaneous coronary intervention prox RCA Consider staged atherectomy tomorrow Unusually delayed procedure time due to anatomic difficulties requiring multiple guide catheters, guideliner, multiple balloons, multiple wires Newman JINNY Lawrence, MD  ECHOCARDIOGRAM COMPLETE Result Date: 01/08/2024    ECHOCARDIOGRAM REPORT   Patient Name:   ZAIDIN BLYDEN Date of Exam: 01/08/2024 Medical Rec #:  [id number]      Height:       66.0 in Accession #:    [id number]     Weight:       229.3 lb Date of Birth:  02-10-1961       BSA:          2.119 m Patient Age:    63 years       BP:           113/69 mmHg Patient Gender: M              HR:           57 bpm. Exam Location:  [facility] Procedure: 2D Echo, 3D Echo, Cardiac Doppler, Color Doppler and Strain Analysis            (Both Spectral and Color Flow Doppler were utilized during            procedure). Indications:    Chest Pain  History:        Patient has no prior history of Echocardiogram examinations.                 CAD; Risk Factors:Hypertension, Diabetes, Dyslipidemia and Sleep                 Apnea.  Sonographer:    Philomena Daring Referring Phys: [id number] CAROLE N HALL  Sonographer Comments: Global longitudinal strain was attempted. IMPRESSIONS  1. Left ventricular ejection fraction, by estimation, is 50 to 55%. Left ventricular ejection fraction by 3D volume is 54 %. The left ventricle has low normal function. Left ventricular endocardial border not optimally defined to evaluate regional wall motion. Left ventricular diastolic parameters are consistent with Grade I diastolic dysfunction (impaired relaxation). The average left ventricular global longitudinal strain is -16.7 %. The global longitudinal strain is normal.  2. Right ventricular systolic  function is normal. The right ventricular size is  normal. Tricuspid regurgitation signal is inadequate for assessing PA pressure.  3. The mitral valve is normal in structure. No evidence of mitral valve regurgitation. No evidence of mitral stenosis.  4. The aortic valve is tricuspid. Aortic valve regurgitation is mild. No aortic stenosis is present.  5. The inferior vena cava is dilated in size with >50% respiratory variability, suggesting right atrial pressure of 8 mmHg. Comparison(s): No prior Echocardiogram. FINDINGS  Left Ventricle: Left ventricular ejection fraction, by estimation, is 50 to 55%. Left ventricular ejection fraction by 3D volume is 54 %. The left ventricle has low normal function. Left ventricular endocardial border not optimally defined to evaluate regional wall motion. The average left ventricular global longitudinal strain is -16.7 %. Strain was performed and the global longitudinal strain is normal. The left ventricular internal cavity size was normal in size. There is no left ventricular hypertrophy. Left ventricular diastolic parameters are consistent with Grade I diastolic dysfunction (impaired relaxation). Normal left ventricular filling pressure. Right Ventricle: The right ventricular size is normal. No increase in right ventricular wall thickness. Right ventricular systolic function is normal. Tricuspid regurgitation signal is inadequate for assessing PA pressure. Left Atrium: Left atrial size was normal in size. Right Atrium: Right atrial size was normal in size. Pericardium: There is no evidence of pericardial effusion. Mitral Valve: The mitral valve is normal in structure. No evidence of mitral valve regurgitation. No evidence of mitral valve stenosis. Tricuspid Valve: The tricuspid valve is normal in structure. Tricuspid valve regurgitation is not demonstrated. No evidence of tricuspid stenosis. Aortic Valve: The aortic valve is tricuspid. Aortic valve regurgitation is mild. No aortic stenosis is present. Pulmonic Valve: The  pulmonic valve was grossly normal. Pulmonic valve regurgitation is trivial. No evidence of pulmonic stenosis. Aorta: The aortic root is normal in size and structure. Venous: The inferior vena cava is dilated in size with greater than 50% respiratory variability, suggesting right atrial pressure of 8 mmHg. IAS/Shunts: No atrial level shunt detected by color flow Doppler. Additional Comments: 3D was performed not requiring image post processing on an independent workstation and was normal.  LEFT VENTRICLE PLAX 2D LVIDd:         4.70 cm         Diastology LVIDs:         3.80 cm         LV e' medial:    7.07 cm/s LV PW:         1.00 cm         LV E/e' medial:  12.0 LV IVS:        1.10 cm         LV e' lateral:   9.36 cm/s LVOT diam:     2.10 cm         LV E/e' lateral: 9.1 LV SV:         85 LV SV Index:   40              2D Longitudinal LVOT Area:     3.46 cm        Strain                                2D Strain GLS   -16.7 %  Avg: LV Volumes (MOD) LV vol d, MOD    108.0 ml      3D Volume EF A2C:                           LV 3D EF:    Left LV vol d, MOD    119.0 ml                   ventricul A4C:                                        ar LV vol s, MOD    48.3 ml                    ejection A2C:                                        fraction LV vol s, MOD    51.8 ml                    by 3D A4C:                                        volume is LV SV MOD A2C:   59.7 ml                    54 %. LV SV MOD A4C:   119.0 ml LV SV MOD BP:    64.2 ml                                3D Volume EF:                                3D EF:        54 %                                LV EDV:       149 ml                                LV ESV:       68 ml                                LV SV:        81 ml RIGHT VENTRICLE             IVC RV Basal diam:  2.80 cm     IVC diam: 2.10 cm RV Mid diam:    2.20 cm RV S prime:     11.30 cm/s TAPSE (M-mode): 2.1 cm LEFT ATRIUM             Index        RIGHT ATRIUM            Index LA diam:  3.80 cm 1.79 cm/m   RA Area:     12.90 cm LA Vol (A2C):   57.5 ml 27.13 ml/m  RA Volume:   27.00 ml  12.74 ml/m LA Vol (A4C):   40.6 ml 19.16 ml/m LA Biplane Vol: 51.2 ml 24.16 ml/m  AORTIC VALVE LVOT Vmax:   101.00 cm/s LVOT Vmean:  64.700 cm/s LVOT VTI:    0.244 m  AORTA Ao Root diam: 3.30 cm MITRAL VALVE MV Area (PHT): 3.37 cm     SHUNTS MV Decel Time: 225 msec     Systemic VTI:  0.24 m MV E velocity: 84.90 cm/s   Systemic Diam: 2.10 cm MV A velocity: 110.00 cm/s MV E/A ratio:  0.77 Vishnu Priya Mallipeddi Electronically signed by Diannah Late Mallipeddi Signature Date/Time: 01/08/2024/10:52:12 AM    Final    DG Chest Port 1 View Result Date: 01/07/2024 CLINICAL DATA:  Chest pain EXAM: PORTABLE CHEST 1 VIEW COMPARISON:  Chest x-ray 12/04/2023 FINDINGS: The heart size and mediastinal contours are within normal limits. Both lungs are clear. The visualized skeletal structures are unremarkable. IMPRESSION: No active disease. Electronically Signed   By: Greig Pique M.D.   On: 01/07/2024 19:00      Subjective:   Discharge Exam: Vitals:   01/10/24 0734 01/10/24 1133  BP: 109/76 114/72  Pulse: 72   Resp: 17 20  Temp: 97.8 F (36.6 C) 98.1 F (36.7 C)  SpO2: 97%     General: Pt is alert, awake, not in acute distress Cardiovascular: rate controlled, S1/S2 + Respiratory: bilateral decreased breath sounds at bases Abdominal: Soft, NT, ND, bowel sounds + Extremities: no edema, no cyanosis    The results of significant diagnostics from this hospitalization (including imaging, microbiology, ancillary and laboratory) are listed below for reference.     Microbiology: No results found for this or any previous visit (from the past 240 hours).   Labs: BNP (last 3 results) No results for input(s): BNP in the last 8760 hours. Basic Metabolic Panel: Recent Labs  Lab 01/07/24 1820 01/08/24 0414 01/09/24 0735 01/09/24 1803 01/10/24 0416  NA 141 141  143  --  138  K 4.3 4.3 4.3  --  4.5  CL 102 103 103  --  102  CO2 27 28 28   --  27  GLUCOSE 114* 114* 114*  --  119*  BUN 16 15 14   --  14  CREATININE 0.90 0.88 1.00 0.96 1.25*  CALCIUM 9.8 9.1 9.5  --  9.0  MG  --  2.2  --   --   --   PHOS  --  3.1  --   --   --    Liver Function Tests: No results for input(s): AST, ALT, ALKPHOS, BILITOT, PROT, ALBUMIN in the last 168 hours. No results for input(s): LIPASE, AMYLASE in the last 168 hours. No results for input(s): AMMONIA in the last 168 hours. CBC: Recent Labs  Lab 01/07/24 1820 01/08/24 0414 01/09/24 0735 01/09/24 1803 01/10/24 0416  WBC 9.5 9.4 8.8 10.0 9.2  HGB 15.3 14.2 14.2 13.5 12.9*  HCT 45.0 41.6 41.6 38.6* 37.4*  MCV 91.8 91.6 91.6 90.2 91.0  PLT 279 256 261 253 253   Cardiac Enzymes: No results for input(s): CKTOTAL, CKMB, CKMBINDEX, TROPONINI in the last 168 hours. BNP: Invalid input(s): POCBNP CBG: Recent Labs  Lab 01/09/24 0754 01/09/24 1148 01/09/24 1939 01/10/24 0739 01/10/24 1132  GLUCAP 99 117* 158* 136* 139*   D-Dimer No results  for input(s): DDIMER in the last 72 hours. Hgb A1c Recent Labs    01/08/24 0414  HGBA1C 7.4*   Lipid Profile Recent Labs    01/09/24 0735  CHOL 114  HDL 30*  LDLCALC 57  TRIG 863  CHOLHDL 3.8   Thyroid function studies No results for input(s): TSH, T4TOTAL, T3FREE, THYROIDAB in the last 72 hours.  Invalid input(s): FREET3 Anemia work up No results for input(s): VITAMINB12, FOLATE, FERRITIN, TIBC, IRON, RETICCTPCT in the last 72 hours. Urinalysis    Component Value Date/Time   COLORURINE YELLOW 03/10/2008 2149   APPEARANCEUR CLEAR 03/10/2008 2149   LABSPEC 1.019 03/10/2008 2149   PHURINE 6.0 03/10/2008 2149   GLUCOSEU NEGATIVE 03/10/2008 2149   HGBUR TRACE (A) 03/10/2008 2149   BILIRUBINUR NEGATIVE 03/10/2008 2149   KETONESUR NEGATIVE 03/10/2008 2149   PROTEINUR NEGATIVE 03/10/2008 2149    UROBILINOGEN 0.2 03/10/2008 2149   NITRITE NEGATIVE 03/10/2008 2149   LEUKOCYTESUR NEGATIVE 03/10/2008 2149   Sepsis Labs Recent Labs  Lab 01/08/24 0414 01/09/24 0735 01/09/24 1803 01/10/24 0416  WBC 9.4 8.8 10.0 9.2   Microbiology No results found for this or any previous visit (from the past 240 hours).   Time coordinating discharge: 35 minutes  SIGNED:   Harneet Noblett, MD  [facility] 01/10/2024, 6:18 PM

## 2024-01-19 NOTE — Progress Notes (Unsigned)
 Cardiology Office Note:  .   Date:  01/21/2024  ID:  Greg Alvarez, DOB 1960-06-16, MRN [id number] PCP: Dasie Perkins, PA  [facility] Providers Cardiologist:  Peter Jordan, MD {  History of Present Illness: .   Greg Alvarez is a 63 y.o. male  with PMHx of CAD (s/p DES to LAD in 08/2016, DES to RCA in 01/2023, unsuccessful PCI to PDA & PL branch of RCA in 01/2023, Unsuccessful PCI to prox RCA with successful staged balloon angioplasty of the pRCA with 25% residual stenosis in 12/2023), HTN, HLD, Type 2 DM and OSA who reports to [facility] office for hospital follow up.   Recent hospitalization 11/10-13/2025 admitted for unstable angina. Underwent cardiac cath 11/11 with Dr. Elmira, attempted but unsuccessful intervention to RCA. Staged intervention on 11/12 with Dr. Jordan, successful scoring balloon angioplasty of the pRCA with 25% residual stenosis. Recommendations for DAPT with ASA/plavix  for one year. Continued on ASA 81 mg daily, Lipitor 80 mg daily, Plavix  75 mg daily, Zetia  10 mg daily, Imdur  30 mg daily, Losartan  25 mg daily, Toprol  XL 25 mg daily, NTG patch PRN.   Today, he express concern for bruising and pain related to groin cath site report. Although he has history of back pain, reports that on Saturday he had a new lower right back described as sharp stabbing sensation for approx 10-15 mins with no recurrence. Also reports pulling sensation down his right legs as well. Notes chronic unchanged SOB that can occur at rest and with minimal exertion. Also reports chronic unchanged dizziness that was previously evaluated by neurology with negative workup. Denies chest pain, palpitations, syncope, presyncope, orthopnea, PND, swelling or significant weight changes, acute bleeding, or claudication.  Reports compliance with medications. He has changed his diet to low carb, heart healthy diet. He walks 30 mins daily without any angina symptoms. Denies tobacco use/alcohol/drug use    ROS: 10 point review of system has been reviewed and considered negative except ones been listed in the HPI.   Studies Reviewed: SABRA   EKG Interpretation Date/Time:  Monday January 21 2024 14:31:54 EST Ventricular Rate:  68 PR Interval:  214 QRS Duration:  94 QT Interval:  406 QTC Calculation: 431 R Axis:   12  Text Interpretation: Sinus rhythm with 1st degree A-V block Inferior infarct (cited on or before 07-Jan-2024) When compared with ECG of 10-Jan-2024 05:05, No significant change was found Confirmed by Sheron Hallmark ([id number]) on 01/21/2024 3:16:09 PM  ECHO IMPRESSIONS 01/08/2024  1. Left ventricular ejection fraction, by estimation, is 50 to 55%. Left ventricular ejection fraction by 3D volume is 54 %. The left ventricle has low normal function. Left ventricular endocardial border not optimally defined to evaluate regional wall  motion. Left ventricular diastolic parameters are consistent with Grade I diastolic dysfunction (impaired relaxation). The average left ventricular global longitudinal strain is -16.7 %. The global longitudinal strain is normal.   2. Right ventricular systolic function is normal. The right ventricular size is normal. Tricuspid regurgitation signal is inadequate for assessing PA pressure.   3. The mitral valve is normal in structure. No evidence of mitral valve regurgitation. No evidence of mitral stenosis.   4. The aortic valve is tricuspid. Aortic valve regurgitation is mild. No aortic stenosis is present.   5. The inferior vena cava is dilated in size with >50% respiratory variability, suggesting right atrial pressure of 8 mmHg.   Comparison(s): No prior Echocardiogram.   Coronary angiography 01/08/2024: LM: Normal LAD: Mild  diffuse disease and mild late lumen loss in mid LAD stent Ramus: Small, diffuse 70% disease LCx: Mid focal 95% calcific stenosis with small distal vessel to that RCA: Large dominant vessel          Prox 90% ISR          70%  ostial RPDA disease   Unsuccessful percutaneous coronary intervention prox RCA Consider staged atherectomy tomorrow   Unusually delayed procedure time due to anatomic difficulties requiring multiple guide catheters, guideliner, multiple balloons, multiple wires  CORONARY BALLOON ANGIOPLASTY 01/09/2024   Prox RCA lesion is 90% stenosed.   Scoring balloon angioplasty was performed using a BALLOON WOLVERINE 4.00X10.   Post intervention, there is a 25% residual stenosis.   Recommend uninterrupted dual antiplatelet therapy with Aspirin  81mg  daily and Clopidogrel  75mg  daily for a minimum of 12 months (ACS-Class I recommendation).   Successful PCI of the proximal RCA for instent restenosis with progressive balloon dilation, cutting balloon, and Drug coated balloon treatment.  Diagnostic Dominance: Right  Intervention    Physical Exam:   VS:  BP 112/78 (BP Location: Right Arm, Patient Position: Sitting, Cuff Size: Normal)   Pulse 68   Ht 5' 7 (1.702 m)   Wt 227 lb 9.6 oz (103.2 kg)   SpO2 95%   BMI 35.65 kg/m    Wt Readings from Last 3 Encounters:  01/21/24 227 lb 9.6 oz (103.2 kg)  01/08/24 231 lb 0.7 oz (104.8 kg)  12/04/23 230 lb (104.3 kg)    GEN: Well nourished, well developed in no acute distress while sitting in chair.  NECK: No JVD; No carotid bruits CARDIAC: RRR, no murmurs, rubs, gallops RESPIRATORY:  Clear to auscultation without rales, wheezing or rhonchi  ABDOMEN: Soft, non-tender, non-distended EXTREMITIES:  No edema; No deformity; ecchymosis in right suprapubic area, inner right thigh, outer right hip and lower right back not associated with pain upon palpitation. No hematoma.   ASSESSMENT AND PLAN: .   Coronary artery disease of native artery of native heart with stable angina pectoris S/P coronary artery stent placement Hyperlipidemia LDL goal <70 s/p DES to LAD in 08/2016, DES to RCA in 01/2023, unsuccessful PCI to PDA & PL branch of RCA in 01/2023,  Unsuccessful PCI to prox RCA with successful staged balloon angioplasty of the pRCA with 25% residual stenosis in 12/2023 EKG today without ischemic changes.  Denies angina symptoms. No need for further ischemic evaluation at this time.  Reports bruising and pain related to groin cath site report. Although he has history of back pain, reports that on Saturday he had a new lower right back described as sharp stabbing sensation for approx 10-15 mins with no recurrence. Also reports pulling sensation down his right legs as well.  Groin cath site evaluated by Scot Ford, PA and I. Exam revealed ecchymosis in right suprapubic area, inner right thigh, outer right hip and lower right back not associated with pain upon palpitation. No hematoma. Order CT Abd and Pelvis to r/o retroperitoneal bleed and pseudoaneurysm.  Order CBC to assess for anemia.  K/Cr WNL and LDL 57 in 12/2023.  Continue ASA 81 mg, Lipitor 80 mg daily, Plavix  75 mg daily, Zetia  10 mg daily, Imdur  30 mg daily, Losartan  25 mg daily, Toprol  XL 25 mg daily, NTG patch PRN.   Primary hypertension Reports well controlled Home BP: 110-120/70's BP this OV well controlled today: 112/78 Continue on Imdur  30 mg daily, Losartan  25 mg daily, Toprol  XL 25 mg daily Encourage physical  activity for 150 minutes per week and heart healthy low sodium diet. Discussed limiting sodium intake to < 2 grams daily.     Type 2 diabetes mellitus without complication, without long-term current use of insulin  (HCC) 12/2023 A1c 7.4 Managed by PCP     Dispo: Follow up in 3 months with Dr. Jordan  Signed, Lorette CINDERELLA Kapur, PA-C

## 2024-01-21 ENCOUNTER — Encounter: Payer: Self-pay | Admitting: Physician Assistant

## 2024-01-21 ENCOUNTER — Ambulatory Visit: Attending: Physician Assistant | Admitting: Physician Assistant

## 2024-01-21 VITALS — BP 112/78 | HR 68 | Ht 67.0 in | Wt 227.6 lb

## 2024-01-21 DIAGNOSIS — E119 Type 2 diabetes mellitus without complications: Secondary | ICD-10-CM

## 2024-01-21 DIAGNOSIS — I25118 Atherosclerotic heart disease of native coronary artery with other forms of angina pectoris: Secondary | ICD-10-CM | POA: Diagnosis not present

## 2024-01-21 DIAGNOSIS — I10 Essential (primary) hypertension: Secondary | ICD-10-CM

## 2024-01-21 DIAGNOSIS — E785 Hyperlipidemia, unspecified: Secondary | ICD-10-CM

## 2024-01-21 DIAGNOSIS — Z955 Presence of coronary angioplasty implant and graft: Secondary | ICD-10-CM | POA: Diagnosis not present

## 2024-01-21 DIAGNOSIS — R1084 Generalized abdominal pain: Secondary | ICD-10-CM | POA: Diagnosis not present

## 2024-01-21 DIAGNOSIS — G4733 Obstructive sleep apnea (adult) (pediatric): Secondary | ICD-10-CM

## 2024-01-21 LAB — CBC
Hematocrit: 36.5 % — ABNORMAL LOW (ref 37.5–51.0)
Hemoglobin: 12.1 g/dL — ABNORMAL LOW (ref 13.0–17.7)
MCH: 31.7 pg (ref 26.6–33.0)
MCHC: 33.2 g/dL (ref 31.5–35.7)
MCV: 96 fL (ref 79–97)
Platelets: 351 x10E3/uL (ref 150–450)
RBC: 3.82 x10E6/uL — ABNORMAL LOW (ref 4.14–5.80)
RDW: 13.6 % (ref 11.6–15.4)
WBC: 8.5 x10E3/uL (ref 3.4–10.8)

## 2024-01-21 NOTE — Patient Instructions (Signed)
 Medication Instructions:  Your physician recommends that you continue on your current medications as directed. Please refer to the Current Medication list given to you today.  *If you need a refill on your cardiac medications before your next appointment, please call your pharmacy*  Lab Work: Today- CBC  If you have labs (blood work) drawn today and your tests are completely normal, you will receive your results only by: MyChart Message (if you have MyChart) OR A paper copy in the mail If you have any lab test that is abnormal or we need to change your treatment, we will call you to review the results.  Testing/Procedures: Non-Cardiac CT Angiography (CTA) abdomen/pelvis, is a special type of CT scan that uses a computer to produce multi-dimensional views of major blood vessels throughout the body. In CT angiography, a contrast material is injected through an IV to help visualize the blood vessels   Follow-Up: At [facility], you and your health needs are our priority.  As part of our continuing mission to provide you with exceptional heart care, our providers are all part of one team.  This team includes your primary Cardiologist (physician) and Advanced Practice Providers or APPs (Physician Assistants and Nurse Practitioners) who all work together to provide you with the care you need, when you need it.  Your next appointment:   2-3 month(s)  Provider:   Peter Jordan, MD or Scot Ford, PA-C

## 2024-01-22 ENCOUNTER — Ambulatory Visit (HOSPITAL_COMMUNITY)
Admission: RE | Admit: 2024-01-22 | Discharge: 2024-01-22 | Disposition: A | Discharge status: Home / Self Care | Source: Ambulatory Visit | Attending: Physician Assistant | Admitting: Physician Assistant

## 2024-01-22 ENCOUNTER — Ambulatory Visit: Payer: Self-pay | Admitting: Physician Assistant

## 2024-01-22 DIAGNOSIS — R1084 Generalized abdominal pain: Secondary | ICD-10-CM | POA: Diagnosis present

## 2024-01-22 MED ORDER — IOHEXOL 350 MG/ML SOLN
100.0000 mL | Freq: Once | INTRAVENOUS | Status: AC | PRN
  Administered 2024-01-22: 100 mL via INTRAVENOUS

## 2024-04-01 ENCOUNTER — Telehealth: Payer: Self-pay | Admitting: Cardiology

## 2024-04-01 NOTE — Telephone Encounter (Signed)
 Patient wants to get a disc made of his CT ABDOMEN AND PELVIS WITHOUT AND WITH CONTRAST made to be sent to the [place].  Patient wants a call back.

## 2024-04-14 ENCOUNTER — Ambulatory Visit: Admitting: Cardiology
